# Patient Record
Sex: Female | Born: 1945 | Race: White | Hispanic: No | Smoking: Current some day smoker
Health system: Southern US, Community
[De-identification: ages and names within clinical notes are randomized; demographics above are authoritative.]

## PROBLEM LIST (undated history)

## (undated) DIAGNOSIS — Q211 Atrial septal defect, unspecified: Secondary | ICD-10-CM

## (undated) DIAGNOSIS — I255 Ischemic cardiomyopathy: Secondary | ICD-10-CM

## (undated) DIAGNOSIS — I219 Acute myocardial infarction, unspecified: Secondary | ICD-10-CM

## (undated) DIAGNOSIS — E119 Type 2 diabetes mellitus without complications: Secondary | ICD-10-CM

## (undated) DIAGNOSIS — I739 Peripheral vascular disease, unspecified: Secondary | ICD-10-CM

## (undated) DIAGNOSIS — E785 Hyperlipidemia, unspecified: Secondary | ICD-10-CM

## (undated) DIAGNOSIS — K759 Inflammatory liver disease, unspecified: Secondary | ICD-10-CM

## (undated) DIAGNOSIS — I2510 Atherosclerotic heart disease of native coronary artery without angina pectoris: Secondary | ICD-10-CM

## (undated) DIAGNOSIS — I10 Essential (primary) hypertension: Secondary | ICD-10-CM

## (undated) DIAGNOSIS — I509 Heart failure, unspecified: Secondary | ICD-10-CM

## (undated) DIAGNOSIS — Z87898 Personal history of other specified conditions: Secondary | ICD-10-CM

## (undated) DIAGNOSIS — Z8572 Personal history of non-Hodgkin lymphomas: Secondary | ICD-10-CM

## (undated) DIAGNOSIS — Z8541 Personal history of malignant neoplasm of cervix uteri: Secondary | ICD-10-CM

## (undated) HISTORY — DX: Hyperlipidemia, unspecified: E78.5

## (undated) HISTORY — PX: ASD AND VSD REPAIR: SHX257

## (undated) HISTORY — DX: Personal history of malignant neoplasm of cervix uteri: Z85.41

## (undated) HISTORY — DX: Atherosclerotic heart disease of native coronary artery without angina pectoris: I25.10

## (undated) HISTORY — DX: Atrial septal defect, unspecified: Q21.10

## (undated) HISTORY — DX: Personal history of non-Hodgkin lymphomas: Z85.72

## (undated) HISTORY — DX: Essential (primary) hypertension: I10

## (undated) HISTORY — PX: VEIN SURGERY: SHX48

## (undated) HISTORY — DX: Personal history of other specified conditions: Z87.898

## (undated) HISTORY — DX: Atrial septal defect: Q21.1

---

## 1969-06-14 DIAGNOSIS — K759 Inflammatory liver disease, unspecified: Secondary | ICD-10-CM

## 1969-06-14 HISTORY — DX: Inflammatory liver disease, unspecified: K75.9

## 2000-06-14 DIAGNOSIS — Z87898 Personal history of other specified conditions: Secondary | ICD-10-CM

## 2000-06-14 HISTORY — DX: Personal history of other specified conditions: Z87.898

## 2007-07-14 ENCOUNTER — Ambulatory Visit: Payer: Self-pay | Admitting: Cardiovascular Disease

## 2007-07-14 ENCOUNTER — Encounter: Payer: Self-pay | Admitting: Cardiovascular Disease

## 2007-07-14 ENCOUNTER — Ambulatory Visit: Payer: Self-pay | Admitting: Cardiology

## 2007-07-14 ENCOUNTER — Inpatient Hospital Stay (HOSPITAL_COMMUNITY): Admission: EM | Admit: 2007-07-14 | Discharge: 2007-07-16 | Payer: Self-pay | Admitting: Cardiovascular Disease

## 2007-08-18 ENCOUNTER — Ambulatory Visit: Payer: Self-pay | Admitting: Cardiology

## 2007-09-05 ENCOUNTER — Ambulatory Visit: Payer: Self-pay | Admitting: Cardiology

## 2007-11-08 ENCOUNTER — Ambulatory Visit: Payer: Self-pay | Admitting: Cardiology

## 2008-03-06 ENCOUNTER — Ambulatory Visit: Payer: Self-pay | Admitting: Cardiology

## 2008-04-29 ENCOUNTER — Ambulatory Visit: Payer: Self-pay | Admitting: Cardiology

## 2008-07-03 ENCOUNTER — Ambulatory Visit: Payer: Self-pay | Admitting: Cardiology

## 2009-01-01 DIAGNOSIS — I10 Essential (primary) hypertension: Secondary | ICD-10-CM

## 2009-01-01 DIAGNOSIS — E785 Hyperlipidemia, unspecified: Secondary | ICD-10-CM

## 2009-01-01 DIAGNOSIS — Z87898 Personal history of other specified conditions: Secondary | ICD-10-CM

## 2009-01-01 DIAGNOSIS — Z8541 Personal history of malignant neoplasm of cervix uteri: Secondary | ICD-10-CM

## 2009-01-01 DIAGNOSIS — Z8679 Personal history of other diseases of the circulatory system: Secondary | ICD-10-CM

## 2009-01-07 DIAGNOSIS — I2510 Atherosclerotic heart disease of native coronary artery without angina pectoris: Secondary | ICD-10-CM

## 2009-01-08 ENCOUNTER — Ambulatory Visit: Payer: Self-pay | Admitting: Cardiology

## 2009-05-16 ENCOUNTER — Encounter (INDEPENDENT_AMBULATORY_CARE_PROVIDER_SITE_OTHER): Payer: Self-pay | Admitting: *Deleted

## 2009-05-30 ENCOUNTER — Telehealth: Payer: Self-pay | Admitting: Cardiology

## 2009-07-09 ENCOUNTER — Ambulatory Visit: Payer: Self-pay | Admitting: Cardiology

## 2009-09-03 ENCOUNTER — Ambulatory Visit: Payer: Self-pay | Admitting: Cardiovascular Disease

## 2009-09-03 ENCOUNTER — Encounter: Payer: Self-pay | Admitting: Cardiology

## 2009-09-09 ENCOUNTER — Encounter (INDEPENDENT_AMBULATORY_CARE_PROVIDER_SITE_OTHER): Payer: Self-pay | Admitting: *Deleted

## 2009-12-22 ENCOUNTER — Ambulatory Visit: Payer: Self-pay | Admitting: Cardiology

## 2009-12-22 ENCOUNTER — Ambulatory Visit: Payer: Self-pay | Admitting: Cardiovascular Disease

## 2009-12-22 DIAGNOSIS — I2589 Other forms of chronic ischemic heart disease: Secondary | ICD-10-CM | POA: Insufficient documentation

## 2009-12-31 ENCOUNTER — Telehealth (INDEPENDENT_AMBULATORY_CARE_PROVIDER_SITE_OTHER): Payer: Self-pay | Admitting: *Deleted

## 2010-01-02 ENCOUNTER — Ambulatory Visit: Payer: Self-pay | Admitting: Cardiology

## 2010-01-05 ENCOUNTER — Encounter (INDEPENDENT_AMBULATORY_CARE_PROVIDER_SITE_OTHER): Payer: Self-pay | Admitting: *Deleted

## 2010-01-05 LAB — CONVERTED CEMR LAB
BUN: 12 mg/dL (ref 6–23)
Chloride: 102 meq/L (ref 96–112)
Eosinophils Relative: 1.6 % (ref 0.0–5.0)
Glucose, Bld: 163 mg/dL — ABNORMAL HIGH (ref 70–99)
HCT: 39.3 % (ref 36.0–46.0)
Lymphs Abs: 2.9 10*3/uL (ref 0.7–4.0)
MCV: 87.8 fL (ref 78.0–100.0)
Monocytes Absolute: 0.6 10*3/uL (ref 0.1–1.0)
Platelets: 286 10*3/uL (ref 150.0–400.0)
Potassium: 4.7 meq/L (ref 3.5–5.1)
WBC: 11.9 10*3/uL — ABNORMAL HIGH (ref 4.5–10.5)

## 2010-02-02 ENCOUNTER — Encounter: Payer: Self-pay | Admitting: Cardiology

## 2010-02-03 ENCOUNTER — Ambulatory Visit: Payer: Self-pay | Admitting: Cardiovascular Disease

## 2010-03-05 ENCOUNTER — Ambulatory Visit: Payer: Self-pay | Admitting: Cardiovascular Disease

## 2010-03-05 ENCOUNTER — Encounter: Payer: Self-pay | Admitting: Cardiology

## 2010-03-06 ENCOUNTER — Encounter (INDEPENDENT_AMBULATORY_CARE_PROVIDER_SITE_OTHER): Payer: Self-pay | Admitting: *Deleted

## 2010-03-26 ENCOUNTER — Telehealth: Payer: Self-pay | Admitting: Cardiology

## 2010-03-26 ENCOUNTER — Encounter (INDEPENDENT_AMBULATORY_CARE_PROVIDER_SITE_OTHER): Payer: Self-pay | Admitting: *Deleted

## 2010-04-13 ENCOUNTER — Telehealth: Payer: Self-pay | Admitting: Cardiology

## 2010-05-11 ENCOUNTER — Ambulatory Visit: Payer: Self-pay | Admitting: Cardiology

## 2010-05-15 LAB — CONVERTED CEMR LAB
AST: 28 units/L (ref 0–37)
Alkaline Phosphatase: 103 units/L (ref 39–117)
Bilirubin, Direct: 0.1 mg/dL (ref 0.0–0.3)
Cholesterol: 126 mg/dL (ref 0–200)
Total Bilirubin: 0.3 mg/dL (ref 0.3–1.2)
Total CHOL/HDL Ratio: 3

## 2010-07-14 NOTE — Letter (Signed)
Summary: Generic Letter  Architectural technologist, Main Office  [address]   [place], [place] [address]   Phone: [phone]  Fax: [phone]        January 05, 2010 MRN: [id number]    [facility] [address] [place], [place]  [address]    Dear Ms. Adkins,  We have been unable to reach you by phone at the contact number in your chart. We wanted to let you know that Dr. Juanda Chance has reviewed your most recent lab work, which was to check you electrolytes (sodium, potassium, and kidney function) and your blood counts (to look for anemia). All of your results were normal. If you have any questions, please contact our office.         Sincerely,  Sherri Rad, RN, BSN  This letter has been electronically signed by your physician.

## 2010-07-14 NOTE — Miscellaneous (Signed)
  Clinical Lists Changes  Observations: Added new observation of RESEARCHCAND: Cardiology (09/09/2009 17:47) Added new observation of RS STUDY: Pegasus Study (09/09/2009 17:47)      Research Study Name: [employer]

## 2010-07-14 NOTE — Progress Notes (Signed)
   Phone Note Outgoing Call   Details for Reason: Labs Summary of Call: Pt had a Pegasus study visit at Month 4. Pt had creatinine level drawn and was elevated to 1.3 from .88 in March 2011. I spoke to Dr. Juanda Chance and we will get a BMP on pt due to elevated creatinine and CBC with diff since pt having bruising and fatigue.I called pt to let her know and she will have labs drawn on 12/26/09 at 11:00am.

## 2010-07-14 NOTE — Letter (Signed)
Summary: Generic Letter  Architectural technologist, Main Office  [address]   [place], [place] [address]   Phone: [phone]  Fax: [phone]        March 26, 2010 MRN: [id number]    [facility] [address] [place], [place]  [address]    Dear Ms. Krauss,  I am sending this letter on behalf of Dr. Juanda Chance. He has been reviewing your chart and noted that we need to make a change in the simvastatin you are taking. There are new findings out from the FDA that patient's taking amlodipine should not take higher than 20mg  of simvastatin a day due to the risk for muscle problems. Please call our office so we may discuss this with you. The contact number we have on file for you states it is disconnected.         Sincerely,  Sherri Rad, RN, BSN  This letter has been electronically signed by your physician.

## 2010-07-14 NOTE — Miscellaneous (Signed)
  Clinical Lists Changes  Observations: Added new observation of RS STUDY: TRACER - study completion 07/08/09 (09/09/2009 11:28)      Research Study Name: TRACER - study completion 07/08/09

## 2010-07-14 NOTE — Progress Notes (Signed)
Summary: Med change    ---- Converted from flag ---- ---- 03/25/2010 2:45 PM, Lenoria Farrier, MD, [facility] wrote: On simvistatin.  Decrease 40 to 20 and repeant L/L 1 month ------------------------------  Phone Note Outgoing Call   Call placed by: Sherri Rad, RN, BSN,  March 26, 2010 10:11 AM Call placed to: Patient Summary of Call: I attempted to contact the pt, but the contact # we have listed for her is d/c'ed. Letter mailed to pt. Initial call taken by: Sherri Rad, RN, BSN,  March 26, 2010 10:12 AM

## 2010-07-14 NOTE — Assessment & Plan Note (Signed)
Summary: need appt sameday as research/saf      Allergies Added: NKDA   Primary Provider:  NONE   History of Present Illness: The patient is 65 years old and return for management of CAD. She drives all the way from the [place] for her visits. She had a bare-metal stent placement right coronary in 2001 with subtotally occluded. She was hospitalized in January of 2009 with a non-ST elevation MI and underwent stenting of the ostium of the LAD with a bare-metal stent. Her ejection fraction is 35-40%.  She has been doing very well over the past year with no chest pain shortness of breath or palpitations.  She is enrolled in the Pegasus trial and is randomized to ticagrelor versus placebo. She says after she takes a pill in about 15 minutes she'll have to sigh this only lasts a few seconds.  She lives in [place] and drives all the way here for both her research appointment and her visits.  Her other problems include hypertension, hyperlipidemia, and a history of lymphoma.  Current Medications (verified): 1)  Simvastatin 40 Mg Tabs (Simvastatin) .Marland Kitchen.. 1 Once Daily 2)  Nitroglycerin 0.4 Mg Subl (Nitroglycerin) .... As Directed 3)  Coreg 12.5 Mg Tabs (Carvedilol) .Marland Kitchen.. 1 Tab  Twice  Daily 4)  Norvasc 10 Mg Tabs (Amlodipine Besylate) .Marland Kitchen.. 1 Once Daily 5)  Pegasus Study Drug .... Takes 2 Pills Bid 6)  Aspirin 81mg  Qd  Allergies (verified): No Known Drug Allergies  Past History:  Past Medical History: Reviewed history from 01/07/2009 and no changes required. Current Problems:  ATRIAL SEPTAL DEFECT, HX OF (ICD-V12.59) NON-HODGKIN'S LYMPHOMA, HX OF (ICD-V10.79) HYPERTENSION (ICD-401.9) CERVICAL CANCER, HX OF (ICD-V10.41) HYPERLIPIDEMIA (ICD-272.4)  1. Coronary artery disease status post prior stenting of the right     coronary artery in 2001 and non-ST-elevation myocardial infarction     in January 2009, with a new bare-metal stent placed in the ostium     of the left anterior  descending coronary artery and the coronary     anatomy as described above with total occlusion of the previously     placed right coronary artery stent. 2. Ejection fraction 35-40%. 3. Hypertension. 4. History of non-Hodgkin lymphoma. 5. Hyperlipidemia. 6. History of cervical cancer. 7. Current smoker.  8.  Tracer Study  Review of Systems       ROS is negative except as outlined in HPI.   Vital Signs:  Patient profile:   65 year old female Height:      60 inches Weight:      171 pounds BMI:     33.52 Pulse rate:   74 / minute Resp:     18 per minute BP sitting:   136 / 72  (left arm)  Vitals Entered By: Marrion Coy, CNA (December 22, 2009 2:40 PM)  Physical Exam  Additional Exam:  Gen. Well-nourished, in no distress   Neck: No JVD, thyroid not enlarged, no carotid bruits Lungs: No tachypnea, clear without rales, rhonchi or wheezes Cardiovascular: Rhythm regular, PMI not displaced,  heart sounds  normal, no murmurs or gallops, no peripheral edema, pulses normal in all 4 extremities. Abdomen: BS normal, abdomen soft and non-tender without masses or organomegaly, no hepatosplenomegaly. MS: No deformities, no cyanosis or clubbing   Neuro:  No focal sns   Skin:  no lesions    Impression & Recommendations:  Problem # 1:  CAD, NATIVE VESSEL (ICD-414.01) She had previous bare-metal stent to the right coronary which is now  totally occluded. In 2009 she had a bare-metal stent to the LAD. Ejection fraction was 35-40% by last measured. He's had no chest pain is probably stable.  She is in the Pegasus trial and is being treated with ticagrelor or placebo.  Her updated medication list for this problem includes:    Nitroglycerin 0.4 Mg Subl (Nitroglycerin) .Marland Kitchen... As directed    Coreg 12.5 Mg Tabs (Carvedilol) .Marland Kitchen... 1 tab  twice  daily    Norvasc 10 Mg Tabs (Amlodipine besylate) .Marland Kitchen... 1 once daily  Orders: EKG w/ Interpretation (93000)  Problem # 2:  CARDIOMYOPATHY, ISCHEMIC  (ICD-414.8) She has an ischemic cardio myopathy with an ejection fraction of 35-40% by last measured. She euvolemic and has no symptoms of shortness of breath. We will get a repeat echo on her followup visit in one year with Dr. Deno Lunger. Her updated medication list for this problem includes:    Nitroglycerin 0.4 Mg Subl (Nitroglycerin) .Marland Kitchen... As directed    Coreg 12.5 Mg Tabs (Carvedilol) .Marland Kitchen... 1 tab  twice  daily    Norvasc 10 Mg Tabs (Amlodipine besylate) .Marland Kitchen... 1 once daily  Problem # 3:  HYPERTENSION (ICD-401.9) This is well-controlled on her current medications. Her updated medication list for this problem includes:    Coreg 12.5 Mg Tabs (Carvedilol) .Marland Kitchen... 1 tab  twice  daily    Norvasc 10 Mg Tabs (Amlodipine besylate) .Marland Kitchen... 1 once daily  Problem # 4:  HYPERLIPIDEMIA (ICD-272.4) She is currently on simvastatin. Need to check and see if she gets her lipid profile as part of her research studies. Her updated medication list for this problem includes:    Simvastatin 40 Mg Tabs (Simvastatin) .Marland Kitchen... 1 once daily  Patient Instructions: 1)  Your physician recommends that you schedule a follow-up appointment in: 1 year with Dr. Clifton James and same day ECHO 2)  Your physician recommends that you continue on your current medications as directed. Please refer to the Current Medication list given to you today. 3)  Your physician has requested that you have an echocardiogram IN 1 YEAR.  Echocardiography is a painless test that uses sound waves to create images of your heart. It provides your doctor with information about the size and shape of your heart and how well your heart's chambers and valves are working.  This procedure takes approximately one hour. There are no restrictions for this procedure. Prescriptions: NORVASC 10 MG TABS (AMLODIPINE BESYLATE) 1 once daily  #90 x 4   Entered by:   Lisabeth Devoid RN   Authorized by:   Lenoria Farrier, MD, [facility]   Signed by:   Lisabeth Devoid RN on 12/22/2009    Method used:   Electronically to        [facility] [phone]* (retail)       [address] [place], [place]  [address]       Ph: [phone]       Fax: [phone]   RxID:   [id number] COREG 12.5 MG TABS (CARVEDILOL) 1 TAB  TWICE  DAILY  #180 x 3   Entered by:   Lisabeth Devoid RN   Authorized by:   Lenoria Farrier, MD, [facility]   Signed by:   Lisabeth Devoid RN on 12/22/2009   Method used:   Electronically to        [phone]* (retail)       [phone]  [address] [place], [place]  [address]       Ph: [phone]       Fax: [phone]   RxID:   [id number] SIMVASTATIN 40 MG TABS (SIMVASTATIN) 1 once daily  #90 x 3   Entered by:   Lisabeth Devoid RN   Authorized by:   Lenoria Farrier, MD, [facility]   Signed by:   Lisabeth Devoid RN on 12/22/2009   Method used:   Electronically to        [facility] [phone]* (retail)       [address] [place], [place]  [address]       Ph: [phone]       Fax: [phone]   RxID:   [id number]

## 2010-07-14 NOTE — Miscellaneous (Addendum)
  Clinical Lists Changes       Comments Patient stopped Pegasus study drug on 01/01/10. Pt will continue in Pegasus Study by phone visits every 4 months.

## 2010-07-14 NOTE — Progress Notes (Signed)
Summary:  re meds   Phone Note Call from Patient Call back at [phone]   Caller: Patient Reason for Call: Talk to Nurse Summary of Call: pt is calling she receive a letter stating need to make a change in the simvastatin. Initial call taken by: Roe Coombs,  April 13, 2010 10:39 AM  Follow-up for Phone Call        Mrs. Froman has cut her simvastatin to 20mg  daily.  She is aware Dr. Juanda Chance and Herbert Seta are out of the office.  She will await call back from [place]. Mylo Red RN     Appended Document:  re meds I spoke with the pt. She will come around 05/11/10 for a repeat lipid/liver panel.

## 2010-10-27 NOTE — Assessment & Plan Note (Signed)
[facility]                            CARDIOLOGY OFFICE NOTE   Charlotte Salas, Charlotte Salas                       MRN:          [id number]  DATE:04/29/2008                            DOB:          May 27, 1946    PRIMARY CARE PHYSICIAN:  None.   CLINICAL HISTORY:  Charlotte Salas is a 65 year old and returns for  management of coronary heart disease.  [place]  She was hospitalized.  She has previously had a stent  placed to the right coronary artery in 2001.  She was hospitalized here  in January with a non-ST-elevation myocardial infarction and underwent  stenting of an ostial lesion of the LAD with a bare-metal stent.  Her  stent in the right coronary artery had completely occluded.  Her  ejection fraction was 35-40%.   She has done fairly well since that time.  She stopped the Plavix after  a month due to financial reasons.  She was enrolled in tracer study drug  and she has continued the tracer study drug.  She says she has had no  recent chest pain, shortness of breath, or palpitations.   Her past medical history is significant for hypertension and  hyperlipidemia.  She also has a history of an atrial septal defect in  1972 and she has a non-Hodgkin lymphoma and was treated with  chemotherapy.  She also a history of cervical cancer.  She is still  smoking.   Her current medications include:  1. Tracer study drug.  2. Simvastatin 40 mg daily.  3. Carvedilol 12.5 mg b.i.d.  4. Amlodipine 5 mg daily.  5. Aspirin 325 mg daily.   PHYSICAL EXAMINATION:  VITAL SIGNS:  Blood pressure is 145/85 and pulse  is 52 and regular.  NECK:  There is no venous distension.  The carotid pulses were full  without bruits.  CHEST:  Clear.  CARDIAC:  Rhythm was regular.  No murmurs or gallops.  ABDOMEN:  Soft with normal bowel sounds.  There is hepatosplenomegaly.  EXTREMITIES:  Peripheral pulse were equal and there is no pedal  edema.   Electrocardiogram showed possible inferior infarct and nonspecific ST-T  changes.   IMPRESSION:  1. Coronary artery disease status post prior stenting of the right      coronary artery in 2001 and non-ST-elevation myocardial infarction      in January 2009, with a new bare-metal stent placed in the ostium      of the left anterior descending coronary artery and the coronary      anatomy as described above with total occlusion of the previously      placed right coronary artery stent.  2. Ejection fraction 35-40%.  3. Hypertension.  4. History of non-Hodgkin lymphoma.  5. Hyperlipidemia.  6. History of cervical cancer.  7. Current smoker.   RECOMMENDATIONS:  I think Charlotte Salas is doing reasonably well from  standpoint of a heart.  Her blood pressure is up a little bit, although  she says it runs about 130 when she checked at  other places.  She would  rather not increase her medicines at present.  I talked to her about  working on continued smoking and told her that the chances of clogging  up the stent are greater as she continues to smoke.  We will plan to  continue her same medications and I will see her back in June.  She is  trying to get Medicare and Medicaid, which she will have when she turns  63.     Bruce Elvera Lennox Juanda Chance, MD, [facility]  Electronically Signed    BRB/MedQ  DD: 04/29/2008  DT: 04/30/2008  Job #: [id number]

## 2010-10-27 NOTE — Cardiovascular Report (Signed)
NAMEVALLORY, Charlotte Salas                ACCOUNT NO.:  [id number]   MEDICAL RECORD NO.:  [id number]          PATIENT TYPE:  INP   LOCATION:  2912                         FACILITY:  [facility]   PHYSICIAN:  Bruce R. Juanda Chance, MD, FACCDATE OF BIRTH:  11/28/1945   DATE OF PROCEDURE:  07/14/2007  DATE OF DISCHARGE:                            CARDIAC CATHETERIZATION   CLINICAL HISTORY:  Ms. Rawlinson is 65 years old and had a stent placed in  her right coronary artery in 2001.  She has not seen a doctor in a  couple of years.  She presented to [facility] with chest  pain, and subsequently her EKGs were thought to have shown some inferior  ST elevation, although she was painfree by that time.  The attending  physician at [facility] called Dr. Laural Benes, our cardiology  fellow, and he arranged transfer here.  Her troponins were 7.0.  Her ECG  on arrival showed what appeared to be an old diaphragmatic wall  infarction.   PROCEDURE:  The procedure was performed via the right femoral arteries  and arterial sheath and initially 5-French pre-formed coronary  catheters.  A front wall arterial __________ was performed, and  Omnipaque contrast was used.  At completion of diagnostic study, made a  decision to proceed with IVUS and possible intervention on the lesion at  the ostium of the LAD.   The patient had been loaded with aspirin and Plavix at [facility] and received chewable aspirin today.  We used a Q-3.5, 6-French  guiding catheter with side holes.  We passed a Pro-water wire down the  LAD without difficulty.  We then passed an Atlantis IVUS catheter across  the lesion, did automatic pullback.  This demonstrated that tight lesion  just distal to the first diagonal branch, with disease extending to the  ostium of the LAD.  Based on the findings, we decided to stent the  lesion with a 2.75 x 12-mm Vision stent.  We positioned the stent just  at the ostium of the LAD and  deployed this one inflation of 10  atmospheres for 20 seconds.  We then postdilated with a 3.0 x 8-mm  Quantum Maverick, performing two inflation up to 16 atmospheres for 20  seconds.  We then performed the final IVUS run.  The right femoral was  closed with Angio-Seal at the end of the procedure.  The patient  tolerated the procedure well, left the laboratory in satisfactory  condition.   RESULTS:  The left main coronary artery:  The main coronary artery was  free of disease.   The left anterior descending artery:  The left descending artery gave  rise to an early optional diagonal branch, two septal perforators and a  second diagonal branch, and then two more septal perforators.  There was  moderate calcification in the proximal to mid-LAD.  There was a 90%  stenosis in the proximal LAD,  just after the first optional diagonal  branch with disease extending to the ostium.  There was a 40% narrowing  in the mid-LAD and 30% in the proximal mid-LAD,  and 30% narrowing in the  mid-LAD, and there was 40% narrowing in the second diagonal branch.   The circumflex artery:  The circumflex artery gave rise to a marginal  branch and two posterolateral branches.  There was 80% stenosis in one  of the posterolateral branches.   The right coronary artery:  The right coronary artery was completely  occluded at its midportion, after right ventricular branch and after the  previously placed stent.  The distal right coronary was well  collateralized from left coronary artery and consisted of posterior  descending and two posterolateral branches.   The left ventriculogram:  The left ventriculogram performed in the RAO  objection showed severe inferior wall hypokinesis.  The anterolateral  wall out to the apex moved moderately well.  The estimated fraction was  35% to 40%.   Distal aortogram:  A distal aortogram was performed.  It showed patent  renal arteries.  There were diffuse irregularities in  the distal aorta  and iliacs but no major obstruction.   The aortic pressure was 137/71 with a mean of 98.  Left ventricular  pressure was 137/15.   Following stenting of the lesion near the ostium, the LAD stenosis  improved from 90% to 0%.   Intravascular ultrasound showed the distal reference lumen was about  2.75, and the proximal reference lumen was about 2.75.  Downstream,  there was a 360-degree calcified lesion, but this had dimensions of  about 2.5 x 2.0, and we did not feel that needed to be treated.  Following deployment of the stent, the stent was well expanded and well  opposed.  The transitions were good on both ends and the proximal edge  just crossed the circumflex ostium.  The minimal stent dimensions were  about 2.75.   CONCLUSION:  1. Coronary artery disease with recent non-ST-elevation myocardial      infarction with 90% stenosis in the proximal LAD near the ostium,      40% proximal to mid and 30% mid-stenosis in the LAD with 40%      narrowing in the second diagonal branch, 80% narrowing in      posterolateral branch of circumflex artery, and total occlusion of      mid-right coronary artery with inferior wall hypokinesis and      estimated fraction of 35% to 40%.  2. Successful IVUS-guided stenting of the lesion in the ostium of the      LAD using a Vision bare metal stent with improvement in sentinel      node narrowing from 90% 0%.   DISPOSITION:  The patient will obtain a further observation.  We chose a  bare metal stent, because the patient has no resources for Plavix and  lives far away, and compliance is a real concern.  Will need to make  sure that she can take Plavix for at least a month.      Bruce Elvera Lennox Juanda Chance, MD, [facility]  Electronically Signed     BRB/MEDQ  D:  07/14/2007  T:  07/15/2007  Job:  [id number]   cc:   Everardo Beals. Juanda Chance, MD, [facility]  Cardiopulmonary Lab

## 2010-10-27 NOTE — H&P (Signed)
NAMEJAYLEE, FREEZE                ACCOUNT NO.:  [id number]   MEDICAL RECORD NO.:  [id number]          PATIENT TYPE:  INP   LOCATION:  2912                         FACILITY:  [facility]   PHYSICIAN:  Noralyn Pick. Eden Emms, MD, FACCDATE OF BIRTH:  Apr 21, 1946   DATE OF ADMISSION:  07/14/2007  DATE OF DISCHARGE:                              HISTORY & PHYSICAL   The patient does not have a primary care doctor or a cardiologist.  She  will be a new patient to [facility].   CHIEF COMPLAINT:  Chest discomfort.  Transfer from [place],  [place] for non-ST elevation MI.   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  She went to  the [facility] Emergency Department and was basically pain-free by the  time she arrived.  She was seen there by the local cardiologist and set  up for a catheterization for tomorrow morning.  At that time her  troponin was 0.1.  Over the next several hours she continued to have  some mild ST depression on her EKG and her troponin climbed up to 8.1  and the night hospitalist on call became concerned about the patient and  thought that she might need an urgent catheterization and requested  transfer to [facility].  He was actually concerned for ST elevations  although the patient remained symptom-free.  I discussed the case with  Dr. Juanda Chance and we accepted the patient into the CCU.  Upon arrival to  the CCU, she is still symptom-free and is resting comfortably in bed.  The patient is originally from [place] but is visiting her sister  and a friend in [place].  EKG shows old inferior QWMI  with no ST  elevations.   PAST MEDICAL HISTORY:  1. Myocardial infarction in 2000 while living in [place].  She      says this was treated with a stent.  This was probably to the right      coronary artery based on inferior Q-waves on electrocardiogram.  2. Cervical cancer 30 years ago treated with heavy doses of radiation      therapy.  3. Non-Hodgkin's lymphoma diagnosed in approximately 2004, treated      with several rounds of chemotherapy followed by Rituxan.  4. Atrial septal defect surgically repaired in her 26s.   SOCIAL HISTORY:  1. Lives in [place] alone.  2. She smokes 1 pack a day of cigarettes.  3. No alcohol.  4. No drugs.   FAMILY HISTORY:  1. Mother died at age 44.  2. Father died in his 70s of leukemia.  3. The patient has 3 sisters and 1 brother, and one of her sisters had  an MI in her 23s.   ALLERGIES:  None.   MEDICINES:  She takes no prescription medicine.  She takes occasional  over-the-counter aspirin and Advil.  At the outside hospital she got:  1. Aspirin 162 mg.  2. Lovenox 1 mg/kg at approximately 2 p.m.  3. Plavix 300 mg.  4. Lopressor 25 mg.  5. She also received a Nitropatch.   REVIEW OF SYSTEMS:  Positive for headache, chest pain, nausea, abdominal  pain.  Of note, the patient says she has not slept well for the past 2  nights due to indigestion.   PHYSICAL EXAM:  Pulse 74, respiratory rate 18, blood pressure 121/56,  saturation 98% on 2L, temperature 36.6.  GENERAL:  This is a pleasant white lady in no distress resting  comfortably in bed, awake, alert and oriented x3.  PUPILS:  Equal, round and reactive.  Extraocular movements are intact.  MUCOUS MEMBRANES:  Moist.  DENTITION:  Good.  NECK:  Supple, no lymphadenopathy.  Neck veins are flat, no carotid  bruits, no thyromegaly.  Neck is supple.  CARDIAC EXAM:  Normal rate, regular rhythm, no murmurs.  Point of  maximal impulse is normal.  No gallop.  LUNGS:   Mild rales in the bases but mostly clear throughout the  remainder of the lung fields, no wheezing.  ABDOMEN:  Soft, nontender, nondistended.  EXTREMITIES:  No cyanosis, no edema, 2+ dorsalis pedis pulses  bilaterally.  SKIN:  No rash.  Skin is warm and dry, no petechiae, no clubbing or  edema.  NEUROLOGIC:  Affect is somewhat blunted but strength is 5/5 in all 4  extremities and facial expressions are symmetric.   DIAGNOSTIC TESTS:  Radiology:  Chest x-ray, per report, shows some  chronic scarring in the right base but otherwise unremarkable.   Series of electrocardiograms show sinus rhythm with a rate of 80 to 100  with inferior Q-waves and lateral and anterior ST and T-wave changes  consistent with ischemia.   LABS:  White blood cell 10, hemoglobin 14.8, platelets 270,000.  Sodium  134, potassium 4.2, chloride 100, bicarbonate 21, BUN 10, creatinine  0.9, albumin 3.9, BNP 168.  Initial troponin 0.1, next 3.68, next 8.16.  Initial CK MB 3.6, next 38.   IMPRESSION:  A 65 year old white female with prior inferior myocardial  infarction in 2000, treated with a stent probably to the right coronary  artery, who now presents with a non-ST elevation myocardial infarction  and an abnormal electrocardiogram.  The patient is symptom-free at this  time and hemodynamically stable.  The electrocardiogram shows some mild  T-wave inversions and mild ST depressions but no ST elevation.   PLAN:  The patient has been accepted into the CCU to Dr. Eden Emms.  She  lives in the [place] the [place] and will need to be set up with a  cardiologist there eventually.  We will give her 1 mg/kg of Lovenox now  and will give her an additional 300 mg of Plavix, another full-strength  aspirin and initiate low-dose Lopressor.  Will also place her on an  empiric statin and check a fasting lipid panel.  Will plan for  catheterization with probable intervention in the  morning.  She will be kept n.p.o. and  hydrated.  Will continue to cycle  cardiac enzymes and electrocardiograms.  Will check a transthoracic echo  to evaluate left ventricular function.  Will also check TSH and fasting  lipid panel.  The patient will receive counseling  on tobacco cessation.      Christell Faith, MD   Electronically Signed     ______________________________  Noralyn Pick. Eden Emms, MD, [facility]    NDL/MEDQ  D:  07/14/2007  T:  07/14/2007  Job:  [id number]

## 2010-10-27 NOTE — Discharge Summary (Signed)
Charlotte Salas, Charlotte Salas                ACCOUNT NO.:  [id number]   MEDICAL RECORD NO.:  [id number]          PATIENT TYPE:  INP   LOCATION:  2039                         FACILITY:  [facility]   PHYSICIAN:  Jesse Sans. Wall, MD, FACCDATE OF BIRTH:  [birth year]   DATE OF ADMISSION:  07/14/2007  DATE OF DISCHARGE:  07/25/2007                               DISCHARGE SUMMARY   DISCHARGE DIAGNOSIS:  Non-ST elevation myocardial infarction.   SECONDARY DIAGNOSES:  1. Coronary artery disease.  2. 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  3. Ongoing tobacco abuse.  4. History of medication non-adherence.  5. Hyperlipidemia.  6. Hypertension.  7. History of atrial septal defect, status post repair in 1972.  8. History of non-Hodgkin's lymphoma.  9. History of cervical cancer.   ALLERGIES:  NO KNOWN DRUG ALLERGIES.   PROCEDURE:  1. Left heart cardiac catheterization with successful PTI and stenting      of the ostial LAD with a 2.75 x 12 mm multi-Link Vision Baer metal      stent.  2. 2-D echocardiogram July 14, 2007 showing an ejection fraction of      25% to 35% with moderate diffuse LV hypokinesis and mid to distal      posterior wall akinesis.   HISTORY OF PRESENT ILLNESS:  A 65 year old Caucasian female with the  above problem list. She has prior history of coronary artery disease  status post myocardial infarction in 2000 or 2001 in [place],  treated with stent. She has not had cardiology or primary care followup  recently. She was in her usual state of health until July 14, 2007  when while working, she had the sudden onset of chest tightness with  left arm tingling, diaphoresis and headache, similar to previous  myocardial infarction. She presented to [facility] where she  was treated and became pain free. She was seen by cardiology there and  admitted for cardiac catheterization. Unfortunately, that evening, she  had recurrent discomfort and she was  transferred to [facility]  for further evaluation.   HOSPITAL COURSE:  Upon arrival to [facility], she was pain  free. She ruled in for non-ST elevation MI with a CK of 371, MB of 42.0,  and troponin I of 7.88. Arrangements were made for cardiac  catheterization, which took place on July 14, 2007 revealing a 90%  stenosis in the osteal LAD. She had an occluded mid right coronary  artery. The distal right was filled to the left to right collaterals.  The LAD was successfully stented with 2.75 x 12 mm multi-Link Vision  Baer metal stent. EF was 35% to 40%. Followup 2-D echocardiogram showing  ejection fraction of 25% to 35%. She was maintained on aspirin, Plavix,  beta blocker, and Statin therapy, which she has tolerated well. Her  blood pressures have been running low and thus, preventing [place] from  initiating ace inhibitor at this time. She has been enrolled in the  tracer study and will be discharged home on a tracer study drug. She  will be discharged home today  in satisfactory condition.   DISCHARGE LABORATORY DATA:  Hemoglobin 13.4, hematocrit 38.7. White  blood cell count 11.0. Platelets 240,000. MCV 86.7. Sodium 135,  potassium 4.1, chloride 105, CO2 24, BUN 12, creatinine 0.94, glucose  124, CK 116, MB 3.9, troponin I 2.71, calcium 8.9, magnesium 2.2. TSH  1.949.   DISPOSITION:  The patient is being discharged home today in good  condition.   FOLLOWUP:  1. I will arrange for followup with Dr. Juanda Chance in approximately 2 to 4      weeks.  2. She will also followup in our office for the tracer study.  3. She is advised to obtain primary care Macklyn Glandon at home, which is in      [place], [place].   DISCHARGE MEDICATIONS:  1. Tracer study drug as directed.  2. Aspirin 325 mg daily.  3. Plavix 75 mg daily.  4. Coreg 6.25 mg b.i.d.  5. Simvastatin 40 mg q.h.s.  6. Nitroglycerin 0.4 mg sublingual p.r.n. chest pain.   OUTSTANDING LABORATORY STUDIES:   None.   DURATION OF DISCHARGE ENCOUNTER:  40 minutes including physician time.      Nicolasa Ducking, ANP      Jesse Sans. Daleen Squibb, MD, [facility]  Electronically Signed    CB/MEDQ  D:  07/16/2007  T:  07/17/2007  Job:  [id number]   cc:   Everardo Beals. Juanda Chance, MD, [facility]

## 2010-10-27 NOTE — Assessment & Plan Note (Signed)
[facility]                            CARDIOLOGY OFFICE NOTE   [name], [name]                       MRN:          [id number]  DATE:11/08/2007                            DOB:          1945-10-24    CLINICAL HISTORY:  Charlotte Salas is a 65 year old who returns for  management of coronary heart disease.  [place] [address].  She was  visiting a friend in [place], went to [facility] with chest  pain and positive enzymes, was transferred to [facility] where she  underwent stenting of a ostial lesion in the LAD with a bare metal  stent.  She had done quite well since that time, has had no recurrent  chest pain, shortness breath or palpitations.  She was enrolled in the  tracer trial.  She took her Plavix for one month but could not afford to  continue it after that time and discontinued it.   PAST MEDICAL HISTORY:  1. Atrial septal defect in 1972.  2. History of non-Hodgkin's lymphoma which was treated with      chemotherapy.  3. History of cervical cancer.  4. Hypertension.  5. Hyperlipidemia.  6. She is a smoker but is trying to stop.  Scheduled to start patch      tomorrow.   CURRENT MEDICATIONS:  1. Tracer study drugs.  2. Simvastatin 40 mg daily.  3. Carvedilol 12.5 mg b.i.d.  4. Amlodipine 5 mg daily.  5. Aspirin 325 mg daily.   PHYSICAL EXAMINATION:  VITAL SIGNS:  Blood pressure 145/75, pulse 51 and  regular.  NECK:  There was no venous tension.  The carotid pulses were full  without bruits.  CHEST:  Was clear.  CARDIAC:  Rhythm was regular.  No murmurs or gallops.  ABDOMEN:  Soft without organomegaly.  Peripheral pulses were full.  No  peripheral edema.   IMPRESSION:  1. Coronary artery disease status post non-ST-elevation myocardial      infarction January 2009 with stenting of the ostial lesion of the      LAD with a bare metal stent, now  stable.  2. Ejection fraction 35-40%.  3. Hypertension.  4. History of non-Hodgkin's lymphoma.  5. Hyperlipidemia.  6. History of cervical cancer.  7. Current smoker.   RECOMMENDATIONS:  I think Charlotte Salas is doing fairly well.  She needs  to have a dental extraction.  I told her it would be much safer to stay  on aspirin and the tracer study drug through this.  I think she can cut  her aspirin back to 81 mg 5 days before the extraction and then resume  325 after that.  I sent a note with her to her dentist regarding this.  I will plan to see her back in follow-up in 6 months.  She will need a  follow-up of tracer study in 3 months.  We will get blood today as part  of the tracer study.     Bruce Elvera Lennox Juanda Chance, MD, [facility]  Electronically Signed    BRB/MedQ  DD: 11/08/2007  DT: 11/08/2007  Job #: [id number]

## 2010-10-27 NOTE — Assessment & Plan Note (Signed)
[facility]                            CARDIOLOGY OFFICE NOTE   [name], [name]                       MRN:          [id number]  DATE:09/05/2007                            DOB:          November 20, 1945    PRIMARY CARE PHYSICIAN:  None.   HISTORY:  [name] (pronounced Howsar), is 65 years old and returned  for management of her coronary artery disease.  She was visiting a  friend in [place] and went to [facility] with chest pain and  was transferred to [place] and had positive enzymes consistent with a non-ST  myocardial infarction.  She underwent stenting of a proximal and ostial  lesion in the LAD.  We used a bare metal stent because of __________.  She has done quite well since that time and has had no recurrent chest  pain.  She was able to take Plavix for one month but then could not  afford to continue it after that time.  She has had no chest pain or  palpitations or shortness of breath.   PAST MEDICAL HISTORY:  1. Significant for atrial septal defect repair in 1972.  2. She has a history of non-Hodgkin's lymphoma.  3. History of cervical cancer.  4. Hypertension.  5. Hyperlipidemia.  6. She also is a smoker.   NOTATION:  She was enrolled in our TRACER study in the hospital.   MEDICATIONS:  1. TRACER study drug.  2. Simvastatin __________  3. __________ one puff __________  4. __________   PHYSICAL EXAMINATION:  __________  NECK:  There was no venous distention.  The carotid pulses were full  without bruits.  CHEST:  Clear.  HEART:  Rhythm was regular.  I could hear no murmurs or gallops.  ABDOMEN:  Soft, normal bowel sounds.  No hepatosplenomegaly.  EXTREMITIES:  Right femoral site was well-healed.  There is no  peripheral edema.  Pedal pulses equal.   Electrocardiogram showed an old __________ wall infarction.   IMPRESSION:  1. __________ non-ST segment myocardial infarction with improvement      with proton pump  inhibitor __________ in the left anterior      descending coronary artery with residual total occlusion in the mid-      right coronary artery.  2. Ejection fraction 35%-40% compatible with __________.  3. Hypertension, under good control.  4. Non-Hodgkin's lymphoma.  5. Status post __________  6. __________   RECOMMENDATIONS:  I think Charlotte Salas is doing well.  Unfortunately she  has not got insurance.  She is waiting for Medicare.  Her blood pressure  is quite high today and I am going to start her on Norvasc and increase  her Coreg 12.5 mg twice daily.  I will try to coordinate her visits with  research here and eventually will get her to a doctor in [place]  __________.   SOCIAL HISTORY:  She  lives by herself.     Bruce Elvera Lennox Juanda Chance, MD, [facility]  Electronically Signed    BRB/MedQ  DD: 09/05/2007  DT: 09/05/2007  Job #: [id number]

## 2010-11-19 ENCOUNTER — Encounter: Payer: Self-pay | Admitting: Cardiovascular Disease

## 2010-12-24 ENCOUNTER — Encounter: Payer: Self-pay | Admitting: Cardiovascular Disease

## 2010-12-29 ENCOUNTER — Encounter: Payer: Self-pay | Admitting: Cardiovascular Disease

## 2010-12-29 ENCOUNTER — Ambulatory Visit (INDEPENDENT_AMBULATORY_CARE_PROVIDER_SITE_OTHER): Payer: Medicare Other | Admitting: Cardiovascular Disease

## 2010-12-29 VITALS — BP 145/67 | HR 54 | Wt 166.8 lb

## 2010-12-29 DIAGNOSIS — I2510 Atherosclerotic heart disease of native coronary artery without angina pectoris: Secondary | ICD-10-CM

## 2010-12-29 DIAGNOSIS — I2589 Other forms of chronic ischemic heart disease: Secondary | ICD-10-CM

## 2010-12-29 NOTE — Patient Instructions (Signed)
Your physician recommends that you schedule a follow-up appointment in: 1 year  Your physician has requested that you have an echocardiogram. Echocardiography is a painless test that uses sound waves to create images of your heart. It provides your doctor with information about the size and shape of your heart and how well your heart's chambers and valves are working. This procedure takes approximately one hour. There are no restrictions for this procedure.    

## 2010-12-29 NOTE — Assessment & Plan Note (Signed)
Stable No changes 

## 2010-12-29 NOTE — Assessment & Plan Note (Signed)
LVEF of 35-40% by echo several years ago. Repeat echo this summer. Continue current meds.

## 2010-12-29 NOTE — Progress Notes (Signed)
History of Present Illness:65 yo WF with history of CAD, HTN, hyperlipidemia, andNon-Hodgkins lymphoma here today for cardiac follow up. She has been followed in the past by Dr. Juanda Chance.  She drives all the way from the [place] for her visits. She had a bare-metal stent placement right coronary in 2001 with subtotally occluded. She was hospitalized in January of 2009 with a non-ST elevation MI and underwent stenting of the ostium of the LAD with a bare-metal stent. Her ejection fraction is 35-40%.  She has been doing very well over the past year with no chest pain, shortness of breath or palpitations. She has been inactive. She has chronic hip pain with walking.   She has no primary care doctor.     Past Medical History  Diagnosis Date  . Atrial septal defect     hx of it  . Personal history of other lymphatic and hematopoietic neoplasm     hodgkins lymphoma  . HTN (hypertension)   . Personal history of malignant neoplasm of cervix uteri   . Other and unspecified hyperlipidemia   . CAD (coronary artery disease)     s/p prior stenting of the right coronary artery in '01 and non-ST-elevation myocardial infarction in jan '09, with a new bare-metal stent placed in the ostium of the left anterior descending coronary artert and the coronary anatomy as described above with total occlusion of the previously placed right coronary artery stent. EF 35-40%  . HTN (hypertension)   . History of non-Hodgkin's lymphoma   . Hyperlipidemia   . History of cervical cancer     Past Surgical History  Procedure Date  . Asd and vsd repair     Current Outpatient Prescriptions  Medication Sig Dispense Refill  . amLODipine (NORVASC) 10 MG tablet Take 10 mg by mouth daily.        Marland Kitchen aspirin 325 MG tablet Take 325 mg by mouth daily.        . carvedilol (COREG) 12.5 MG tablet Take 12.5 mg by mouth 2 (two) times daily.        . simvastatin (ZOCOR) 40 MG tablet Take 20 mg by mouth daily.       . nitroGLYCERIN  (NITROSTAT) 0.4 MG SL tablet Place 0.4 mg under the tongue every 5 (five) minutes as needed.          Allergies not on file  History   Social History  . Marital Status: Divorced    Spouse Name: N/A    Number of Children: N/A  . Years of Education: N/A   Occupational History  . Not on file.   Social History Main Topics  . Smoking status: Current Everyday Smoker  . Smokeless tobacco: Not on file   Comment: 1 ppd  . Alcohol Use: No  . Drug Use: Not on file  . Sexually Active: Not on file   Other Topics Concern  . Not on file   Social History Narrative   Lives in [place], [place] alone.Tracer study.     No family history on file.  Review of Systems:  As stated in the HPI and otherwise negative.   BP 145/67  Pulse 81  Wt 166 lb 12.8 oz (75.66 kg)  Physical Examination: General: Well developed, well nourished, NAD HEENT: OP clear, mucus membranes moist SKIN: warm, dry. No rashes. Neuro: No focal deficits Musculoskeletal: Muscle strength 5/5 all ext Psychiatric: Mood and affect normal Neck: No JVD, no carotid bruits, no thyromegaly, no lymphadenopathy. Lungs:Clear bilaterally, no  wheezes, rhonci, crackles Cardiovascular: Regular rate and rhythm. No murmurs, gallops or rubs. Abdomen:Soft. Bowel sounds present. Non-tender.  Extremities: No lower extremity edema. Pulses are 2 + in the bilateral DP/PT.  WGN:FAOZH brady, rate 54 bpm. Inferior infarct. T wave changes.

## 2011-01-27 ENCOUNTER — Ambulatory Visit (HOSPITAL_COMMUNITY): Payer: Medicare Other | Attending: Cardiovascular Disease | Admitting: Radiology

## 2011-01-27 DIAGNOSIS — I2510 Atherosclerotic heart disease of native coronary artery without angina pectoris: Secondary | ICD-10-CM | POA: Insufficient documentation

## 2011-01-27 DIAGNOSIS — Z87898 Personal history of other specified conditions: Secondary | ICD-10-CM | POA: Insufficient documentation

## 2011-01-27 DIAGNOSIS — I10 Essential (primary) hypertension: Secondary | ICD-10-CM | POA: Insufficient documentation

## 2011-01-27 DIAGNOSIS — F17200 Nicotine dependence, unspecified, uncomplicated: Secondary | ICD-10-CM | POA: Insufficient documentation

## 2011-01-27 DIAGNOSIS — E785 Hyperlipidemia, unspecified: Secondary | ICD-10-CM | POA: Insufficient documentation

## 2011-01-27 DIAGNOSIS — I252 Old myocardial infarction: Secondary | ICD-10-CM | POA: Insufficient documentation

## 2011-01-27 DIAGNOSIS — I2589 Other forms of chronic ischemic heart disease: Secondary | ICD-10-CM

## 2011-01-27 DIAGNOSIS — E669 Obesity, unspecified: Secondary | ICD-10-CM | POA: Insufficient documentation

## 2011-03-01 ENCOUNTER — Telehealth: Payer: Self-pay | Admitting: Cardiovascular Disease

## 2011-03-01 MED ORDER — CARVEDILOL 12.5 MG PO TABS: 12.5000 mg | ORAL_TABLET | Freq: Two times a day (BID) | ORAL | Status: DC

## 2011-03-01 MED ORDER — AMLODIPINE BESYLATE 10 MG PO TABS: 10.0000 mg | ORAL_TABLET | Freq: Every day | ORAL | Status: DC

## 2011-03-01 NOTE — Telephone Encounter (Signed)
Refill carvedilol and amlodipine needed, uses [facility] [address]

## 2011-03-04 LAB — CARDIAC PANEL(CRET KIN+CKTOT+MB+TROPI)
CK, MB: 10.3 — ABNORMAL HIGH
CK, MB: 42 — ABNORMAL HIGH
Relative Index: 11.3 — ABNORMAL HIGH
Relative Index: 6.1 — ABNORMAL HIGH
Relative Index: 6.3 — ABNORMAL HIGH
Total CK: 168
Total CK: 182 — ABNORMAL HIGH
Total CK: 371 — ABNORMAL HIGH
Troponin I: 2.79
Troponin I: 3.64
Troponin I: 3.95

## 2011-03-04 LAB — BASIC METABOLIC PANEL
CO2: 27
Calcium: 9.1
Chloride: 105
GFR calc Af Amer: >60
GFR calc Af Amer: >60
GFR calc non Af Amer: >60
Potassium: 4.1
Potassium: 4.4
Sodium: 135
Sodium: 137

## 2011-03-04 LAB — CBC
MCHC: 34.3
Platelets: 241
RBC: 4.35
RDW: 13.4
WBC: 10.3

## 2011-03-04 LAB — APTT: aPTT: 30

## 2011-03-04 LAB — LIPID PANEL
Cholesterol: 167
LDL Cholesterol: 91
Triglycerides: 239 — ABNORMAL HIGH

## 2011-03-04 LAB — PROTIME-INR: Prothrombin Time: 12.5

## 2011-03-04 LAB — DIFFERENTIAL
Lymphocytes Relative: 30
Lymphs Abs: 3.1
Neutrophils Relative %: 61

## 2011-03-05 LAB — CBC
HCT: 38.7
Platelets: 240
WBC: 11 — ABNORMAL HIGH

## 2011-03-05 LAB — CARDIAC PANEL(CRET KIN+CKTOT+MB+TROPI)
Relative Index: 3.4 — ABNORMAL HIGH
Troponin I: 2.71

## 2012-01-28 ENCOUNTER — Encounter: Payer: Self-pay | Admitting: Cardiovascular Disease

## 2012-01-28 ENCOUNTER — Ambulatory Visit (INDEPENDENT_AMBULATORY_CARE_PROVIDER_SITE_OTHER): Payer: Medicare Other | Admitting: Cardiovascular Disease

## 2012-01-28 VITALS — BP 129/71 | HR 65 | Ht 60.0 in | Wt 169.0 lb

## 2012-01-28 DIAGNOSIS — F17200 Nicotine dependence, unspecified, uncomplicated: Secondary | ICD-10-CM

## 2012-01-28 DIAGNOSIS — I2589 Other forms of chronic ischemic heart disease: Secondary | ICD-10-CM

## 2012-01-28 DIAGNOSIS — I2510 Atherosclerotic heart disease of native coronary artery without angina pectoris: Secondary | ICD-10-CM

## 2012-01-28 DIAGNOSIS — Z720 Tobacco use: Secondary | ICD-10-CM | POA: Insufficient documentation

## 2012-01-28 MED ORDER — CARVEDILOL 12.5 MG PO TABS: 12.5000 mg | ORAL_TABLET | Freq: Two times a day (BID) | ORAL | Status: DC

## 2012-01-28 MED ORDER — AMLODIPINE BESYLATE 10 MG PO TABS: 10.0000 mg | ORAL_TABLET | Freq: Every day | ORAL | Status: DC

## 2012-01-28 MED ORDER — LISINOPRIL 5 MG PO TABS: 5.0000 mg | ORAL_TABLET | Freq: Every day | ORAL | Status: DC

## 2012-01-28 MED ORDER — SIMVASTATIN 40 MG PO TABS: 20.0000 mg | ORAL_TABLET | Freq: Every day | ORAL | Status: DC

## 2012-01-28 MED ORDER — NITROGLYCERIN 0.4 MG SL SUBL: 0.4000 mg | SUBLINGUAL_TABLET | SUBLINGUAL | Status: DC | PRN

## 2012-01-28 NOTE — Patient Instructions (Addendum)
Your physician wants you to follow-up in:  12 months.  You will receive a reminder letter in the mail two months in advance. If you don't receive a letter, please call our office to schedule the follow-up appointment.  Your physician has requested that you have a lexiscan myoview. For further information please visit [email or web address] Please follow instruction sheet, as given.   Your physician has recommended you make the following change in your medication:  Start lisinopril 5 mg by mouth daily

## 2012-01-28 NOTE — Assessment & Plan Note (Signed)
Last LVEF 50% by echo 2012. Continue beta blocker. She has not been on an Ace-inhibitor in the past. Will start Lisinopril 5 mg po Qdaily.

## 2012-01-28 NOTE — Assessment & Plan Note (Addendum)
She has had no chest pain. She is now 4.5 years out from last bare metal stent in the LAD. No recent stress testing. Given her bare metal stent in the LAD with ongoing tobacco abuse, I think stress testing is indicated. She cannot walk on a treadmill. Will arrange Lexiscan myoview to exclude ischemia. No changes in medications. I recommended ASA 81 mg po Qdaily. Continue beta blocker, statin. BP well controlled on amlodipine.

## 2012-01-28 NOTE — Assessment & Plan Note (Signed)
Cessation encouraged 

## 2012-01-28 NOTE — Progress Notes (Signed)
History of Present Illness: 66 yo WF with history of CAD, HTN, hyperlipidemia, and Non-Hodgkins lymphoma here today for cardiac follow up. She has been followed in the past by Dr. Juanda Chance. She drives all the way from the [place] for her visits. She had bare-metal stent placement right coronary in 2001. She was hospitalized in January of 2009 with a non-ST elevation MI and underwent stenting of the ostium of the LAD with a bare-metal stent. Her ejection fraction is 35-40%. I last saw her in July 2012.   She has been doing very well over the past year with no chest pain, shortness of breath or palpitations. She had a bad sinus infection in May. She has been inactive. She has chronic bilateral hip pain with walking so she does not walk much.   She has no primary care doctor. She does not wish to have any routine screening and does not want a primary care doctor.   Last Lipid Profile:  Lipid Panel     Component Value Date/Time   CHOL 126 05/11/2010 1116   TRIG 208.0* 05/11/2010 1116   HDL 36.70* 05/11/2010 1116   CHOLHDL 3 05/11/2010 1116   VLDL 41.6* 05/11/2010 1116   LDLCALC  Value: 91        Total Cholesterol/HDL:CHD Risk Coronary Heart Disease Risk Table                     Men   Women  1/2 Average Risk   3.4   3.3 07/14/2007 0545     Past Medical History  Diagnosis Date  . Atrial septal defect     hx of it  . Personal history of other lymphatic and hematopoietic neoplasm     hodgkins lymphoma  . HTN (hypertension)   . Personal history of malignant neoplasm of cervix uteri   . Other and unspecified hyperlipidemia   . CAD (coronary artery disease)     s/p prior stenting of the right coronary artery in '01 and non-ST-elevation myocardial infarction in jan '09, with a new bare-metal stent placed in the ostium of the left anterior descending coronary artert and the coronary anatomy as described above with total occlusion of the previously placed right coronary artery stent. EF 35-40%  .  HTN (hypertension)   . History of non-Hodgkin's lymphoma   . Hyperlipidemia   . History of cervical cancer     Past Surgical History  Procedure Date  . Asd and vsd repair     Current Outpatient Prescriptions  Medication Sig Dispense Refill  . amLODipine (NORVASC) 10 MG tablet Take 1 tablet (10 mg total) by mouth daily.  90 tablet  3  . aspirin 325 MG tablet Take 325 mg by mouth daily.        . carvedilol (COREG) 12.5 MG tablet Take 1 tablet (12.5 mg total) by mouth 2 (two) times daily.  180 tablet  3  . nitroGLYCERIN (NITROSTAT) 0.4 MG SL tablet Place 0.4 mg under the tongue every 5 (five) minutes as needed.        . simvastatin (ZOCOR) 40 MG tablet Take 20 mg by mouth daily.         No Known Allergies  History   Social History  . Marital Status: Divorced    Spouse Name: N/A    Number of Children: N/A  . Years of Education: N/A   Occupational History  . Not on file.   Social History Main Topics  . Smoking  status: Current Everyday Smoker -- 1.0 packs/day for 50 years    Types: Cigarettes  . Smokeless tobacco: Current User    Types: Chew   Comment: 1 ppd  . Alcohol Use: No  . Drug Use: No  . Sexually Active: Not on file   Other Topics Concern  . Not on file   Social History Narrative   Lives in [place], [place] alone.Tracer study.     No family history on file.  Review of Systems:  As stated in the HPI and otherwise negative.   BP 129/71  Pulse 65  Ht 5' (1.524 m)  Wt 169 lb (76.658 kg)  BMI 33.01 kg/m2  Physical Examination: General: Well developed, well nourished, NAD HEENT: OP clear, mucus membranes moist SKIN: warm, dry. No rashes. Neuro: No focal deficits Musculoskeletal: Muscle strength 5/5 all ext Psychiatric: Mood and affect normal Neck: No JVD, no carotid bruits, no thyromegaly, no lymphadenopathy. Lungs:Clear bilaterally, no wheezes, rhonci, crackles Cardiovascular: Regular rate and rhythm. No murmurs, gallops or rubs. Abdomen:Soft. Bowel  sounds present. Non-tender.  Extremities: No lower extremity edema. Pulses are 2 + in the bilateral DP/PT.  Echo 01/27/11: Left ventricle: The cavity size was normal. Wall thickness was normal. Systolic function was mildly reduced. The estimated ejection fraction was in the range of 45% to 50%. Diffuse hypokinesis. There is severe hypokinesis of the posterior wall. Doppler parameters are consistent with abnormal left ventricular relaxation (grade 1 diastolic dysfunction). - Left atrium: The atrium was mildly dilated. - Atrial septum: There was redundancy of the septum, with borderline criteria for aneurysm.

## 2012-01-31 LAB — LIPID PANEL
HDL: 44.6 mg/dL (ref >39.00)
VLDL: 33.2 mg/dL (ref 0.0–40.0)

## 2012-01-31 LAB — HEPATIC FUNCTION PANEL
ALT: 29 U/L (ref 0–35)
Total Bilirubin: 0.7 mg/dL (ref 0.3–1.2)
Total Protein: 7.2 g/dL (ref 6.0–8.3)

## 2012-02-02 ENCOUNTER — Telehealth: Payer: Self-pay | Admitting: Cardiovascular Disease

## 2012-02-02 NOTE — Telephone Encounter (Signed)
Pt called with lab results/ result note done.

## 2012-02-02 NOTE — Telephone Encounter (Signed)
PT RTN CALL TO PAT FROM YESTERDAY, PLS CALL CELL

## 2012-02-08 ENCOUNTER — Ambulatory Visit (HOSPITAL_COMMUNITY): Payer: Medicare Other | Attending: Cardiology | Admitting: Radiology

## 2012-02-08 VITALS — BP 105/54 | Ht 60.0 in | Wt 168.0 lb

## 2012-02-08 DIAGNOSIS — F17200 Nicotine dependence, unspecified, uncomplicated: Secondary | ICD-10-CM | POA: Insufficient documentation

## 2012-02-08 DIAGNOSIS — I999 Unspecified disorder of circulatory system: Secondary | ICD-10-CM | POA: Insufficient documentation

## 2012-02-08 DIAGNOSIS — R0989 Other specified symptoms and signs involving the circulatory and respiratory systems: Secondary | ICD-10-CM | POA: Insufficient documentation

## 2012-02-08 DIAGNOSIS — R0609 Other forms of dyspnea: Secondary | ICD-10-CM | POA: Insufficient documentation

## 2012-02-08 DIAGNOSIS — I2510 Atherosclerotic heart disease of native coronary artery without angina pectoris: Secondary | ICD-10-CM

## 2012-02-08 DIAGNOSIS — E785 Hyperlipidemia, unspecified: Secondary | ICD-10-CM

## 2012-02-08 DIAGNOSIS — I10 Essential (primary) hypertension: Secondary | ICD-10-CM | POA: Insufficient documentation

## 2012-02-08 DIAGNOSIS — R002 Palpitations: Secondary | ICD-10-CM | POA: Insufficient documentation

## 2012-02-08 DIAGNOSIS — R0602 Shortness of breath: Secondary | ICD-10-CM

## 2012-02-08 MED ORDER — REGADENOSON 0.4 MG/5ML IV SOLN
0.4000 mg | Freq: Once | INTRAVENOUS | Status: AC
  Administered 2012-02-08: 0.4 mg via INTRAVENOUS

## 2012-02-08 MED ORDER — TECHNETIUM TC 99M TETROFOSMIN IV KIT
30.0000 | PACK | Freq: Once | INTRAVENOUS | Status: AC | PRN
  Administered 2012-02-08: 30 via INTRAVENOUS

## 2012-02-08 MED ORDER — TECHNETIUM TC 99M TETROFOSMIN IV KIT
10.0000 | PACK | Freq: Once | INTRAVENOUS | Status: AC | PRN
  Administered 2012-02-08: 10 via INTRAVENOUS

## 2012-02-08 NOTE — Progress Notes (Signed)
[facility] SITE 3 NUCLEAR MED [address] [place] [address] [phone]  Cardiology Nuclear Med Study  Charlotte Salas is a 66 y.o. female     MRN : [id number]     DOB: 23-Apr-1946  Procedure Date: 02/08/2012  Nuclear Med Background Indication for Stress Test:  Evaluation for Ischemia and Stent Patency History: ASD Repair, A/S Defect, '01 Stents: RCA '04 GXT: [place] N[place] per pt, '09 MI- NSTEMI-Heart Cath: RCA occluded with collaterals EF; 35-40% mod Dz CFX-Stents-LAD 8/12 ECHO: EF: 45-50%   Cardiac Risk Factors: Family History - CAD, Hypertension, Lipids and Smoker  Symptoms:  DOE, Palpitations and SOB   Nuclear Pre-Procedure Caffeine/Decaff Intake:  8:00pm NPO After: 7:00pm   Lungs:  clear O2 Sat: 95% on room air. IV 0.9% NS with Angio Cath:  20g  IV Site: R Hand  IV Started by:  Cathlyn Parsons, RN  Chest Size (in):  38 Cup Size: B  Height: 5' (1.524 m)  Weight:  168 lb (76.204 kg)  BMI:  Body mass index is 32.81 kg/(m^2). Tech Comments:  Coreg taken at 0700    Nuclear Med Study 1 or 2 day study: 1 day  Stress Test Type:  Treadmill/Lexiscan  Reading MD: Marca Ancona, MD  Order Authorizing Provider:  Tedra Senegal  Resting Radionuclide: Technetium 13m Tetrofosmin  Resting Radionuclide Dose: 10.8 mCi   Stress Radionuclide:  Technetium 51m Tetrofosmin  Stress Radionuclide Dose: 32.8 mCi           Stress Protocol Rest HR: 48 Stress HR: 74  Rest BP: 105/54 Stress BP: 120/50  Exercise Time (min): n/a METS: n/a   Predicted Max HR: 152 bpm % Max HR: 48.68 bpm Rate Pressure Product: 8880   Dose of Adenosine (mg):  n/a Dose of Lexiscan: 0.4 mg  Dose of Atropine (mg): n/a Dose of Dobutamine: n/a mcg/kg/min (at max HR)  Stress Test Technologist: Milana Na, EMT-P  Nuclear Technologist:  Domenic Polite, CNMT     Rest Procedure:  Myocardial perfusion imaging was performed at rest 45 minutes following the intravenous administration of  Technetium 78m Tetrofosmin. Rest ECG: Sinus Bradycardia with PVCS   Stress Procedure:  The patient received IV Lexiscan 0.4 mg over 15-seconds with concurrent low level exercise and then Technetium 2m Tetrofosmin was injected at 30-seconds while the patient continued walking one more minute. There were no significant changes, sob, occ pvcs/rare pac and abdominal pain with Lexiscan. Quantitative spect images were obtained after a 45-minute delay. Stress ECG: No significant change from baseline ECG  QPS Raw Data Images:  Normal; no motion artifact; normal heart/lung ratio. Stress Images:  Medium, moderate basal to mid inferior and inferolateral perfusiond defect.  Rest Images:  Small, mild-moderate basal inferior and inferolateral perfusion defect.  Subtraction (SDS):  Partially reversible basal to mid inferior and inferolateral perfusion defect.  Transient Ischemic Dilatation (Normal <1.22):  1.12 Lung/Heart Ratio (Normal <0.45):  0.43  Quantitative Gated Spect Images QGS EDV:  125 ml QGS ESV:  75 ml  Impression Exercise Capacity:  Lexiscan with no exercise. BP Response:  Hypotensive blood pressure response. Clinical Symptoms:  Short of breath ECG Impression:  No significant ST segment change suggestive of ischemia. Comparison with Prior Nuclear Study: No images to compare  Overall Impression:  Moderate risk study.  There is a medium, moderate partially reversible basal to mid inferior and inferolateral perfusion defect suggesting infarct with peri-infarct ischemia.  Corresponding inferior and inferolateral wall motion abnormality.   LV  Ejection Fraction: 40%.  LV Wall Motion:  Inferior and inferolateral hypokinesis.   Marca Ancona 02/08/2012

## 2012-02-15 ENCOUNTER — Ambulatory Visit (INDEPENDENT_AMBULATORY_CARE_PROVIDER_SITE_OTHER): Payer: Medicare Other | Admitting: Cardiovascular Disease

## 2012-02-15 ENCOUNTER — Encounter: Payer: Self-pay | Admitting: Cardiovascular Disease

## 2012-02-15 VITALS — BP 138/65 | HR 50 | Resp 18 | Ht 60.0 in | Wt 171.4 lb

## 2012-02-15 DIAGNOSIS — F17200 Nicotine dependence, unspecified, uncomplicated: Secondary | ICD-10-CM

## 2012-02-15 DIAGNOSIS — I2589 Other forms of chronic ischemic heart disease: Secondary | ICD-10-CM

## 2012-02-15 DIAGNOSIS — I25810 Atherosclerosis of coronary artery bypass graft(s) without angina pectoris: Secondary | ICD-10-CM

## 2012-02-15 DIAGNOSIS — Z720 Tobacco use: Secondary | ICD-10-CM

## 2012-02-15 DIAGNOSIS — I255 Ischemic cardiomyopathy: Secondary | ICD-10-CM

## 2012-02-15 NOTE — Progress Notes (Signed)
History of Present Illness: 66 yo WF with history of CAD, HTN, hyperlipidemia, and Non-Hodgkins lymphoma here today for cardiac follow up. She has been followed in the past by Dr. Juanda Chance. She drives all the way from the [place] for her visits. She had bare-metal stent placement right coronary in 2001. She was hospitalized in January of 2009 with a non-ST elevation MI and underwent stenting of the ostium of the LAD with a bare-metal stent. Her ejection fraction is 35-40%. RCA is chronically occluded with good collaterals.  I saw her 01/28/12 and she told me that she continued to smoke daily. A Lexiscan myoview was arranged given her history of ostial LAD bare metal stenting and no recent ischemic workup. Her myoview on 02/08/12 showed a medium, moderate partially reversible basal to mid inferior and inferolateral perfusion defect suggesting infarct with peri-infarct ischemia. Corresponding inferior and inferolateral wall motion abnormality. LV Ejection Fraction: 40%. LV Wall Motion: Inferior and inferolateral hypokinesis.   She is here to follow up on her stress test. She is feeling well. No chest pain or SOB. She is cutting back on cigarette use by using an electronic cigarette.   She has no primary care doctor. She does not wish to have any routine screening and does not want a primary care doctor.    Past Medical History  Diagnosis Date  . Atrial septal defect     hx of it  . Personal history of other lymphatic and hematopoietic neoplasm     hodgkins lymphoma  . HTN (hypertension)   . Personal history of malignant neoplasm of cervix uteri   . Other and unspecified hyperlipidemia   . CAD (coronary artery disease)     s/p prior stenting of the right coronary artery in '01 and non-ST-elevation myocardial infarction in jan '09, with a new bare-metal stent placed in the ostium of the left anterior descending coronary artert and the coronary anatomy as described above with total occlusion of the  previously placed right coronary artery stent. EF 35-40%  . HTN (hypertension)   . History of non-Hodgkin's lymphoma   . Hyperlipidemia   . History of cervical cancer     Past Surgical History  Procedure Date  . Asd and vsd repair     Current Outpatient Prescriptions  Medication Sig Dispense Refill  . amLODipine (NORVASC) 10 MG tablet Take 1 tablet (10 mg total) by mouth daily.  90 tablet  3  . aspirin 325 MG tablet Take 325 mg by mouth daily.        . carvedilol (COREG) 12.5 MG tablet Take 1 tablet (12.5 mg total) by mouth 2 (two) times daily.  180 tablet  3  . lisinopril (PRINIVIL,ZESTRIL) 5 MG tablet Take 1 tablet (5 mg total) by mouth daily.  90 tablet  3  . nitroGLYCERIN (NITROSTAT) 0.4 MG SL tablet Place 1 tablet (0.4 mg total) under the tongue every 5 (five) minutes as needed.  25 tablet  6  . simvastatin (ZOCOR) 40 MG tablet Take 0.5 tablets (20 mg total) by mouth daily.  90 tablet  3    No Known Allergies  History   Social History  . Marital Status: Divorced    Spouse Name: N/A    Number of Children: N/A  . Years of Education: N/A   Occupational History  . Not on file.   Social History Main Topics  . Smoking status: Current Everyday Smoker -- 1.0 packs/day for 50 years    Types: Cigarettes  .  Smokeless tobacco: Current User    Types: Chew   Comment: 1 ppd  . Alcohol Use: No  . Drug Use: No  . Sexually Active: Not on file   Other Topics Concern  . Not on file   Social History Narrative   Lives in [place], [place] alone.Tracer study.     No family history on file.  Review of Systems:  As stated in the HPI and otherwise negative.   BP 138/65  Pulse 50  Resp 18  Ht 5' (1.524 m)  Wt 171 lb 6.4 oz (77.747 kg)  BMI 33.47 kg/m2  Physical Examination: General: Well developed, well nourished, NAD HEENT: OP clear, mucus membranes moist SKIN: warm, dry. No rashes. Neuro: No focal deficits Musculoskeletal: Muscle strength 5/5 all ext Psychiatric: Mood and  affect normal Neck: No JVD, no carotid bruits, no thyromegaly, no lymphadenopathy. Lungs:Clear bilaterally, no wheezes, rhonci, crackles Cardiovascular: Regular rate and rhythm. No murmurs, gallops or rubs. Abdomen:Soft. Bowel sounds present. Non-tender.  Extremities: No lower extremity edema. Pulses are 1 + in the bilateral DP/PT.  07/14/07: Cardiac cath:  RESULTS: The left main coronary artery: The main coronary artery was  free of disease.  The left anterior descending artery: The left descending artery gave  rise to an early optional diagonal branch, two septal perforators and a  second diagonal branch, and then two more septal perforators. There was  moderate calcification in the proximal to mid-LAD. There was a 90%  stenosis in the proximal LAD, just after the first optional diagonal  branch with disease extending to the ostium. There was a 40% narrowing  in the mid-LAD and 30% in the proximal mid-LAD, and 30% narrowing in the  mid-LAD, and there was 40% narrowing in the second diagonal branch.  The circumflex artery: The circumflex artery gave rise to a marginal  branch and two posterolateral branches. There was 80% stenosis in one  of the posterolateral branches.  The right coronary artery: The right coronary artery was completely  occluded at its midportion, after right ventricular branch and after the  previously placed stent. The distal right coronary was well  collateralized from left coronary artery and consisted of posterior  descending and two posterolateral branches.  Lexiscan myoview 02/08/12:  Stress Procedure: The patient received IV Lexiscan 0.4 mg over 15-seconds with concurrent low level exercise and then Technetium 52m Tetrofosmin was injected at 30-seconds while the patient continued walking one more minute. There were no significant changes, sob, occ pvcs/rare pac and abdominal pain with Lexiscan. Quantitative spect images were obtained after a 45-minute delay.    Stress ECG: No significant change from baseline ECG  QPS  Raw Data Images: Normal; no motion artifact; normal heart/lung ratio.  Stress Images: Medium, moderate basal to mid inferior and inferolateral perfusiond defect.  Rest Images: Small, mild-moderate basal inferior and inferolateral perfusion defect.  Subtraction (SDS): Partially reversible basal to mid inferior and inferolateral perfusion defect.  Transient Ischemic Dilatation (Normal <1.22): 1.12  Lung/Heart Ratio (Normal <0.45): 0.43  Quantitative Gated Spect Images  QGS EDV: 125 ml  QGS ESV: 75 ml  Impression  Exercise Capacity: Lexiscan with no exercise.  BP Response: Hypotensive blood pressure response.  Clinical Symptoms: Short of breath  ECG Impression: No significant ST segment change suggestive of ischemia.  Comparison with Prior Nuclear Study: No images to compare  Overall Impression: Moderate risk study. There is a medium, moderate partially reversible basal to mid inferior and inferolateral perfusion defect suggesting infarct with peri-infarct  ischemia. Corresponding inferior and inferolateral wall motion abnormality.  LV Ejection Fraction: 40%. LV Wall Motion: Inferior and inferolateral hypokinesis.    Assessment and Plan:   1. CAD:  She is known to have a chronically occluded RCA with good collaterals. Her stress test shows scar with possible ischemia in this distribution. No ischemia in anterior wall. Will continue medical therapy for now. Smoking cessation encouraged. Continue ASA 81 mg po Qdaily. Continue beta blocker, statin. BP well controlled on amlodipine.   2. CARDIOMYOPATHY, ISCHEMIC: Last LVEF 40% by [facility] August 2013.  Continue beta blocker and Ace inhibitor.   3. Tobacco abuse: Cessation encouraged.

## 2012-02-15 NOTE — Patient Instructions (Addendum)
Your physician wants you to follow-up in:  6 months. You will receive a reminder letter in the mail two months in advance. If you don't receive a letter, please call our office to schedule the follow-up appointment.   

## 2012-09-20 ENCOUNTER — Encounter: Payer: Self-pay | Admitting: Cardiovascular Disease

## 2012-09-20 ENCOUNTER — Ambulatory Visit (INDEPENDENT_AMBULATORY_CARE_PROVIDER_SITE_OTHER): Payer: Medicare Other | Admitting: Cardiovascular Disease

## 2012-09-20 VITALS — BP 150/76 | HR 46 | Ht 60.0 in | Wt 171.0 lb

## 2012-09-20 DIAGNOSIS — I10 Essential (primary) hypertension: Secondary | ICD-10-CM

## 2012-09-20 DIAGNOSIS — I2510 Atherosclerotic heart disease of native coronary artery without angina pectoris: Secondary | ICD-10-CM

## 2012-09-20 NOTE — Patient Instructions (Addendum)
Your physician wants you to follow-up in:   12 months. You will receive a reminder letter in the mail two months in advance. If you don't receive a letter, please call our office to schedule the follow-up appointment.   Your physician recommends that you return for fasting lab work in:  August.  The lab opens at 7:30 AM every week day.  Lipid and liver profiles will be checked.

## 2012-09-20 NOTE — Progress Notes (Signed)
History of Present Illness: 67 yo WF with history of CAD, HTN, hyperlipidemia, and Non-Hodgkins lymphoma here today for cardiac follow up. She has been followed in the past by Dr. Juanda Chance. She drives all the way from the [place] for her visits. She had bare-metal stent placement right coronary in 2001. She was hospitalized in January of 2009 with a non-ST elevation MI and underwent stenting of the ostium of the LAD with a bare-metal stent. Her ejection fraction is 35-40%. RCA is chronically occluded with good collaterals. I saw her 01/28/12 and she told me that she continued to smoke daily. A Lexiscan myoview was arranged given her history of ostial LAD bare metal stenting and no recent ischemic workup. Her myoview on 02/08/12 showed a medium, moderate partially reversible basal to mid inferior and inferolateral perfusion defect suggesting infarct with peri-infarct ischemia. Corresponding inferior and inferolateral wall motion abnormality. LV Ejection Fraction: 40%. LV Wall Motion: Inferior and inferolateral hypokinesis.   She is here today for follow up. She is feeling well. No chest pain or SOB. She is cutting back on cigarette use by using an electronic cigarette.   Primary Care Physician: Ernst Breach (she has not seen him yet)  Last Lipid Profile:Lipid Panel     Component Value Date/Time   CHOL 132 01/28/2012 1603   TRIG 166.0* 01/28/2012 1603   HDL 44.60 01/28/2012 1603   CHOLHDL 3 01/28/2012 1603   VLDL 33.2 01/28/2012 1603   LDLCALC 54 01/28/2012 1603     Past Medical History  Diagnosis Date  . Atrial septal defect     hx of it  . Personal history of other lymphatic and hematopoietic neoplasm     hodgkins lymphoma  . HTN (hypertension)   . Personal history of malignant neoplasm of cervix uteri   . Other and unspecified hyperlipidemia   . CAD (coronary artery disease)     s/p prior stenting of the right coronary artery in '01 and non-ST-elevation myocardial infarction in jan '09, with a  new bare-metal stent placed in the ostium of the left anterior descending coronary artert and the coronary anatomy as described above with total occlusion of the previously placed right coronary artery stent. EF 35-40%  . HTN (hypertension)   . History of non-Hodgkin's lymphoma   . Hyperlipidemia   . History of cervical cancer     Past Surgical History  Procedure Laterality Date  . Asd and vsd repair      Current Outpatient Prescriptions  Medication Sig Dispense Refill  . amLODipine (NORVASC) 10 MG tablet Take 1 tablet (10 mg total) by mouth daily.  90 tablet  3  . aspirin 325 MG tablet TAKE A HALF TO WHOLE TAB  DAILY      . carvedilol (COREG) 12.5 MG tablet Take 1 tablet (12.5 mg total) by mouth 2 (two) times daily.  180 tablet  3  . lisinopril (PRINIVIL,ZESTRIL) 5 MG tablet Take 1 tablet (5 mg total) by mouth daily.  90 tablet  3  . nitroGLYCERIN (NITROSTAT) 0.4 MG SL tablet Place 1 tablet (0.4 mg total) under the tongue every 5 (five) minutes as needed.  25 tablet  6  . simvastatin (ZOCOR) 40 MG tablet Take 0.5 tablets (20 mg total) by mouth daily.  90 tablet  3   No current facility-administered medications for this visit.    No Known Allergies  History   Social History  . Marital Status: Divorced    Spouse Name: N/A  Number of Children: N/A  . Years of Education: N/A   Occupational History  . Not on file.   Social History Main Topics  . Smoking status: Current Every Day Smoker -- 1.00 packs/day for 50 years    Types: Cigarettes  . Smokeless tobacco: Current User    Types: Chew     Comment: 1 ppd  . Alcohol Use: No  . Drug Use: No  . Sexually Active: Not on file   Other Topics Concern  . Not on file   Social History Narrative   Lives in [place], [place] alone.   Tracer study.     No family history on file.  Review of Systems:  As stated in the HPI and otherwise negative.   BP 150/76  Pulse 46  Ht 5' (1.524 m)  Wt 171 lb (77.565 kg)  BMI 33.4  kg/m2  Physical Examination: General: Well developed, well nourished, NAD HEENT: OP clear, mucus membranes moist SKIN: warm, dry. No rashes. Neuro: No focal deficits Musculoskeletal: Muscle strength 5/5 all ext Psychiatric: Mood and affect normal Neck: No JVD, no carotid bruits, no thyromegaly, no lymphadenopathy. Lungs:Clear bilaterally, no wheezes, rhonci, crackles Cardiovascular: Huston Foley, regular rhythm. No murmurs, gallops or rubs. Abdomen:Soft. Bowel sounds present. Non-tender.  Extremities: No lower extremity edema. Pulses are 2 + in the bilateral DP/PT.  EKG: Sinus brady, rate 50 bpm.   Assessment and Plan:   1. CAD: She is known to have a chronically occluded RCA with good collaterals. Her stress test 2013 showed scar with possible ischemia in this distribution. No ischemia in anterior wall. Will continue medical therapy for now. Smoking cessation encouraged. Continue ASA 81 mg po Qdaily. Continue beta blocker, statin. BP has been well controlled on amlodipine but up some today. She will check BP at home over next two weeks and let us know if it is elevated. Would increase Lisinopril if needed for better BP control.   2. CARDIOMYOPATHY, ISCHEMIC: Last LVEF 40% by [facility] August 2013. Continue beta blocker and Ace inhibitor.   3. Tobacco abuse: Cessation encouraged.   4. She is asked to see primary care to establish for routine health and cancer screening.

## 2013-01-16 ENCOUNTER — Other Ambulatory Visit (INDEPENDENT_AMBULATORY_CARE_PROVIDER_SITE_OTHER): Payer: Medicare Other

## 2013-01-16 ENCOUNTER — Other Ambulatory Visit: Payer: Self-pay | Admitting: *Deleted

## 2013-01-16 DIAGNOSIS — I2510 Atherosclerotic heart disease of native coronary artery without angina pectoris: Secondary | ICD-10-CM

## 2013-01-16 LAB — LIPID PANEL
Cholesterol: 127 mg/dL (ref 0–200)
LDL Cholesterol: 54 mg/dL (ref 0–99)
Triglycerides: 181 mg/dL — ABNORMAL HIGH (ref 0.0–149.0)

## 2013-01-16 LAB — HEPATIC FUNCTION PANEL
ALT: 19 U/L (ref 0–35)
AST: 23 U/L (ref 0–37)
Albumin: 3.7 g/dL (ref 3.5–5.2)
Alkaline Phosphatase: 68 U/L (ref 39–117)
Total Protein: 6.8 g/dL (ref 6.0–8.3)

## 2013-01-16 MED ORDER — LISINOPRIL 5 MG PO TABS: 5.0000 mg | ORAL_TABLET | Freq: Every day | ORAL | Status: DC

## 2013-01-16 MED ORDER — CARVEDILOL 12.5 MG PO TABS: 12.5000 mg | ORAL_TABLET | Freq: Two times a day (BID) | ORAL | Status: DC

## 2013-01-16 MED ORDER — SIMVASTATIN 40 MG PO TABS: 20.0000 mg | ORAL_TABLET | Freq: Every day | ORAL | Status: DC

## 2013-01-16 MED ORDER — AMLODIPINE BESYLATE 10 MG PO TABS: 10.0000 mg | ORAL_TABLET | Freq: Every day | ORAL | Status: DC

## 2013-01-17 ENCOUNTER — Telehealth: Payer: Self-pay | Admitting: Cardiovascular Disease

## 2013-01-17 NOTE — Telephone Encounter (Signed)
Follow Up ° ° °Pt is returning call from yesterday. Please call back. °

## 2013-01-17 NOTE — Telephone Encounter (Signed)
Spoke with pt and reviewed lipid and liver profile results with her.  

## 2013-04-12 ENCOUNTER — Encounter: Payer: Self-pay | Admitting: Cardiovascular Disease

## 2013-09-28 ENCOUNTER — Encounter: Payer: Self-pay | Admitting: Cardiovascular Disease

## 2013-09-28 ENCOUNTER — Ambulatory Visit (INDEPENDENT_AMBULATORY_CARE_PROVIDER_SITE_OTHER): Payer: Medicare Other | Admitting: Cardiovascular Disease

## 2013-09-28 VITALS — BP 150/70 | HR 75 | Ht 60.0 in | Wt 167.0 lb

## 2013-09-28 DIAGNOSIS — F17200 Nicotine dependence, unspecified, uncomplicated: Secondary | ICD-10-CM

## 2013-09-28 DIAGNOSIS — E785 Hyperlipidemia, unspecified: Secondary | ICD-10-CM

## 2013-09-28 DIAGNOSIS — I10 Essential (primary) hypertension: Secondary | ICD-10-CM

## 2013-09-28 DIAGNOSIS — I2510 Atherosclerotic heart disease of native coronary artery without angina pectoris: Secondary | ICD-10-CM

## 2013-09-28 DIAGNOSIS — I255 Ischemic cardiomyopathy: Secondary | ICD-10-CM

## 2013-09-28 DIAGNOSIS — Z720 Tobacco use: Secondary | ICD-10-CM

## 2013-09-28 DIAGNOSIS — I2589 Other forms of chronic ischemic heart disease: Secondary | ICD-10-CM

## 2013-09-28 MED ORDER — LISINOPRIL 10 MG PO TABS: 10.0000 mg | ORAL_TABLET | Freq: Every day | ORAL | Status: DC

## 2013-09-28 NOTE — Patient Instructions (Signed)
Your physician wants you to follow-up in:  12 months. You will receive a reminder letter in the mail two months in advance. If you don't receive a letter, please call our office to schedule the follow-up appointment.  Your physician recommends that you return for fasting lab work in:  August 2015.  The lab opens at 7:30 AM every week day.  Your physician has recommended you make the following change in your medication:  Increase lisinopril to 10 mg by mouth daily

## 2013-09-28 NOTE — Progress Notes (Signed)
History of Present Illness: 68 yo WF with history of CAD, HTN, hyperlipidemia, and Non-Hodgkins lymphoma here today for cardiac follow up. She has been followed in the past by Dr. Juanda ChanceBrodie. She drives all the way from the [place] for her visits. She had bare-metal stent placement right coronary in 2001. She was hospitalized in January of 2009 with a non-ST elevation MI and underwent stenting of the ostium of the LAD with a bare-metal stent. Her ejection fraction is 35-40%. RCA is chronically occluded with good collaterals. I saw her 01/28/12 and she told me that she continued to smoke daily. A Lexiscan myoview was arranged given her history of ostial LAD bare metal stenting and no recent ischemic workup. Her myoview on 02/08/12 showed a medium, moderate partially reversible basal to mid inferior and inferolateral perfusion defect suggesting infarct with peri-infarct ischemia. Corresponding inferior and inferolateral wall motion abnormality. LV Ejection Fraction: 40%. LV Wall Motion: Inferior and inferolateral hypokinesis.   She is here today for follow up. She is feeling well. No chest pain or SOB. She is trying to stop smoking.   Primary Care Physician: Ernst BreachJohn Favero (she has not seen him yet)  Last Lipid Profile:Lipid Panel     Component Value Date/Time   CHOL 127 01/16/2013 1119   TRIG 181.0* 01/16/2013 1119   HDL 36.90* 01/16/2013 1119   CHOLHDL 3 01/16/2013 1119   VLDL 36.2 01/16/2013 1119   LDLCALC 54 01/16/2013 1119   Past Medical History  Diagnosis Date  . Atrial septal defect     hx of it  . Personal history of other lymphatic and hematopoietic neoplasm     hodgkins lymphoma  . HTN (hypertension)   . Personal history of malignant neoplasm of cervix uteri   . Other and unspecified hyperlipidemia   . CAD (coronary artery disease)     s/p prior stenting of the right coronary artery in '01 and non-ST-elevation myocardial infarction in jan '09, with a new bare-metal stent placed in the ostium of  the left anterior descending coronary artert and the coronary anatomy as described above with total occlusion of the previously placed right coronary artery stent. EF 35-40%  . HTN (hypertension)   . History of non-Hodgkin's lymphoma   . Hyperlipidemia   . History of cervical cancer     Past Surgical History  Procedure Laterality Date  . Asd and vsd repair      Current Outpatient Prescriptions  Medication Sig Dispense Refill  . amLODipine (NORVASC) 10 MG tablet Take 1 tablet (10 mg total) by mouth daily.  90 tablet  3  . aspirin 81 MG tablet Take 81 mg by mouth 2 (two) times daily.      . carvedilol (COREG) 12.5 MG tablet Take 1 tablet (12.5 mg total) by mouth 2 (two) times daily.  180 tablet  3  . lisinopril (PRINIVIL,ZESTRIL) 5 MG tablet Take 1 tablet (5 mg total) by mouth daily.  90 tablet  3  . nitroGLYCERIN (NITROSTAT) 0.4 MG SL tablet Place 1 tablet (0.4 mg total) under the tongue every 5 (five) minutes as needed.  25 tablet  6  . simvastatin (ZOCOR) 40 MG tablet Take 0.5 tablets (20 mg total) by mouth daily.  90 tablet  3   No current facility-administered medications for this visit.    No Known Allergies  History   Social History  . Marital Status: Divorced    Spouse Name: N/A    Number of Children: N/A  .  Years of Education: N/A   Occupational History  . Not on file.   Social History Main Topics  . Smoking status: Current Every Day Smoker -- 1.00 packs/day for 50 years    Types: Cigarettes  . Smokeless tobacco: Current User    Types: Chew     Comment: 1 ppd  . Alcohol Use: No  . Drug Use: No  . Sexual Activity: Not on file   Other Topics Concern  . Not on file   Social History Narrative   Lives in [place], [place] alone.   Tracer study.     No family history on file.  Review of Systems:  As stated in the HPI and otherwise negative.   BP 150/70  Pulse 75  Ht 5' (1.524 m)  Wt 167 lb (75.751 kg)  BMI 32.62 kg/m2  Physical Examination: General: Well  developed, well nourished, NAD HEENT: OP clear, mucus membranes moist SKIN: warm, dry. No rashes. Neuro: No focal deficits Musculoskeletal: Muscle strength 5/5 all ext Psychiatric: Mood and affect normal Neck: No JVD, no carotid bruits, no thyromegaly, no lymphadenopathy. Lungs:Clear bilaterally, no wheezes, rhonci, crackles Cardiovascular: Huston FoleyBrady, regular rhythm. No murmurs, gallops or rubs. Abdomen:Soft. Bowel sounds present. Non-tender.  Extremities: No lower extremity edema. Pulses are 2 + in the bilateral DP/PT.  EKG: Sinus, rate 75 bpm. Occasional PVCs. Old inferior infarct  Assessment and Plan:   1. CAD: She is known to have a chronically occluded RCA with good collaterals. Her stress test 2013 showed scar with possible ischemia in this distribution. No ischemia in anterior wall. Will continue medical therapy for now. Continue ASA 81 mg po Qdaily. Based on recent finding of DAPT trial, we discussed adding Plavix 75 mg po Qdaily but she does not want to start this. Continue beta blocker, statin.   2. CARDIOMYOPATHY, ISCHEMIC: [facility]. Continue beta blocker and Ace inhibitor.   3. Tobacco abuse: Cessation encouraged. She is trying to stop.   4. HTN: BP is elevated today. Will increase Lisinopri to 10 mg po Qdaily.   5. Hyperlipidemia: Continue statin. Check lipids and LFTs in August 2015.

## 2014-01-16 ENCOUNTER — Other Ambulatory Visit (INDEPENDENT_AMBULATORY_CARE_PROVIDER_SITE_OTHER): Payer: Medicare Other

## 2014-01-16 DIAGNOSIS — E785 Hyperlipidemia, unspecified: Secondary | ICD-10-CM

## 2014-01-16 LAB — LIPID PANEL
CHOLESTEROL: 115 mg/dL (ref 0–200)
HDL: 35.9 mg/dL — ABNORMAL LOW (ref >39.00)
LDL Cholesterol: 44 mg/dL (ref 0–99)
NonHDL: 79.1
TRIGLYCERIDES: 175 mg/dL — AB (ref 0.0–149.0)
Total CHOL/HDL Ratio: 3
VLDL: 35 mg/dL (ref 0.0–40.0)

## 2014-01-16 LAB — HEPATIC FUNCTION PANEL
ALT: 19 U/L (ref 0–35)
AST: 23 U/L (ref 0–37)
Albumin: 3.7 g/dL (ref 3.5–5.2)
Alkaline Phosphatase: 75 U/L (ref 39–117)
Bilirubin, Direct: 0 mg/dL (ref 0.0–0.3)
Total Bilirubin: 0.6 mg/dL (ref 0.2–1.2)
Total Protein: 6.7 g/dL (ref 6.0–8.3)

## 2014-04-01 ENCOUNTER — Other Ambulatory Visit: Payer: Self-pay | Admitting: Cardiovascular Disease

## 2014-09-30 ENCOUNTER — Other Ambulatory Visit: Payer: Self-pay | Admitting: Cardiovascular Disease

## 2014-10-02 ENCOUNTER — Ambulatory Visit (INDEPENDENT_AMBULATORY_CARE_PROVIDER_SITE_OTHER): Payer: Medicare Other | Admitting: Cardiovascular Disease

## 2014-10-02 ENCOUNTER — Encounter: Payer: Self-pay | Admitting: Cardiovascular Disease

## 2014-10-02 VITALS — BP 154/78 | HR 69 | Ht 60.0 in | Wt 165.0 lb

## 2014-10-02 DIAGNOSIS — Z720 Tobacco use: Secondary | ICD-10-CM

## 2014-10-02 DIAGNOSIS — I2510 Atherosclerotic heart disease of native coronary artery without angina pectoris: Secondary | ICD-10-CM | POA: Diagnosis not present

## 2014-10-02 DIAGNOSIS — I255 Ischemic cardiomyopathy: Secondary | ICD-10-CM | POA: Diagnosis not present

## 2014-10-02 DIAGNOSIS — R0989 Other specified symptoms and signs involving the circulatory and respiratory systems: Secondary | ICD-10-CM

## 2014-10-02 DIAGNOSIS — I10 Essential (primary) hypertension: Secondary | ICD-10-CM

## 2014-10-02 DIAGNOSIS — E785 Hyperlipidemia, unspecified: Secondary | ICD-10-CM

## 2014-10-02 MED ORDER — LISINOPRIL 10 MG PO TABS: 10.0000 mg | ORAL_TABLET | Freq: Every day | ORAL | Status: DC

## 2014-10-02 NOTE — Progress Notes (Signed)
Chief Complaint  Patient presents with  . Coronary Artery Disease     History of Present Illness: 69 yo WF with history of CAD, HTN, hyperlipidemia, and Non-Hodgkins lymphoma here today for cardiac follow up. She has been followed in the past by Dr. Juanda Chance. She drives all the way from the [place] for her visits. She had bare-metal stent placement right coronary in 2001. She was hospitalized in January of 2009 with a non-ST elevation MI and underwent stenting of the ostium of the LAD with a bare-metal stent. Her ejection fraction is 35-40%. RCA is chronically occluded with good collaterals. I saw her 01/28/12 and she told me that she continued to smoke daily. A Lexiscan myoview was arranged given her history of ostial LAD bare metal stenting and no recent ischemic workup. Her myoview on 02/08/12 showed a medium, moderate partially reversible basal to mid inferior and inferolateral perfusion defect suggesting infarct with peri-infarct ischemia. Corresponding inferior and inferolateral wall motion abnormality. LV Ejection Fraction: 40%. LV Wall Motion: Inferior and inferolateral hypokinesis.   She is here today for follow up. She is feeling well. No chest pain or SOB. She is still smoking 1ppd. She is trying to stop smoking.   Primary Care Physician: Ernst Breach (she has not seen him yet)  Last Lipid Profile:Lipid Panel     Component Value Date/Time   CHOL 115 01/16/2014 0913   TRIG 175.0* 01/16/2014 0913   HDL 35.90* 01/16/2014 0913   CHOLHDL 3 01/16/2014 0913   VLDL 35.0 01/16/2014 0913   LDLCALC 44 01/16/2014 0913   Past Medical History  Diagnosis Date  . Atrial septal defect     hx of it  . Personal history of other lymphatic and hematopoietic neoplasm     hodgkins lymphoma  . HTN (hypertension)   . Personal history of malignant neoplasm of cervix uteri   . Other and unspecified hyperlipidemia   . CAD (coronary artery disease)     s/p prior stenting of the right coronary artery in  '01 and non-ST-elevation myocardial infarction in jan '09, with a new bare-metal stent placed in the ostium of the left anterior descending coronary artert and the coronary anatomy as described above with total occlusion of the previously placed right coronary artery stent. EF 35-40%  . HTN (hypertension)   . History of non-Hodgkin's lymphoma   . Hyperlipidemia   . History of cervical cancer     Past Surgical History  Procedure Laterality Date  . Asd and vsd repair      Current Outpatient Prescriptions  Medication Sig Dispense Refill  . amLODipine (NORVASC) 10 MG tablet TAKE 1 TABLET (10 MG TOTAL) BY MOUTH DAILY. 90 tablet 3  . aspirin 81 MG tablet Take 81 mg by mouth 2 (two) times daily.    . carvedilol (COREG) 12.5 MG tablet TAKE 1 TABLET (12.5 MG TOTAL) BY MOUTH 2 (TWO) TIMES DAILY. 180 tablet 3  . lisinopril (PRINIVIL,ZESTRIL) 10 MG tablet Take 1 tablet (10 mg total) by mouth daily. 90 tablet 3  . nitroGLYCERIN (NITROSTAT) 0.4 MG SL tablet Place 0.4 mg under the tongue every 5 (five) minutes as needed for chest pain (for chest pain).    . simvastatin (ZOCOR) 40 MG tablet TAKE 1/2 TABLET (20 MG TOTAL) BY MOUTH DAILY. 45 tablet 3   No current facility-administered medications for this visit.    No Known Allergies  History   Social History  . Marital Status: Divorced    Spouse Name: N/A  .  Number of Children: N/A  . Years of Education: N/A   Occupational History  . Not on file.   Social History Main Topics  . Smoking status: Current Every Day Smoker -- 1.00 packs/day for 50 years    Types: Cigarettes  . Smokeless tobacco: Current User    Types: Chew     Comment: 1 ppd  . Alcohol Use: No  . Drug Use: No  . Sexual Activity: Not on file   Other Topics Concern  . Not on file   Social History Narrative   Lives in [place], [place] alone.   Tracer study.     History reviewed. No pertinent family history.  Review of Systems:  As stated in the HPI and otherwise negative.     BP 154/78 mmHg  Pulse 69  Ht 5' (1.524 m)  Wt 165 lb (74.844 kg)  BMI 32.22 kg/m2  Physical Examination: General: Well developed, well nourished, NAD HEENT: OP clear, mucus membranes moist SKIN: warm, dry. No rashes. Neuro: No focal deficits Musculoskeletal: Muscle strength 5/5 all ext Psychiatric: Mood and affect normal Neck: No JVD, no carotid bruits, no thyromegaly, no lymphadenopathy. Lungs:Clear bilaterally, no wheezes, rhonci, crackles Cardiovascular: Huston FoleyBrady, regular rhythm. No murmurs, gallops or rubs. Abdomen:Soft. Bowel sounds present. Non-tender.  Extremities: No lower extremity edema. Pulses are 2 + in the bilateral DP/PT.  EKG:  EKG is ordered today. The ekg ordered today demonstrates NSR, rate 69 bpm.   Recent Labs: 01/16/2014: ALT 19   Lipid Panel    Component Value Date/Time   CHOL 115 01/16/2014 0913   TRIG 175.0* 01/16/2014 0913   HDL 35.90* 01/16/2014 0913   CHOLHDL 3 01/16/2014 0913   VLDL 35.0 01/16/2014 0913   LDLCALC 44 01/16/2014 0913   LDLDIRECT 61.4 05/11/2010 1116     Wt Readings from Last 3 Encounters:  10/02/14 165 lb (74.844 kg)  09/28/13 167 lb (75.751 kg)  09/20/12 171 lb (77.565 kg)     Other studies Reviewed: Additional studies/ records that were reviewed today include: . Review of the above records demonstrates:   Assessment and Plan:   1. CAD: She is known to have a chronically occluded RCA with good collaterals. Her stress test 2013 showed scar with possible ischemia in this distribution. No ischemia in anterior wall. Will continue medical therapy for now. Continue ASA 81 mg po Qdaily. She does not wish to start Plavix. Continue beta blocker, statin.   2. CARDIOMYOPATHY, ISCHEMIC: [facility]. Continue beta blocker and Ace inhibitor.   3. Tobacco abuse: Cessation encouraged. She is trying to stop.   4. HTN: BP is elevated today but she does not wish to change her meds. Will continue current meds.     5. Hyperlipidemia: Continue statin. Repeat lipids in August 2016.   6. Carotid artery bruit: Will check carotid artery dopplers  Current medicines are reviewed at length with the patient today.  The patient does not have concerns regarding medicines.  The following changes have been made:  no change  Labs/ tests ordered today include: carotid dopplers, lipids, LFTS   Orders Placed This Encounter  Procedures  . Lipid Profile  . Hepatic function panel  . EKG 12-Lead  . Carotid duplex    Disposition:   FU with me  in 12  months  Signed, Verne Carrowhristopher Jayan Raymundo, MD 10/02/2014 4:41 PM    [facility] [address], [place], [place]  [address] Phone: [phone]; Fax: [phone]        

## 2014-10-02 NOTE — Patient Instructions (Signed)
Medication Instructions:  Your physician recommends that you continue on your current medications as directed. Please refer to the Current Medication list given to you today.   Labwork: Your physician recommends that you return for fasting lab work in: August on day of carotid doppler (lipid and liver profiles)   Testing/Procedures: Your physician has requested that you have a carotid duplex. This test is an ultrasound of the carotid arteries in your neck. It looks at blood flow through these arteries that supply the brain with blood. Allow one hour for this exam. There are no restrictions or special instructions. To be done in August    Follow-Up: Your physician wants you to follow-up in: 12 months.  You will receive a reminder letter in the mail two months in advance. If you don't receive a letter, please call our office to schedule the follow-up appointment.   Any Other Special Instructions Will Be Listed Below (If Applicable).

## 2014-10-17 ENCOUNTER — Other Ambulatory Visit: Payer: Self-pay | Admitting: Cardiovascular Disease

## 2014-10-17 DIAGNOSIS — R0989 Other specified symptoms and signs involving the circulatory and respiratory systems: Secondary | ICD-10-CM

## 2015-01-20 ENCOUNTER — Encounter (HOSPITAL_COMMUNITY): Payer: Medicare Other

## 2015-01-20 ENCOUNTER — Other Ambulatory Visit (INDEPENDENT_AMBULATORY_CARE_PROVIDER_SITE_OTHER): Payer: Medicare Other | Admitting: *Deleted

## 2015-01-20 ENCOUNTER — Ambulatory Visit (HOSPITAL_COMMUNITY)
Admission: RE | Admit: 2015-01-20 | Discharge: 2015-01-20 | Disposition: A | Discharge status: Home / Self Care | Payer: Medicare Other | Source: Ambulatory Visit | Attending: Cardiovascular Disease | Admitting: Cardiovascular Disease

## 2015-01-20 DIAGNOSIS — E785 Hyperlipidemia, unspecified: Secondary | ICD-10-CM | POA: Diagnosis not present

## 2015-01-20 DIAGNOSIS — I6523 Occlusion and stenosis of bilateral carotid arteries: Secondary | ICD-10-CM | POA: Diagnosis not present

## 2015-01-20 DIAGNOSIS — R0989 Other specified symptoms and signs involving the circulatory and respiratory systems: Secondary | ICD-10-CM

## 2015-01-20 LAB — HEPATIC FUNCTION PANEL
ALK PHOS: 79 U/L (ref 39–117)
ALT: 15 U/L (ref 0–35)
AST: 16 U/L (ref 0–37)
Albumin: 3.8 g/dL (ref 3.5–5.2)
Bilirubin, Direct: 0.1 mg/dL (ref 0.0–0.3)
Total Bilirubin: 0.4 mg/dL (ref 0.2–1.2)
Total Protein: 6.6 g/dL (ref 6.0–8.3)

## 2015-01-20 LAB — LIPID PANEL
CHOLESTEROL: 124 mg/dL (ref 0–200)
HDL: 37 mg/dL — ABNORMAL LOW (ref >39.00)
LDL Cholesterol: 56 mg/dL (ref 0–99)
NONHDL: 86.89
TRIGLYCERIDES: 156 mg/dL — AB (ref 0.0–149.0)
Total CHOL/HDL Ratio: 3
VLDL: 31.2 mg/dL (ref 0.0–40.0)

## 2015-01-21 ENCOUNTER — Other Ambulatory Visit: Payer: Self-pay

## 2015-01-21 DIAGNOSIS — R0989 Other specified symptoms and signs involving the circulatory and respiratory systems: Secondary | ICD-10-CM

## 2015-01-21 DIAGNOSIS — I2510 Atherosclerotic heart disease of native coronary artery without angina pectoris: Secondary | ICD-10-CM

## 2015-04-03 ENCOUNTER — Telehealth: Payer: Self-pay | Admitting: Cardiovascular Disease

## 2015-04-03 ENCOUNTER — Other Ambulatory Visit: Payer: Self-pay | Admitting: Cardiovascular Disease

## 2015-04-03 MED ORDER — SIMVASTATIN 40 MG PO TABS: ORAL_TABLET | ORAL | Status: DC

## 2015-04-03 MED ORDER — LISINOPRIL 10 MG PO TABS: 10.0000 mg | ORAL_TABLET | Freq: Every day | ORAL | Status: DC

## 2015-04-03 MED ORDER — AMLODIPINE BESYLATE 10 MG PO TABS: ORAL_TABLET | ORAL | Status: DC

## 2015-04-03 MED ORDER — CARVEDILOL 12.5 MG PO TABS: ORAL_TABLET | ORAL | Status: DC

## 2015-04-03 NOTE — Telephone Encounter (Signed)
New message      STAT if patient is at the pharmacy , call can be transferred to refill team.   1. Which medications need to be refilled? Lisinopril, carvedilol, simvastatin,amlodipine  2. Which pharmacy/location is medication to be sent to? [facility]  3. Do they need a 30 day or 90 day supply? 90 day supply

## 2015-09-15 ENCOUNTER — Other Ambulatory Visit: Payer: Self-pay | Admitting: Cardiovascular Disease

## 2015-09-15 DIAGNOSIS — I6523 Occlusion and stenosis of bilateral carotid arteries: Secondary | ICD-10-CM

## 2015-09-19 ENCOUNTER — Telehealth: Payer: Self-pay | Admitting: Cardiovascular Disease

## 2015-09-19 ENCOUNTER — Other Ambulatory Visit: Payer: Self-pay | Admitting: *Deleted

## 2015-09-19 MED ORDER — SIMVASTATIN 40 MG PO TABS: ORAL_TABLET | ORAL | Status: DC

## 2015-09-19 MED ORDER — AMLODIPINE BESYLATE 10 MG PO TABS: ORAL_TABLET | ORAL | Status: DC

## 2015-09-19 MED ORDER — CARVEDILOL 12.5 MG PO TABS: ORAL_TABLET | ORAL | Status: DC

## 2015-09-19 MED ORDER — LISINOPRIL 10 MG PO TABS: 10.0000 mg | ORAL_TABLET | Freq: Every day | ORAL | Status: DC

## 2015-09-19 NOTE — Telephone Encounter (Signed)
Pt needs refill of Amlodine, Simvastatin, Carvedilol and Lisinopril refills needed at [facility]

## 2015-09-30 ENCOUNTER — Inpatient Hospital Stay (HOSPITAL_COMMUNITY): Admission: RE | Admit: 2015-09-30 | Payer: Medicare Other | Source: Ambulatory Visit

## 2015-10-02 ENCOUNTER — Ambulatory Visit (HOSPITAL_COMMUNITY)
Admission: RE | Admit: 2015-10-02 | Discharge: 2015-10-02 | Disposition: A | Discharge status: Home / Self Care | Payer: Medicare Other | Source: Ambulatory Visit | Attending: Cardiovascular Disease | Admitting: Cardiovascular Disease

## 2015-10-02 DIAGNOSIS — I10 Essential (primary) hypertension: Secondary | ICD-10-CM | POA: Insufficient documentation

## 2015-10-02 DIAGNOSIS — I2510 Atherosclerotic heart disease of native coronary artery without angina pectoris: Secondary | ICD-10-CM | POA: Diagnosis not present

## 2015-10-02 DIAGNOSIS — I6523 Occlusion and stenosis of bilateral carotid arteries: Secondary | ICD-10-CM | POA: Insufficient documentation

## 2015-10-02 DIAGNOSIS — E785 Hyperlipidemia, unspecified: Secondary | ICD-10-CM | POA: Diagnosis not present

## 2015-10-03 ENCOUNTER — Telehealth: Payer: Self-pay | Admitting: Cardiovascular Disease

## 2015-10-06 NOTE — Telephone Encounter (Signed)
Closed encounter °

## 2015-10-14 ENCOUNTER — Encounter: Payer: Self-pay | Admitting: *Deleted

## 2015-10-17 ENCOUNTER — Ambulatory Visit (INDEPENDENT_AMBULATORY_CARE_PROVIDER_SITE_OTHER): Payer: Medicare Other | Admitting: Cardiovascular Disease

## 2015-10-17 ENCOUNTER — Encounter: Payer: Self-pay | Admitting: Cardiovascular Disease

## 2015-10-17 VITALS — BP 150/80 | HR 60 | Ht 60.0 in | Wt 170.0 lb

## 2015-10-17 DIAGNOSIS — I6523 Occlusion and stenosis of bilateral carotid arteries: Secondary | ICD-10-CM | POA: Diagnosis not present

## 2015-10-17 DIAGNOSIS — I2510 Atherosclerotic heart disease of native coronary artery without angina pectoris: Secondary | ICD-10-CM | POA: Diagnosis not present

## 2015-10-17 DIAGNOSIS — Z720 Tobacco use: Secondary | ICD-10-CM

## 2015-10-17 DIAGNOSIS — I10 Essential (primary) hypertension: Secondary | ICD-10-CM

## 2015-10-17 DIAGNOSIS — I255 Ischemic cardiomyopathy: Secondary | ICD-10-CM | POA: Diagnosis not present

## 2015-10-17 DIAGNOSIS — E785 Hyperlipidemia, unspecified: Secondary | ICD-10-CM

## 2015-10-17 MED ORDER — NITROGLYCERIN 0.4 MG SL SUBL: 0.4000 mg | SUBLINGUAL_TABLET | SUBLINGUAL | Status: AC | PRN

## 2015-10-17 MED ORDER — CARVEDILOL 12.5 MG PO TABS: ORAL_TABLET | ORAL | Status: DC

## 2015-10-17 MED ORDER — AMLODIPINE BESYLATE 10 MG PO TABS
ORAL_TABLET | ORAL | Status: DC
Start: 2015-10-17 — End: 2016-10-04

## 2015-10-17 MED ORDER — LISINOPRIL 10 MG PO TABS
10.0000 mg | ORAL_TABLET | Freq: Every day | ORAL | Status: DC
Start: 2015-10-17 — End: 2016-10-04

## 2015-10-17 MED ORDER — SIMVASTATIN 20 MG PO TABS: 20.0000 mg | ORAL_TABLET | Freq: Every day | ORAL | Status: DC

## 2015-10-17 NOTE — Patient Instructions (Signed)
Medication Instructions:  Your physician recommends that you continue on your current medications as directed. Please refer to the Current Medication list given to you today.   Labwork: Your physician recommends that you return for lab work in: August. On day of echo--Lipid and Liver profiles.  This will be fasting lab work   Testing/Procedures: Your physician has requested that you have an echocardiogram. Echocardiography is a painless test that uses sound waves to create images of your heart. It provides your doctor with information about the size and shape of your heart and how well your heart's chambers and valves are working. This procedure takes approximately one hour. There are no restrictions for this procedure. To be done in August.     Follow-Up: Your physician wants you to follow-up in: 12 months,.  You will receive a reminder letter in the mail two months in advance. If you don't receive a letter, please call our office to schedule the follow-up appointment.   Any Other Special Instructions Will Be Listed Below (If Applicable).     If you need a refill on your cardiac medications before your next appointment, please call your pharmacy.

## 2015-10-17 NOTE — Progress Notes (Signed)
Chief Complaint  Patient presents with  . Shortness of Breath     History of Present Illness: [name] with history of CAD, HTN, hyperlipidemia, s/p ASD and VSD repair and Non-Hodgkins lymphoma here today for cardiac follow up. She had a bare-metal stent placed in the RCA in 2001. She was hospitalized in January of 2009 with a non-ST elevation MI and underwent stenting of the ostium of the LAD with a bare-metal stent. At that time, her RCA was found to be chronically occluded with good collateral flow. Her ejection fraction is 40%. Nuclear stress test 02/08/12 showed a medium, moderate partially reversible basal to mid inferior and inferolateral perfusion defect suggesting infarct with peri-infarct ischemia with corresponding inferior and inferolateral wall motion abnormality, felt to be consistent with her known RCA occlusion.   She is here today for follow up. She is feeling well. No chest pain or SOB. She is still smoking 1ppd. She is trying to stop smoking. She is having some cramping in her legs with walking but refuses to have testing.   Primary Care Physician: Charlotte Salas  Past Medical History  Diagnosis Date  . Atrial septal defect     hx of it  . Personal history of other lymphatic and hematopoietic neoplasm     hodgkins lymphoma  . HTN (hypertension)   . Personal history of malignant neoplasm of cervix uteri   . Other and unspecified hyperlipidemia   . CAD (coronary artery disease)     s/p prior stenting of the right coronary artery in '01 and non-ST-elevation myocardial infarction in jan '09, with a new bare-metal stent placed in the ostium of the left anterior descending coronary artert and the coronary anatomy as described above with total occlusion of the previously placed right coronary artery stent. EF 35-40%  . HTN (hypertension)   . History of non-Hodgkin's lymphoma   . Hyperlipidemia   . History of cervical cancer     Past Surgical History  Procedure  Laterality Date  . Asd and vsd repair      Current Outpatient Prescriptions  Medication Sig Dispense Refill  . amLODipine (NORVASC) 10 MG tablet TAKE 1 TABLET (10 MG TOTAL) BY MOUTH DAILY. 90 tablet 3  . aspirin 325 MG tablet Take 325 mg by mouth daily.    . carvedilol (COREG) 12.5 MG tablet TAKE 1 TABLET (12.5 MG TOTAL) BY MOUTH 2 (TWO) TIMES DAILY. 180 tablet 3  . lisinopril (PRINIVIL,ZESTRIL) 10 MG tablet Take 1 tablet (10 mg total) by mouth daily. 90 tablet 3  . nitroGLYCERIN (NITROSTAT) 0.4 MG SL tablet Place 1 tablet (0.4 mg total) under the tongue every 5 (five) minutes as needed for chest pain (for chest pain). 25 tablet 6  . simvastatin (ZOCOR) 20 MG tablet Take 1 tablet (20 mg total) by mouth daily. 90 tablet 3   No current facility-administered medications for this visit.    No Known Allergies  Social History   Social History  . Marital Status: Divorced    Spouse Name: N/A  . Number of Children: N/A  . Years of Education: N/A   Occupational History  . Not on file.   Social History Main Topics  . Smoking status: Current Every Day Smoker -- 1.00 packs/day for 50 years    Types: Cigarettes  . Smokeless tobacco: Current User    Types: Chew     Comment: 1 ppd  . Alcohol Use: No  . Drug Use: No  . Sexual  Activity: Not on file   Other Topics Concern  . Not on file   Social History Narrative   Lives in [place], [place] alone.   Tracer study.     Family History  Problem Relation Age of Onset  . Leukemia Father   . Heart Problems Mother   . Heart attack Sister     Review of Systems:  As stated in the HPI and otherwise negative.   BP 150/80 mmHg  Pulse 60  Ht 5' (1.524 m)  Wt 170 lb (77.111 kg)  BMI 33.20 kg/m2  SpO2 94%  Physical Examination: General: Well developed, well nourished, NAD HEENT: OP clear, mucus membranes moist SKIN: warm, dry. No rashes. Neuro: No focal deficits Musculoskeletal: Muscle strength 5/5 all ext Psychiatric: Mood and affect  normal Neck: No JVD, no carotid bruits, no thyromegaly, no lymphadenopathy. Lungs:Clear bilaterally, no wheezes, rhonci, crackles Cardiovascular: Huston Foley, regular rhythm. No murmurs, gallops or rubs. Abdomen:Soft. Bowel sounds present. Non-tender.  Extremities: No lower extremity edema. Pulses are 2 + in the bilateral DP/PT.  EKG:  EKG is ordered today. The ekg ordered today demonstrates NSR, rate 69 bpm. T wave abn, unchanged.   Recent Labs: 01/20/2015: ALT 15   Lipid Panel    Component Value Date/Time   CHOL 124 01/20/2015 0931   TRIG 156.0* 01/20/2015 0931   HDL 37.00* 01/20/2015 0931   CHOLHDL 3 01/20/2015 0931   VLDL 31.2 01/20/2015 0931   LDLCALC 56 01/20/2015 0931   LDLDIRECT 61.4 05/11/2010 1116     Wt Readings from Last 3 Encounters:  10/17/15 170 lb (77.111 kg)  10/02/14 165 lb (74.844 kg)  09/28/13 167 lb (75.751 kg)     Other studies Reviewed: Additional studies/ records that were reviewed today include: . Review of the above records demonstrates:   Assessment and Plan:   1. CAD: She is known to have a chronically occluded RCA with good collaterals. Her stress test 2013 showed scar with possible ischemia in this distribution. No ischemia in anterior wall. Will continue ASA, beta blocker and statin. She does not wish to start Plavix.   2. CARDIOMYOPATHY, ISCHEMIC: Last LVEF 40% by [facility] August 2013. Continue beta blocker and Ace inhibitor. Will repeat echo this summer.   3. Tobacco abuse: Cessation encouraged. She is trying to stop.   4. HTN: BP is elevated today but she does not wish to change her meds. Will continue current meds.    5. Hyperlipidemia: Continue statin. LDL at goal August 2016.  Repeat LFTs and Lipids august.   6. Carotid artery disease: She has moderate carotid disease bilaterally, stable by dopplers April 2017.   Current medicines are reviewed at length with the patient today.  The patient does not have concerns regarding medicines.  The  following changes have been made:  no change  Labs/ tests ordered today include: carotid dopplers, lipids, LFTS   Orders Placed This Encounter  Procedures  . Lipid Profile  . Hepatic function panel  . EKG 12-Lead  . Echocardiogram    Disposition:   FU with me  in 12  months  Signed, Verne Carrow, MD 10/17/2015 1:56 PM    [facility] [address] [place], [place], [place]  [address] Phone: [phone]; Fax: [phone]

## 2015-11-19 ENCOUNTER — Ambulatory Visit: Payer: Medicare Other | Admitting: Cardiovascular Disease

## 2016-01-05 ENCOUNTER — Ambulatory Visit: Payer: Medicare Other | Admitting: Cardiovascular Disease

## 2016-01-19 ENCOUNTER — Other Ambulatory Visit: Payer: Self-pay

## 2016-01-19 ENCOUNTER — Ambulatory Visit (HOSPITAL_COMMUNITY): Payer: Medicare Other | Attending: Cardiovascular Disease

## 2016-01-19 ENCOUNTER — Other Ambulatory Visit (INDEPENDENT_AMBULATORY_CARE_PROVIDER_SITE_OTHER): Payer: Medicare Other

## 2016-01-19 DIAGNOSIS — I255 Ischemic cardiomyopathy: Secondary | ICD-10-CM | POA: Insufficient documentation

## 2016-01-19 DIAGNOSIS — I071 Rheumatic tricuspid insufficiency: Secondary | ICD-10-CM | POA: Insufficient documentation

## 2016-01-19 DIAGNOSIS — Z6833 Body mass index (BMI) 33.0-33.9, adult: Secondary | ICD-10-CM | POA: Diagnosis not present

## 2016-01-19 DIAGNOSIS — I119 Hypertensive heart disease without heart failure: Secondary | ICD-10-CM | POA: Diagnosis not present

## 2016-01-19 DIAGNOSIS — I340 Nonrheumatic mitral (valve) insufficiency: Secondary | ICD-10-CM | POA: Diagnosis not present

## 2016-01-19 DIAGNOSIS — I2510 Atherosclerotic heart disease of native coronary artery without angina pectoris: Secondary | ICD-10-CM | POA: Diagnosis not present

## 2016-01-19 DIAGNOSIS — Z720 Tobacco use: Secondary | ICD-10-CM | POA: Diagnosis not present

## 2016-01-19 DIAGNOSIS — E785 Hyperlipidemia, unspecified: Secondary | ICD-10-CM | POA: Diagnosis not present

## 2016-01-19 DIAGNOSIS — E669 Obesity, unspecified: Secondary | ICD-10-CM | POA: Insufficient documentation

## 2016-01-19 LAB — LIPID PANEL
CHOL/HDL RATIO: 3.8 ratio (ref <=5.0)
CHOLESTEROL: 126 mg/dL (ref 125–200)
HDL: 33 mg/dL — AB (ref >=46)
LDL Cholesterol: 52 mg/dL (ref <130)
Triglycerides: 206 mg/dL — ABNORMAL HIGH (ref <150)
VLDL: 41 mg/dL — ABNORMAL HIGH (ref <30)

## 2016-01-19 LAB — HEPATIC FUNCTION PANEL
ALBUMIN: 3.6 g/dL (ref 3.6–5.1)
ALT: 14 U/L (ref 6–29)
AST: 15 U/L (ref 10–35)
Alkaline Phosphatase: 78 U/L (ref 33–130)
BILIRUBIN TOTAL: 0.4 mg/dL (ref 0.2–1.2)
Bilirubin, Direct: 0.1 mg/dL (ref <=0.2)
Indirect Bilirubin: 0.3 mg/dL (ref 0.2–1.2)
Total Protein: 6.1 g/dL (ref 6.1–8.1)

## 2016-04-16 ENCOUNTER — Telehealth: Payer: Self-pay | Admitting: Cardiovascular Disease

## 2016-04-16 NOTE — Telephone Encounter (Signed)
New message  Has problems w/left leg/swelling/given lyrica/foot numb/shin is numb  Over 2 months w/pain  Sees PA for type II diabetes  Went to vein doctor  Stress test was given then sonagram  Told to wear compression stockings   Can't get doctor to call her back/Dr. Zachary/[place] [phone]  Please call patient back w/concerns

## 2016-04-16 NOTE — Telephone Encounter (Signed)
I spoke with pt. She states her leg is not swelling anymore but is painful. States about 2 months ago she developed heaviness in left leg.  [place] No blood clot noted. Circulation OK per pt.  Noted to have " leaky veins" and sent to see vein doctor.  Compression stockings have been ordered. May possibly be set up for some type of procedure but she does not know name of procedure.   She is not happy with practice she is seeing for this and is asking if Dr. Clifton JamesMcAlhany is a vein doctor.  I explained to pt Dr. Clifton JamesMcAlhany is not a vein doctor but we have doctors in our practice that follow PAD.  I asked her to check with her primary care doctor for a recommendation to another vein doctor.   Pt is also waiting to get into pain clinic and is asking if OK to take tylenol.  I told her tylenol is OK but she should follow label instructions.  She is asking what else she can take for pain and I asked her to follow up with primary care for this. Pt states she has talked with Dr. Clifton JamesMcAlhany in past about [facility] as it is closer to her home.  I told her we would make referral for her. She last saw Dr. Clifton JamesMcAlhany in May 2017 with one year follow up planned.  She would like [place] be seen in [place] in [place].  [facility].

## 2016-04-19 NOTE — Telephone Encounter (Signed)
Agree. She could be referred to Dr. Arbie CookeyEarly or Dr. Hart RochesterLawson in [facility] if her primary care dr can help with this. Thayer Ohmhris

## 2016-04-19 NOTE — Telephone Encounter (Signed)
Pt notified.  I asked her to have her primary care doctor make referral to [facility] if she would like to see them.

## 2016-10-04 ENCOUNTER — Encounter: Payer: Self-pay | Admitting: Cardiology

## 2016-10-04 ENCOUNTER — Encounter: Payer: Self-pay | Admitting: *Deleted

## 2016-10-04 ENCOUNTER — Ambulatory Visit (INDEPENDENT_AMBULATORY_CARE_PROVIDER_SITE_OTHER): Payer: Medicare Other | Admitting: Cardiology

## 2016-10-04 VITALS — BP 120/60 | HR 58 | Ht 60.0 in | Wt 131.0 lb

## 2016-10-04 DIAGNOSIS — M7989 Other specified soft tissue disorders: Secondary | ICD-10-CM | POA: Diagnosis not present

## 2016-10-04 DIAGNOSIS — E782 Mixed hyperlipidemia: Secondary | ICD-10-CM

## 2016-10-04 DIAGNOSIS — I2510 Atherosclerotic heart disease of native coronary artery without angina pectoris: Secondary | ICD-10-CM

## 2016-10-04 DIAGNOSIS — I10 Essential (primary) hypertension: Secondary | ICD-10-CM | POA: Diagnosis not present

## 2016-10-04 DIAGNOSIS — I6523 Occlusion and stenosis of bilateral carotid arteries: Secondary | ICD-10-CM | POA: Diagnosis not present

## 2016-10-04 DIAGNOSIS — I5022 Chronic systolic (congestive) heart failure: Secondary | ICD-10-CM | POA: Diagnosis not present

## 2016-10-04 MED ORDER — SIMVASTATIN 20 MG PO TABS: 20.0000 mg | ORAL_TABLET | Freq: Every day | ORAL | 1 refills | Status: DC

## 2016-10-04 MED ORDER — AMLODIPINE BESYLATE 5 MG PO TABS: 5.0000 mg | ORAL_TABLET | Freq: Every day | ORAL | 1 refills | Status: DC

## 2016-10-04 MED ORDER — LISINOPRIL 10 MG PO TABS: 10.0000 mg | ORAL_TABLET | Freq: Every day | ORAL | 1 refills | Status: DC

## 2016-10-04 MED ORDER — CARVEDILOL 12.5 MG PO TABS: ORAL_TABLET | ORAL | 1 refills | Status: DC

## 2016-10-04 NOTE — Progress Notes (Signed)
Clinical Summary Ms. Stovall is a 71 y.o.female former patient of Dr Clifton James, she has transferred to our office which is closer to home.  1. CAD - history of stenting to RCA in 2001, stent to LAD in 2009 in setting of NSTEMI. Cath in 2009 showed RCA stent was occluded but had good collateral flow, managed medically. Stress in 2013 showed inferior scar with some ischemia consistent with her RCA disease, managed medically.  - echo 01/2016 LVEF 35-40%.   - no recent chest pain. No SOB or DOE - compliant with meds.  - reports stress test 03/2016 in [place], [place]. She ins unsure of results.     2. Chronic systolic HF/ICM - no recent SOB/DOE - compliant with meds.  - medical therapy has been limited by soft bp's and low heart rates.   - limiting sodium intake. Very limited NSAIDs.   3.History of Lymphoma  4. HTN - pcp lowered norvasc to  daily recently due to low bp's - no recent lightheadedness or dizziness.    4. Hyperlipidemia - 01/2016: TC 126 TG 206 HDL 33 LDL 52 - compliant with statin   5. History of ASD/VSD repair  6. Carotid stenosis - 09/2015 carotid US: RICA 60-79%, LICA 40-59%.  - no recent neuro symptoms.   7. LE edema - she had had procedures with cauterizing recently for leg swelling - she is interetested in changing vascular doctors     Past Medical History:  Diagnosis Date  . Atrial septal defect    hx of it  . CAD (coronary artery disease)    s/p prior stenting of the right coronary artery in '01 and non-ST-elevation myocardial infarction in jan '09, with a new bare-metal stent placed in the ostium of the left anterior descending coronary artert and the coronary anatomy as described above with total occlusion of the previously placed right coronary artery stent. EF 35-40%  . History of cervical cancer   . History of non-Hodgkin's lymphoma   . HTN (hypertension)   . HTN (hypertension)   . Hyperlipidemia   . Other and unspecified hyperlipidemia    . Personal history of malignant neoplasm of cervix uteri   . Personal history of other lymphatic and hematopoietic neoplasm    hodgkins lymphoma     No Known Allergies   Current Outpatient Prescriptions  Medication Sig Dispense Refill  . amLODipine (NORVASC) 10 MG tablet TAKE 1 TABLET (10 MG TOTAL) BY MOUTH DAILY. 90 tablet 3  . aspirin 325 MG tablet Take 325 mg by mouth daily.    . carvedilol (COREG) 12.5 MG tablet TAKE 1 TABLET (12.5 MG TOTAL) BY MOUTH 2 (TWO) TIMES DAILY. 180 tablet 3  . lisinopril (PRINIVIL,ZESTRIL) 10 MG tablet Take 1 tablet (10 mg total) by mouth daily. 90 tablet 3  . nitroGLYCERIN (NITROSTAT) 0.4 MG SL tablet Place 1 tablet (0.4 mg total) under the tongue every 5 (five) minutes as needed for chest pain (for chest pain). 25 tablet 6  . simvastatin (ZOCOR) 20 MG tablet Take 1 tablet (20 mg total) by mouth daily. 90 tablet 3   No current facility-administered medications for this visit.      Past Surgical History:  Procedure Laterality Date  . ASD AND VSD REPAIR       No Known Allergies    Family History  Problem Relation Age of Onset  . Leukemia Father   . Heart Problems Mother   . Heart attack Sister  Social History Ms. Vangorden reports that she has been smoking Cigarettes.  She has a 50.00 pack-year smoking history. Her smokeless tobacco use includes Chew. Ms. Hingle reports that she does not drink alcohol.   Review of Systems CONSTITUTIONAL: No weight loss, fever, chills, weakness or fatigue.  HEENT: Eyes: No visual loss, blurred vision, double vision or yellow sclerae.No hearing loss, sneezing, congestion, runny nose or sore throat.  SKIN: No rash or itching.  CARDIOVASCULAR: per HPI RESPIRATORY: No shortness of breath, cough or sputum.  GASTROINTESTINAL: No anorexia, nausea, vomiting or diarrhea. No abdominal pain or blood.  GENITOURINARY: No burning on urination, no polyuria NEUROLOGICAL: No headache, dizziness, syncope,  paralysis, ataxia, numbness or tingling in the extremities. No change in bowel or bladder control.  MUSCULOSKELETAL: No muscle, back pain, joint pain or stiffness.  LYMPHATICS: No enlarged nodes. No history of splenectomy.  PSYCHIATRIC: No history of depression or anxiety.  ENDOCRINOLOGIC: No reports of sweating, cold or heat intolerance. No polyuria or polydipsia.  Marland Kitchen   Physical Examination Vitals:   10/04/16 1332  BP: 120/60  Pulse: (!) 58   Vitals:   10/04/16 1332  Weight: 131 lb (59.4 kg)  Height: 5' (1.524 m)    Gen: resting comfortably, no acute distress HEENT: no scleral icterus, pupils equal round and reactive, no palptable cervical adenopathy,  CV: RRR, no m/r/g, no jvd Resp: Clear to auscultation bilaterally GI: abdomen is soft, non-tender, non-distended, normal bowel sounds, no hepatosplenomegaly MSK: extremities are warm, no edema.  Skin: warm, no rash Neuro:  no focal deficits Psych: appropriate affect   Diagnostic Studies  01/2016 echo: LVEF 35-40%, grade I diastolic dysfunction    Assessment and Plan  1. CAD - no recent symptoms - request prior stress test from [place], [place] - continue current meds, but change ASA to  daily  2. Chronic systolic HF/ICM - no recent symptoms - medical therapy limited by recent low bp's, continue current meds  3. HTN - at goal, continue current meds. She reports pcp lowered norvasc recently due to hypotension  4. Hyperlipidemia - at goal, continue statin  5. Carotid stenosis - repeat carotid US  6. LE edema - recent lower extremity venous interventions, she is interested in establishing with a new vascular doctor. We will refer to [facility]   F/u 3 months     Antoine Poche, M.D.

## 2016-10-04 NOTE — Patient Instructions (Signed)
Your physician recommends that you schedule a follow-up appointment in: 3 MONTHS WITH DR [facility]  Your physician has recommended you make the following change in your medication:   DECREASE ASPIRIN 81 MG DAILY  Your physician has requested that you have a carotid duplex. This test is an ultrasound of the carotid arteries in your neck. It looks at blood flow through these arteries that supply the brain with blood. Allow one hour for this exam. There are no restrictions or special instructions.  You have been referred to VASCULAR SURGERY   Thank you for choosing [facility]!!

## 2016-10-07 ENCOUNTER — Telehealth: Payer: Self-pay | Admitting: Cardiology

## 2016-10-07 NOTE — Telephone Encounter (Signed)
Noted and changed pharmacy

## 2016-10-07 NOTE — Telephone Encounter (Signed)
MEDICAtion needs to be sent to [facility] next time she come in July

## 2016-10-21 ENCOUNTER — Ambulatory Visit: Payer: Medicare Other

## 2016-10-21 DIAGNOSIS — I6523 Occlusion and stenosis of bilateral carotid arteries: Secondary | ICD-10-CM

## 2016-10-28 ENCOUNTER — Telehealth: Payer: Self-pay | Admitting: *Deleted

## 2016-10-28 NOTE — Telephone Encounter (Signed)
-----   Message from Antoine PocheJonathan F Branch, MD sent at 10/27/2016  2:00 PM EDT ----- Carotid US shows moderate bilateral blockages, we will continue to monitor  Dominga FerryJ Branch MD

## 2016-10-28 NOTE — Telephone Encounter (Signed)
Pt aware - routed to pcp  

## 2016-11-01 ENCOUNTER — Encounter: Payer: Self-pay | Admitting: Cardiology

## 2016-11-24 ENCOUNTER — Encounter: Payer: Self-pay | Admitting: Surgery

## 2016-11-29 ENCOUNTER — Other Ambulatory Visit: Payer: Self-pay

## 2016-11-29 DIAGNOSIS — M7989 Other specified soft tissue disorders: Secondary | ICD-10-CM

## 2016-12-06 ENCOUNTER — Encounter: Payer: Self-pay | Admitting: Surgery

## 2016-12-06 ENCOUNTER — Ambulatory Visit (HOSPITAL_COMMUNITY)
Admission: RE | Admit: 2016-12-06 | Discharge: 2016-12-06 | Disposition: A | Discharge status: Home / Self Care | Payer: Medicare Other | Source: Ambulatory Visit | Attending: Surgery | Admitting: Surgery

## 2016-12-06 ENCOUNTER — Other Ambulatory Visit: Payer: Self-pay

## 2016-12-06 ENCOUNTER — Ambulatory Visit (INDEPENDENT_AMBULATORY_CARE_PROVIDER_SITE_OTHER): Payer: Medicare Other | Admitting: Surgery

## 2016-12-06 VITALS — BP 140/66 | HR 47 | Temp 97.9°F | Resp 16 | Ht 60.0 in | Wt 132.0 lb

## 2016-12-06 DIAGNOSIS — I8391 Asymptomatic varicose veins of right lower extremity: Secondary | ICD-10-CM | POA: Insufficient documentation

## 2016-12-06 DIAGNOSIS — I739 Peripheral vascular disease, unspecified: Secondary | ICD-10-CM

## 2016-12-06 DIAGNOSIS — I70213 Atherosclerosis of native arteries of extremities with intermittent claudication, bilateral legs: Secondary | ICD-10-CM

## 2016-12-06 DIAGNOSIS — I6523 Occlusion and stenosis of bilateral carotid arteries: Secondary | ICD-10-CM

## 2016-12-06 DIAGNOSIS — M7989 Other specified soft tissue disorders: Secondary | ICD-10-CM | POA: Diagnosis present

## 2016-12-06 NOTE — Progress Notes (Signed)
 [facility] [place]  Patient name: Charlotte Salas        MRN: [id number]        DOB: 09/27/1945          Sex: female   REQUESTING PROVIDER:    Dr. Branch   REASON FOR CONSULT:    Leg pain  HISTORY OF PRESENT ILLNESS:   Charlotte Salas is a 71 y.o. female, who is Referred today for evaluation of leg pain.  The patient reports having had occult he with burning and numbness and some swelling in both of her legs earlier this year.  In [place] she underwent venous ablation of the left great and small saphenous vein as well as the right great and small saphenous vein.  She continues to have challenges with her legs.  In addition, she states that when walking approximately 100 feet she will have significant hip pain.  She does not have any open wounds.  The patient is a diabetic.  Her A1c ranges 5.8-6.8.  She has a history of coronary artery disease and is status post non-STEMI.  She has been stented in the past.  She is medically managed for hypertension with ACE inhibitor.  She takes a statin for hypercholesterolemia.  She is a current smoker.  PAST MEDICAL HISTORY        Past Medical History:  Diagnosis Date  . Atrial septal defect    hx of it  . CAD (coronary artery disease)    s/p prior stenting of the right coronary artery in '01 and non-ST-elevation myocardial infarction in jan '09, with a new bare-metal stent placed in the ostium of the left anterior descending coronary artert and the coronary anatomy as described above with total occlusion of the previously placed right coronary artery stent. EF 35-40%  . History of cervical cancer   . History of non-Hodgkin's lymphoma   . HTN (hypertension)   . HTN (hypertension)   . Hyperlipidemia   . Other and unspecified hyperlipidemia   . Personal history of malignant neoplasm of cervix uteri   . Personal history of other lymphatic and hematopoietic neoplasm     hodgkins lymphoma     FAMILY HISTORY        Family History  Problem Relation Age of Onset  . Leukemia Father   . Heart Problems Mother   . Heart attack Sister     SOCIAL HISTORY:   Social History        Social History  . Marital status: Divorced    Spouse name: N/A  . Number of children: N/A  . Years of education: N/A      Occupational History  . Not on file.         Social History Main Topics  . Smoking status: Current Every Day Smoker    Packs/day: 1.00    Years: 50.00    Types: Cigarettes, E-cigarettes  . Smokeless tobacco: Current User    Types: Chew     Comment: 1 ppd  . Alcohol use No  . Drug use: No  . Sexual activity: Not on file       Other Topics Concern  . Not on file      Social History Narrative   Lives in [place], [place] alone.   Tracer study.     ALLERGIES:    No Known Allergies  CURRENT MEDICATIONS:          Current Outpatient Prescriptions  Medication Sig   Dispense Refill  . aspirin EC 81 MG tablet Take 81 mg by mouth daily.    . B Complex Vitamins (B-COMPLEX/B-12 PO) Take by mouth.    . carvedilol (COREG) 12.5 MG tablet TAKE 1 TABLET (12.5 MG TOTAL) BY MOUTH 2 (TWO) TIMES DAILY. 180 tablet 1  . Cranberry 1000 MG CAPS Take by mouth.    . gabapentin (NEURONTIN) 300 MG capsule Take 300 mg by mouth 3 (three) times daily.    . lisinopril (PRINIVIL,ZESTRIL) 10 MG tablet Take 1 tablet (10 mg total) by mouth daily. 90 tablet 1  . metFORMIN (GLUCOPHAGE) 500 MG tablet Take by mouth 2 (two) times daily with a meal.    . nitroGLYCERIN (NITROSTAT) 0.4 MG SL tablet Place 1 tablet (0.4 mg total) under the tongue every 5 (five) minutes as needed for chest pain (for chest pain). 25 tablet 6  . omega-3 acid ethyl esters (LOVAZA) 1 g capsule Take by mouth 2 (two) times daily.    . simvastatin (ZOCOR) 20 MG tablet Take 1 tablet (20 mg total) by mouth daily. 90 tablet 1  . traMADol (ULTRAM) 50 MG tablet  Take by mouth every 6 (six) hours as needed.     No current facility-administered medications for this visit.     REVIEW OF SYSTEMS:   [X] denotes positive finding, [ ] denotes negative finding Cardiac  Comments:  Chest pain or chest pressure:    Shortness of breath upon exertion:    Short of breath when lying flat:    Irregular heart rhythm:        Vascular    Pain in calf, thigh, or hip brought on by ambulation: x   Pain in feet at night that wakes you up from your sleep:     Blood clot in your veins:    Leg swelling:         Pulmonary    Oxygen at home:    Productive cough:     Wheezing:         Neurologic    Sudden weakness in arms or legs:     Sudden numbness in arms or legs:     Sudden onset of difficulty speaking or slurred speech:    Temporary loss of vision in one eye:     Problems with dizziness:         Gastrointestinal    Blood in stool:      Vomited blood:         Genitourinary    Burning when urinating:     Blood in urine:        Psychiatric    Major depression:         Hematologic    Bleeding problems:    Problems with blood clotting too easily:        Skin    Rashes or ulcers:        Constitutional    Fever or chills:     PHYSICAL EXAM:      Vitals:   12/06/16 1510  BP: 140/66  Pulse: (!) 47  Resp: 16  Temp: 97.9 F (36.6 C)  TempSrc: Oral  SpO2: 98%  Weight: 132 lb (59.9 kg)  Height: 5' (1.524 m)    GENERAL: The patient is a well-nourished female, in no acute distress. The vital signs are documented above. CARDIAC: There is a regular rate and rhythm.  VASCULAR: I am unable to palpate femoral popliteal or pedal pulses bilaterally.  She has trace   edema bilaterally. PULMONARY: Nonlabored respirations ABDOMEN: Soft and non-tender with normal pitched bowel sounds.  MUSCULOSKELETAL: There are no major deformities or  cyanosis. NEUROLOGIC: No focal weakness or paresthesias are detected. SKIN: There are no ulcers or rashes noted. PSYCHIATRIC: The patient has a normal affect.  STUDIES:   Venous reflux study was performed today.  There is no evidence of DVT.  She does have reflux in the right common femoral and femoral vein.  No flow was detected in the right small and great saphenous vein.  No flow was detected in the left small in great saphenous vein  ABIs: 0.43 on the right with monophasic waveforms.  Absent signals on the left leg pain:   ASSESSMENT and PLAN   Leg pain:  I discussed the ultrasound findings today with the patient.  I believe she is suffering from hip claudication and that is why her hips hurt with activity.  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  This may also be contributing to her neuropathic issues in both legs.  Discussed with the patient and her sister that we could continue with medical management or proceed with angiography to better define her anatomy and to intervene if possible.  Because of the limitation she is having she would like to proceed with angiography.  This will need to be done through a right femoral approach, possibly bilateral.  This is been scheduled for Tuesday, July 31.   Wells Brabham, MD [facility] [place] Tel [phone] Pager [phone]  

## 2016-12-28 ENCOUNTER — Other Ambulatory Visit: Payer: Self-pay

## 2017-01-06 ENCOUNTER — Encounter: Payer: Self-pay | Admitting: *Deleted

## 2017-01-06 ENCOUNTER — Encounter: Payer: Self-pay | Admitting: Cardiology

## 2017-01-06 ENCOUNTER — Ambulatory Visit (INDEPENDENT_AMBULATORY_CARE_PROVIDER_SITE_OTHER): Payer: Medicare Other | Admitting: Cardiology

## 2017-01-06 VITALS — BP 150/70 | HR 52 | Ht 60.0 in | Wt 133.0 lb

## 2017-01-06 DIAGNOSIS — I6523 Occlusion and stenosis of bilateral carotid arteries: Secondary | ICD-10-CM | POA: Diagnosis not present

## 2017-01-06 DIAGNOSIS — E782 Mixed hyperlipidemia: Secondary | ICD-10-CM

## 2017-01-06 DIAGNOSIS — I10 Essential (primary) hypertension: Secondary | ICD-10-CM

## 2017-01-06 DIAGNOSIS — I5022 Chronic systolic (congestive) heart failure: Secondary | ICD-10-CM | POA: Diagnosis not present

## 2017-01-06 DIAGNOSIS — I2510 Atherosclerotic heart disease of native coronary artery without angina pectoris: Secondary | ICD-10-CM | POA: Diagnosis not present

## 2017-01-06 MED ORDER — SIMVASTATIN 20 MG PO TABS: 20.0000 mg | ORAL_TABLET | Freq: Every day | ORAL | 1 refills | Status: DC

## 2017-01-06 MED ORDER — CARVEDILOL 12.5 MG PO TABS: ORAL_TABLET | ORAL | 1 refills | Status: DC

## 2017-01-06 MED ORDER — LISINOPRIL 10 MG PO TABS: 10.0000 mg | ORAL_TABLET | Freq: Every day | ORAL | 1 refills | Status: DC

## 2017-01-06 NOTE — Progress Notes (Addendum)
Clinical Summary [name] is a 71 y.o.female seen today for follow up of the following medical problems.    1. CAD - history of stenting to RCA in 2001, stent to LAD in 2009 in setting of NSTEMI. Cath in 2009 showed RCA stent was occluded but had good collateral flow, managed medically. Stress in 2013 showed inferior scar with some ischemia consistent with her RCA disease, managed medically.  - echo 01/2016 LVEF 35-40%.    - no recent chest pain, no SOB or DOe - ran out of meds recently   2. Chronic systolic HF/ICM - echo 01/2016 LVEF 35-40%.  - medical therapy has been limited by soft bp's and low heart rates.   - limiting sodium intake and NSAIDs - no recent symptoms   3.History of Lymphoma  4. HTN - pcp lowered norvasc to 5mg  daily recently due to low bp's - no recent lightheadedness or dizziness.    5. Hyperlipidemia - 01/2016: TC 126 TG 206 HDL 33 LDL 52 - compliant with statin, difficultly with cost of meds has continued simvastatin  5. History of ASD/VSD repair  6. Carotid stenosis - 09/2015 carotid US: RICA 60-79%, LICA 40-59%.  - 10/2016 bilateral 40-59% ICA disease  - no recent neuro symptoms.   7. LE edema - she had had procedures with cauterizing recently for leg swelling - followed by vasuclar  8. PAD  - followed bv vascular - has upcoming angiogram  Past Medical History:  Diagnosis Date  . Atrial septal defect    hx of it  . CAD (coronary artery disease)    s/p prior stenting of the right coronary artery in '01 and non-ST-elevation myocardial infarction in jan '09, with a new bare-metal stent placed in the ostium of the left anterior descending coronary artert and the coronary anatomy as described above with total occlusion of the previously placed right coronary artery stent. EF 35-40%  . History of cervical cancer   . History of non-Hodgkin's lymphoma   . HTN (hypertension)   . HTN (hypertension)   . Hyperlipidemia   . Other and  unspecified hyperlipidemia   . Personal history of malignant neoplasm of cervix uteri   . Personal history of other lymphatic and hematopoietic neoplasm    hodgkins lymphoma     Allergies  Allergen Reactions  . Codeine Nausea Only     Current Outpatient Prescriptions  Medication Sig Dispense Refill  . aspirin EC 81 MG tablet Take 81 mg by mouth daily.    . B Complex Vitamins (B-COMPLEX/B-12 PO) Take 1 tablet by mouth daily.     . carvedilol (COREG) 12.5 MG tablet TAKE 1 TABLET (12.5 MG TOTAL) BY MOUTH 2 (TWO) TIMES DAILY. 180 tablet 1  . Cranberry 1000 MG CAPS Take 1 capsule by mouth daily.     Marland Kitchen. gabapentin (NEURONTIN) 300 MG capsule Take 300 mg by mouth 3 (three) times daily.    Marland Kitchen. lisinopril (PRINIVIL,ZESTRIL) 10 MG tablet Take 1 tablet (10 mg total) by mouth daily. 90 tablet 1  . metFORMIN (GLUCOPHAGE) 500 MG tablet Take by mouth 2 (two) times daily with a meal.    . nitroGLYCERIN (NITROSTAT) 0.4 MG SL tablet Place 1 tablet (0.4 mg total) under the tongue every 5 (five) minutes as needed for chest pain (for chest pain). 25 tablet 6  . omega-3 acid ethyl esters (LOVAZA) 1 g capsule Take by mouth 2 (two) times daily.    . pregabalin (LYRICA) 75 MG capsule  Take 75 mg by mouth 2 (two) times daily.    . simvastatin (ZOCOR) 20 MG tablet Take 1 tablet (20 mg total) by mouth daily. 90 tablet 1  . traMADol (ULTRAM) 50 MG tablet Take 50 mg by mouth every 6 (six) hours as needed for moderate pain or severe pain.      No current facility-administered medications for this visit.      Past Surgical History:  Procedure Laterality Date  . ASD AND VSD REPAIR       Allergies  Allergen Reactions  . Codeine Nausea Only      Family History  Problem Relation Age of Onset  . Leukemia Father   . Heart Problems Mother   . Heart attack Sister      Social History Charlotte Salas reports that she has been smoking Cigarettes and E-cigarettes.  She has a 50.00 pack-year smoking history. Her  smokeless tobacco use includes Chew. [name] reports that she does not drink alcohol.   Review of Systems CONSTITUTIONAL: No weight loss, fever, chills, weakness or fatigue.  HEENT: Eyes: No visual loss, blurred vision, double vision or yellow sclerae.No hearing loss, sneezing, congestion, runny nose or sore throat.  SKIN: No rash or itching.  CARDIOVASCULAR: per hpi RESPIRATORY: No shortness of breath, cough or sputum.  GASTROINTESTINAL: No anorexia, nausea, vomiting or diarrhea. No abdominal pain or blood.  GENITOURINARY: No burning on urination, no polyuria NEUROLOGICAL: No headache, dizziness, syncope, paralysis, ataxia, numbness or tingling in the extremities. No change in bowel or bladder control.  MUSCULOSKELETAL: No muscle, back pain, joint pain or stiffness.  LYMPHATICS: No enlarged nodes. No history of splenectomy.  PSYCHIATRIC: No history of depression or anxiety.  ENDOCRINOLOGIC: No reports of sweating, cold or heat intolerance. No polyuria or polydipsia.  Marland Kitchen.   Physical Examination Vitals:   01/06/17 1257  BP: (!) 150/70  Pulse: (!) 52   Vitals:   01/06/17 1257  Weight: 133 lb (60.3 kg)  Height: 5' (1.524 m)    Gen: resting comfortably, no acute distress HEENT: no scleral icterus, pupils equal round and reactive, no palptable cervical adenopathy,  CV: RRR, no m/r/g, no jvd Resp: Clear to auscultation bilaterally GI: abdomen is soft, non-tender, non-distended, normal bowel sounds, no hepatosplenomegaly MSK: extremities are warm, no edema.  Skin: warm, no rash Neuro:  no focal deficits Psych: appropriate affect   Diagnostic Studies  01/2016 echo: LVEF 35-40%, grade I diastolic dysfunction  10/2016 Carotid US 40-59% bilateral ICA disease  Assessment and Plan  1. CAD - no recent symptoms - no recent symptoms - continue current meds  2. Chronic systolic HF/ICM - no recent symptoms - medical therapy limited by recent low bp's - she will continue  current meds  3. HTN - elevated in clinic, she has not taken meds yet today - continue to monitor  4. Hyperlipidemia - lipids at goal, continue statin. Has been on simva due to cost.   5. Carotid stenosis - follow w/ vascular.   6. LE edema - f/u with vascular      Antoine PocheJonathan F. Adena Sima, M.D.  01/31/17 Addendum Patient planned for vascular surgery. She reports she can walk up a flight of stairs without symptoms, can walk over 2 blocks with significant limitaiton. Stress test in 2013 with moderate peri-infarct ischemia. She tolerates greater than 4 METs without significant limitaiton. In this setting there is not indication for further ischemic testing or coronary intervention. Recommend proceeding with vascular surgery as planned.  Christiane HaJonathan  Antiono Ettinger MD

## 2017-01-06 NOTE — Patient Instructions (Signed)

## 2017-01-11 ENCOUNTER — Encounter (HOSPITAL_COMMUNITY)
Admission: RE | Disposition: A | Discharge status: Home / Self Care | Payer: Self-pay | Source: Ambulatory Visit | Attending: Surgery

## 2017-01-11 ENCOUNTER — Other Ambulatory Visit: Payer: Self-pay

## 2017-01-11 ENCOUNTER — Ambulatory Visit (HOSPITAL_COMMUNITY)
Admission: RE | Admit: 2017-01-11 | Discharge: 2017-01-11 | Disposition: A | Discharge status: Home / Self Care | Payer: Medicare Other | Source: Ambulatory Visit | Attending: Surgery | Admitting: Surgery

## 2017-01-11 DIAGNOSIS — E7800 Pure hypercholesterolemia, unspecified: Secondary | ICD-10-CM | POA: Insufficient documentation

## 2017-01-11 DIAGNOSIS — I700 Atherosclerosis of aorta: Secondary | ICD-10-CM | POA: Diagnosis not present

## 2017-01-11 DIAGNOSIS — I70201 Unspecified atherosclerosis of native arteries of extremities, right leg: Secondary | ICD-10-CM | POA: Diagnosis not present

## 2017-01-11 DIAGNOSIS — Z955 Presence of coronary angioplasty implant and graft: Secondary | ICD-10-CM | POA: Diagnosis not present

## 2017-01-11 DIAGNOSIS — I70212 Atherosclerosis of native arteries of extremities with intermittent claudication, left leg: Secondary | ICD-10-CM | POA: Insufficient documentation

## 2017-01-11 DIAGNOSIS — Z7982 Long term (current) use of aspirin: Secondary | ICD-10-CM | POA: Diagnosis not present

## 2017-01-11 DIAGNOSIS — F17210 Nicotine dependence, cigarettes, uncomplicated: Secondary | ICD-10-CM | POA: Diagnosis not present

## 2017-01-11 DIAGNOSIS — I2510 Atherosclerotic heart disease of native coronary artery without angina pectoris: Secondary | ICD-10-CM | POA: Insufficient documentation

## 2017-01-11 DIAGNOSIS — I252 Old myocardial infarction: Secondary | ICD-10-CM | POA: Diagnosis not present

## 2017-01-11 DIAGNOSIS — Z01812 Encounter for preprocedural laboratory examination: Secondary | ICD-10-CM

## 2017-01-11 DIAGNOSIS — I10 Essential (primary) hypertension: Secondary | ICD-10-CM | POA: Diagnosis not present

## 2017-01-11 DIAGNOSIS — E1151 Type 2 diabetes mellitus with diabetic peripheral angiopathy without gangrene: Secondary | ICD-10-CM | POA: Insufficient documentation

## 2017-01-11 DIAGNOSIS — Z7984 Long term (current) use of oral hypoglycemic drugs: Secondary | ICD-10-CM | POA: Diagnosis not present

## 2017-01-11 DIAGNOSIS — I739 Peripheral vascular disease, unspecified: Secondary | ICD-10-CM

## 2017-01-11 HISTORY — PX: LOWER EXTREMITY ANGIOGRAPHY: CATH118251

## 2017-01-11 HISTORY — PX: ABDOMINAL AORTOGRAM: CATH118222

## 2017-01-11 LAB — POCT I-STAT, CHEM 8
BUN: 29 mg/dL — ABNORMAL HIGH (ref 6–20)
CREATININE: 0.9 mg/dL (ref 0.44–1.00)
Calcium, Ion: 1.29 mmol/L (ref 1.15–1.40)
Chloride: 105 mmol/L (ref 101–111)
GLUCOSE: 92 mg/dL (ref 65–99)
HCT: 39 % (ref 36.0–46.0)
HEMOGLOBIN: 13.3 g/dL (ref 12.0–15.0)
Potassium: 4.6 mmol/L (ref 3.5–5.1)
Sodium: 142 mmol/L (ref 135–145)
TCO2: 28 mmol/L (ref 0–100)

## 2017-01-11 LAB — GLUCOSE, CAPILLARY: Glucose-Capillary: 78 mg/dL (ref 65–99)

## 2017-01-11 SURGERY — ABDOMINAL AORTOGRAM
Anesthesia: LOCAL

## 2017-01-11 MED ORDER — LIDOCAINE HCL (PF) 1 % IJ SOLN
INTRAMUSCULAR | Status: DC | PRN
  Administered 2017-01-11: 18 mL

## 2017-01-11 MED ORDER — IODIXANOL 320 MG/ML IV SOLN
INTRAVENOUS | Status: DC | PRN
  Administered 2017-01-11: 115 mL via INTRAVENOUS

## 2017-01-11 MED ORDER — DOCUSATE SODIUM 100 MG PO CAPS: 100.0000 mg | ORAL_CAPSULE | Freq: Every day | ORAL | Status: DC

## 2017-01-11 MED ORDER — METOPROLOL TARTRATE 5 MG/5ML IV SOLN: 2.0000 mg | INTRAVENOUS | Status: DC | PRN

## 2017-01-11 MED ORDER — HEPARIN (PORCINE) IN NACL 2-0.9 UNIT/ML-% IJ SOLN
INTRAMUSCULAR | Status: DC | PRN
  Administered 2017-01-11: 1000 mL

## 2017-01-11 MED ORDER — SODIUM CHLORIDE 0.9 % IV SOLN: 500.0000 mL | Freq: Once | INTRAVENOUS | Status: DC | PRN

## 2017-01-11 MED ORDER — LIDOCAINE HCL (PF) 1 % IJ SOLN
INTRAMUSCULAR | Status: AC
  Filled 2017-01-11: qty 30

## 2017-01-11 MED ORDER — OXYCODONE HCL 5 MG PO TABS: 5.0000 mg | ORAL_TABLET | ORAL | Status: DC | PRN

## 2017-01-11 MED ORDER — ALUM & MAG HYDROXIDE-SIMETH 200-200-20 MG/5ML PO SUSP: 15.0000 mL | ORAL | Status: DC | PRN

## 2017-01-11 MED ORDER — FENTANYL CITRATE (PF) 100 MCG/2ML IJ SOLN
INTRAMUSCULAR | Status: DC | PRN
  Administered 2017-01-11 (×2): 25 ug via INTRAVENOUS

## 2017-01-11 MED ORDER — ONDANSETRON HCL 4 MG/2ML IJ SOLN: 4.0000 mg | Freq: Four times a day (QID) | INTRAMUSCULAR | Status: DC | PRN

## 2017-01-11 MED ORDER — GUAIFENESIN-DM 100-10 MG/5ML PO SYRP: 15.0000 mL | ORAL_SOLUTION | ORAL | Status: DC | PRN

## 2017-01-11 MED ORDER — FENTANYL CITRATE (PF) 100 MCG/2ML IJ SOLN
INTRAMUSCULAR | Status: AC
  Filled 2017-01-11: qty 2

## 2017-01-11 MED ORDER — LABETALOL HCL 5 MG/ML IV SOLN: 10.0000 mg | INTRAVENOUS | Status: DC | PRN

## 2017-01-11 MED ORDER — MIDAZOLAM HCL 2 MG/2ML IJ SOLN
INTRAMUSCULAR | Status: DC | PRN
  Administered 2017-01-11 (×2): 1 mg via INTRAVENOUS

## 2017-01-11 MED ORDER — MIDAZOLAM HCL 2 MG/2ML IJ SOLN
INTRAMUSCULAR | Status: AC
  Filled 2017-01-11: qty 2

## 2017-01-11 MED ORDER — ACETAMINOPHEN 325 MG PO TABS: 325.0000 mg | ORAL_TABLET | ORAL | Status: DC | PRN

## 2017-01-11 MED ORDER — PHENOL 1.4 % MT LIQD: 1.0000 | OROMUCOSAL | Status: DC | PRN

## 2017-01-11 MED ORDER — HYDRALAZINE HCL 20 MG/ML IJ SOLN: 5.0000 mg | INTRAMUSCULAR | Status: DC | PRN

## 2017-01-11 MED ORDER — SODIUM CHLORIDE 0.9 % IV SOLN: 1.0000 mL/kg/h | INTRAVENOUS | Status: DC

## 2017-01-11 MED ORDER — SODIUM CHLORIDE 0.9 % IV SOLN
INTRAVENOUS | Status: DC
  Administered 2017-01-11: 09:00:00 via INTRAVENOUS

## 2017-01-11 MED ORDER — MORPHINE SULFATE (PF) 10 MG/ML IV SOLN: 2.0000 mg | INTRAVENOUS | Status: DC | PRN

## 2017-01-11 MED ORDER — ACETAMINOPHEN 325 MG RE SUPP: 325.0000 mg | RECTAL | Status: DC | PRN

## 2017-01-11 MED ORDER — HEPARIN (PORCINE) IN NACL 2-0.9 UNIT/ML-% IJ SOLN
INTRAMUSCULAR | Status: AC
  Filled 2017-01-11: qty 1000

## 2017-01-11 SURGICAL SUPPLY — 10 items
CATH OMNI FLUSH 5F 65CM (CATHETERS) ×3 IMPLANT
COVER PRB 48X5XTLSCP FOLD TPE (BAG) ×2 IMPLANT
COVER PROBE 5X48 (BAG) ×1
KIT MICROINTRODUCER STIFF 5F (SHEATH) ×3 IMPLANT
KIT PV (KITS) ×3 IMPLANT
SHEATH PINNACLE 5F 10CM (SHEATH) ×3 IMPLANT
SYR MEDRAD MARK V 150ML (SYRINGE) ×3 IMPLANT
TRANSDUCER W/STOPCOCK (MISCELLANEOUS) ×3 IMPLANT
TRAY PV CATH (CUSTOM PROCEDURE TRAY) ×3 IMPLANT
WIRE BENTSON .035X145CM (WIRE) ×3 IMPLANT

## 2017-01-11 NOTE — Discharge Instructions (Signed)

## 2017-01-11 NOTE — Op Note (Signed)
    Patient name: Charlotte Salas MRN: [id number] DOB: 12-03-1945 Sex: female  01/11/2017 Pre-operative Diagnosis: Left leg claudication Post-operative diagnosis:  Same Surgeon:  Durene CalBrabham, Wells Procedure Performed:  1.  Ultrasound-guided access, right femoral artery  2.  Abdominal aortogram  3.  Bilateral lower extremity runoff  4.  Conscious sedation (17 minutes)    Indications:  The patient is here because she is having significant leg pain on the left and ultrasound identified iliac occlusion.  Procedure:  The patient was identified in the holding area and taken to room 8.  The patient was then placed supine on the table and prepped and draped in the usual sterile fashion.  A time out was called.  Conscious sedation was administered with the use of IV fentanyl and Versed under continuous physician and nurse monitoring.  Heart rate, blood pressure, and oxygen saturation were continuously monitored.  Ultrasound was used to evaluate the right common femoral artery.  It was patent .  A digital ultrasound image was acquired.  A micropuncture needle was used to access the right common femoral artery under ultrasound guidance.  An 018 wire was advanced without resistance and a micropuncture sheath was placed.  The 018 wire was removed and a benson wire was placed.  The micropuncture sheath was exchanged for a 5 french sheath.  An omniflush catheter was advanced over the wire to the level of L-1.  An abdominal angiogram was obtained.  The catheters most down to the aortic bifurcation and pelvic angiography followed by bilateral runoff was performed.  Findings:   Aortogram:  No significant renal artery stenosis.  The infrarenal abdominal aorta is heavily calcified.  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  Heavily calcified iliac arteries bilaterally.  The left iliac artery is occluded at its distal extent.  Significant stenosis is noted in the distal right external iliac artery.  Right Lower  Extremity:  Near occlusive disease at the common femoral bifurcation leading into the profunda and superficial femoral artery.  Mild luminal irregularity is noted in the proximal superficial femoral artery.  The distal superficial femoral-popliteal artery are patent with two-vessel runoff via the peroneal and posterior tibial artery.  Left Lower Extremity:  Reconstitution of a diseased common femoral artery with stenosis at the origins of the superficial femoral and profunda femoral artery as well as adductor canal stenosis within the superficial femoral artery.  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 Durene CalWells Ranesha Val, M.D. [facility] [place] Office: [phone] Pager:  [phone]

## 2017-01-11 NOTE — H&P (Signed)
Vascular and Vein Specialist of [facility]  Patient name: Charlotte Salas        MRN: [id number]        DOB: 06/14/1946          Sex: female   REQUESTING PROVIDER:    Dr. Wyline Mood   REASON FOR CONSULT:    Leg pain  HISTORY OF PRESENT ILLNESS:   Charlotte Salas is a 71 y.o. female, who is Referred today for evaluation of leg pain.  The patient reports having had occult he with burning and numbness and some swelling in both of her legs earlier this year.  In [place] she underwent venous ablation of the left great and small saphenous vein as well as the right great and small saphenous vein.  She continues to have challenges with her legs.  In addition, she states that when walking approximately 100 feet she will have significant hip pain.  She does not have any open wounds.  The patient is a diabetic.  Her A1c ranges 5.8-6.8.  She has a history of coronary artery disease and is status post non-STEMI.  She has been stented in the past.  She is medically managed for hypertension with ACE inhibitor.  She takes a statin for hypercholesterolemia.  She is a current smoker.  PAST MEDICAL HISTORY        Past Medical History:  Diagnosis Date  . Atrial septal defect    hx of it  . CAD (coronary artery disease)    s/p prior stenting of the right coronary artery in '01 and non-ST-elevation myocardial infarction in jan '09, with a new bare-metal stent placed in the ostium of the left anterior descending coronary artert and the coronary anatomy as described above with total occlusion of the previously placed right coronary artery stent. EF 35-40%  . History of cervical cancer   . History of non-Hodgkin's lymphoma   . HTN (hypertension)   . HTN (hypertension)   . Hyperlipidemia   . Other and unspecified hyperlipidemia   . Personal history of malignant neoplasm of cervix uteri   . Personal history of other lymphatic and hematopoietic neoplasm     hodgkins lymphoma     FAMILY HISTORY        Family History  Problem Relation Age of Onset  . Leukemia Father   . Heart Problems Mother   . Heart attack Sister     SOCIAL HISTORY:   Social History        Social History  . Marital status: Divorced    Spouse name: N/A  . Number of children: N/A  . Years of education: N/A      Occupational History  . Not on file.         Social History Main Topics  . Smoking status: Current Every Day Smoker    Packs/day: 1.00    Years: 50.00    Types: Cigarettes, E-cigarettes  . Smokeless tobacco: Current User    Types: Chew     Comment: 1 ppd  . Alcohol use No  . Drug use: No  . Sexual activity: Not on file       Other Topics Concern  . Not on file      Social History Narrative   Lives in [place], [place] alone.   Tracer study.     ALLERGIES:    No Known Allergies  CURRENT MEDICATIONS:          Current Outpatient Prescriptions  Medication Sig  Dispense Refill  . aspirin EC 81 MG tablet Take 81 mg by mouth daily.    . B Complex Vitamins (B-COMPLEX/B-12 PO) Take by mouth.    . carvedilol (COREG) 12.5 MG tablet TAKE 1 TABLET (12.5 MG TOTAL) BY MOUTH 2 (TWO) TIMES DAILY. 180 tablet 1  . Cranberry 1000 MG CAPS Take by mouth.    . gabapentin (NEURONTIN) 300 MG capsule Take 300 mg by mouth 3 (three) times daily.    Marland Kitchen. lisinopril (PRINIVIL,ZESTRIL) 10 MG tablet Take 1 tablet (10 mg total) by mouth daily. 90 tablet 1  . metFORMIN (GLUCOPHAGE) 500 MG tablet Take by mouth 2 (two) times daily with a meal.    . nitroGLYCERIN (NITROSTAT) 0.4 MG SL tablet Place 1 tablet (0.4 mg total) under the tongue every 5 (five) minutes as needed for chest pain (for chest pain). 25 tablet 6  . omega-3 acid ethyl esters (LOVAZA) 1 g capsule Take by mouth 2 (two) times daily.    . simvastatin (ZOCOR) 20 MG tablet Take 1 tablet (20 mg total) by mouth daily. 90 tablet 1  . traMADol (ULTRAM) 50 MG tablet  Take by mouth every 6 (six) hours as needed.     No current facility-administered medications for this visit.     REVIEW OF SYSTEMS:   [X]  denotes positive finding, [ ]  denotes negative finding Cardiac  Comments:  Chest pain or chest pressure:    Shortness of breath upon exertion:    Short of breath when lying flat:    Irregular heart rhythm:        Vascular    Pain in calf, thigh, or hip brought on by ambulation: x   Pain in feet at night that wakes you up from your sleep:     Blood clot in your veins:    Leg swelling:         Pulmonary    Oxygen at home:    Productive cough:     Wheezing:         Neurologic    Sudden weakness in arms or legs:     Sudden numbness in arms or legs:     Sudden onset of difficulty speaking or slurred speech:    Temporary loss of vision in one eye:     Problems with dizziness:         Gastrointestinal    Blood in stool:      Vomited blood:         Genitourinary    Burning when urinating:     Blood in urine:        Psychiatric    Major depression:         Hematologic    Bleeding problems:    Problems with blood clotting too easily:        Skin    Rashes or ulcers:        Constitutional    Fever or chills:     PHYSICAL EXAM:      Vitals:   12/06/16 1510  BP: 140/66  Pulse: (!) 47  Resp: 16  Temp: 97.9 F (36.6 C)  TempSrc: Oral  SpO2: 98%  Weight: 132 lb (59.9 kg)  Height: 5' (1.524 m)    GENERAL: The patient is a well-nourished female, in no acute distress. The vital signs are documented above. CARDIAC: There is a regular rate and rhythm.  VASCULAR: I am unable to palpate femoral popliteal or pedal pulses bilaterally.  She has trace  edema bilaterally. PULMONARY: Nonlabored respirations ABDOMEN: Soft and non-tender with normal pitched bowel sounds.  MUSCULOSKELETAL: There are no major deformities or  cyanosis. NEUROLOGIC: No focal weakness or paresthesias are detected. SKIN: There are no ulcers or rashes noted. PSYCHIATRIC: The patient has a normal affect.  STUDIES:   Venous reflux study was performed today.  There is no evidence of DVT.  She does have reflux in the right common femoral and femoral vein.  No flow was detected in the right small and great saphenous vein.  No flow was detected in the left small in great saphenous vein  ABIs: 0.43 on the right with monophasic waveforms.  Absent signals on the left leg pain:   ASSESSMENT and PLAN   Leg pain:  I discussed the ultrasound findings today with the patient.  I believe she is suffering from hip claudication and that is why her hips hurt with activity.  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  This may also be contributing to her neuropathic issues in both legs.  Discussed with the patient and her sister that we could continue with medical management or proceed with angiography to better define her anatomy and to intervene if possible.  Because of the limitation she is having she would like to proceed with angiography.  This will need to be done through a right femoral approach, possibly bilateral.  This is been scheduled for Tuesday, July 31.   Durene CalWells Carliss Porcaro, MD [facility] Tel [phone] Pager [phone]) [phone]

## 2017-01-12 ENCOUNTER — Encounter (HOSPITAL_COMMUNITY): Payer: Self-pay | Admitting: Surgery

## 2017-01-12 ENCOUNTER — Telehealth: Payer: Self-pay | Admitting: Surgery

## 2017-01-12 NOTE — Telephone Encounter (Signed)
Sched [facility]ts [facility]/17[facility] at 10:30 and MD at 12:15. Lm on hm#.

## 2017-01-12 NOTE — Telephone Encounter (Signed)
-----   Message from Phillips Odorarol S Pullins, RN sent at 01/11/2017  3:09 PM EDT ----- Regarding: needs CTA chest/abd/pelvis, and f/u in 2 weeks with [facility] to discuss surgery   ----- Message ----- From: Nada LibmanBrabham, Vance W, MD Sent: 01/11/2017  11:34 AM To: [facility] Charge Pool  01/11/2017:  Surgeon:  Durene CalBrabham, Wells Procedure Performed:  1.  Ultrasound-guided access, right femoral artery  2.  Abdominal aortogram  3.  Bilateral lower extremity runoff  4.  Conscious sedation (17 minutes)  Patient will need a CT angiogram of the chest abdomen and pelvis and follow-up to see me in 2 weeks to discuss surgical options for aortobifemoral bypass

## 2017-01-21 ENCOUNTER — Encounter: Payer: Self-pay | Admitting: Surgery

## 2017-01-24 ENCOUNTER — Ambulatory Visit
Admission: RE | Admit: 2017-01-24 | Discharge: 2017-01-24 | Disposition: A | Discharge status: Home / Self Care | Payer: Medicare Other | Source: Ambulatory Visit | Attending: Surgery | Admitting: Surgery

## 2017-01-24 ENCOUNTER — Encounter: Payer: Self-pay | Admitting: Surgery

## 2017-01-24 ENCOUNTER — Other Ambulatory Visit: Payer: Self-pay | Admitting: Cardiology

## 2017-01-24 ENCOUNTER — Ambulatory Visit (INDEPENDENT_AMBULATORY_CARE_PROVIDER_SITE_OTHER): Payer: Medicare Other | Admitting: Surgery

## 2017-01-24 ENCOUNTER — Other Ambulatory Visit: Payer: Self-pay

## 2017-01-24 VITALS — BP 132/68 | HR 59 | Temp 97.8°F | Resp 16 | Ht 59.5 in | Wt 134.2 lb

## 2017-01-24 DIAGNOSIS — I714 Abdominal aortic aneurysm, without rupture, unspecified: Secondary | ICD-10-CM

## 2017-01-24 DIAGNOSIS — R911 Solitary pulmonary nodule: Secondary | ICD-10-CM | POA: Diagnosis not present

## 2017-01-24 DIAGNOSIS — Z01812 Encounter for preprocedural laboratory examination: Secondary | ICD-10-CM

## 2017-01-24 DIAGNOSIS — I739 Peripheral vascular disease, unspecified: Secondary | ICD-10-CM

## 2017-01-24 DIAGNOSIS — Z01810 Encounter for preprocedural cardiovascular examination: Secondary | ICD-10-CM

## 2017-01-24 DIAGNOSIS — I6523 Occlusion and stenosis of bilateral carotid arteries: Secondary | ICD-10-CM | POA: Diagnosis not present

## 2017-01-24 DIAGNOSIS — I70213 Atherosclerosis of native arteries of extremities with intermittent claudication, bilateral legs: Secondary | ICD-10-CM | POA: Diagnosis not present

## 2017-01-24 MED ORDER — IOPAMIDOL (ISOVUE-370) INJECTION 76%
100.0000 mL | Freq: Once | INTRAVENOUS | Status: AC | PRN
  Administered 2017-01-24: 100 mL via INTRAVENOUS

## 2017-01-24 NOTE — Telephone Encounter (Signed)
Patient called requesting to have [facility]. She has questions about medications.

## 2017-01-24 NOTE — Progress Notes (Signed)
[facility]  Patient name: Charlotte Salas MRN: [id number] DOB: 06/09/1946 Sex: female   REASON FOR VISIT:    Follow up  HISOTRY OF PRESENT ILLNESS:    Charlotte Salas is a 71 y.o. female who is here today for follow-up of her leg pain.  She reports burning and numbness with swelling in both legs.  Earlier this year she underwent venous ablation of the bilateral great and small saphenous veins and [place].  This did not alleviate her symptoms.  She was having difficulty walking approximately 100 feet a cousin of hip pain.  She underwent diagnostic angiography recently which showed left distal external iliac and common femoral occlusion with severe stenosis within the right common femoral artery.  She is also found to have an abdominal aortic aneurysm.  She is sent for a CT scan to further evaluate this.  She is here today for further discussions.   The patient is a diabetic.  Her A1c ranges 5.8-6.8.  She has a history of coronary artery disease and is status post non-STEMI.  She has been stented in the past.  She is medically managed for hypertension with ACE inhibitor.  She takes a statin for hypercholesterolemia.  She is a current smoker.  PAST MEDICAL HISTORY:   Past Medical History:  Diagnosis Date  . Atrial septal defect    hx of it  . CAD (coronary artery disease)    s/p prior stenting of the right coronary artery in '01 and non-ST-elevation myocardial infarction in jan '09, with a new bare-metal stent placed in the ostium of the left anterior descending coronary artert and the coronary anatomy as described above with total occlusion of the previously placed right coronary artery stent. EF 35-40%  . History of cervical cancer   . History of non-Hodgkin's lymphoma   . HTN (hypertension)   . HTN (hypertension)   . Hyperlipidemia   . Other and unspecified hyperlipidemia   . Personal history of malignant  neoplasm of cervix uteri   . Personal history of other lymphatic and hematopoietic neoplasm    hodgkins lymphoma     FAMILY HISTORY:   Family History  Problem Relation Age of Onset  . Leukemia Father   . Heart Problems Mother   . Heart attack Sister     SOCIAL HISTORY:   Social History  Substance Use Topics  . Smoking status: Current Every Day Smoker    Packs/day: 0.50    Years: 50.00    Types: Cigarettes, E-cigarettes  . Smokeless tobacco: Current User    Types: Chew     Comment: 1 ppd  . Alcohol use No     ALLERGIES:   Allergies  Allergen Reactions  . Codeine Nausea Only     CURRENT MEDICATIONS:   Current Outpatient Prescriptions  Medication Sig Dispense Refill  . aspirin EC 81 MG tablet Take 81 mg by mouth daily.    . B Complex Vitamins (B-COMPLEX/B-12 PO) Take 1 tablet by mouth daily.     [name]. [START ON 01/26/2017] carvedilol (COREG) 12.5 MG tablet TAKE 1 TABLET (12.5 MG TOTAL) BY MOUTH 2 (TWO) TIMES DAILY. 180 tablet 1  . Cranberry 1000 MG CAPS Take 1 capsule by mouth daily.     [name]. [START ON 01/26/2017] lisinopril (PRINIVIL,ZESTRIL) 10 MG tablet Take 1 tablet (10 mg total) by mouth daily. 90 tablet 1  . metFORMIN (GLUCOPHAGE) 500 MG tablet Take by mouth 2 (two) times daily with a  meal.    . omega-3 acid ethyl esters (LOVAZA) 1 g capsule Take by mouth 2 (two) times daily.    . pregabalin (LYRICA) 75 MG capsule Take 75 mg by mouth 2 (two) times daily.    [name]. [START ON 01/26/2017] simvastatin (ZOCOR) 20 MG tablet Take 1 tablet (20 mg total) by mouth daily. 90 tablet 1  . traMADol (ULTRAM) 50 MG tablet Take 50 mg by mouth every 6 (six) hours as needed for moderate pain or severe pain.     . nitroGLYCERIN (NITROSTAT) 0.4 MG SL tablet Place 1 tablet (0.4 mg total) under the tongue every 5 (five) minutes as needed for chest pain (for chest pain). (Patient not taking: Reported on 01/24/2017) 25 tablet 6   No current facility-administered medications for this visit.      REVIEW OF SYSTEMS:   [X]  denotes positive finding, [ ]  denotes negative finding Cardiac  Comments:  Chest pain or chest pressure:    Shortness of breath upon exertion:    Short of breath when lying flat:    Irregular heart rhythm:        Vascular    Pain in calf, thigh, or hip brought on by ambulation: x   Pain in feet at night that wakes you up from your sleep:     Blood clot in your veins:    Leg swelling:         Pulmonary    Oxygen at home:    Productive cough:     Wheezing:         Neurologic    Sudden weakness in arms or legs:     Sudden numbness in arms or legs:     Sudden onset of difficulty speaking or slurred speech:    Temporary loss of vision in one eye:     Problems with dizziness:         Gastrointestinal    Blood in stool:     Vomited blood:         Genitourinary    Burning when urinating:     Blood in urine:        Psychiatric    Major depression:         Hematologic    Bleeding problems:    Problems with blood clotting too easily:        Skin    Rashes or ulcers:        Constitutional    Fever or chills:      PHYSICAL EXAM:   Vitals:   01/24/17 1211  BP: 132/68  Pulse: (!) 59  Resp: 16  Temp: 97.8 F (36.6 C)  TempSrc: Oral  SpO2: 96%  Weight: 134 lb 4 oz (60.9 kg)  Height: 4' 11.5" (1.511 m)    GENERAL: The patient is a well-nourished female, in no acute distress. The vital signs are documented above. CARDIAC: There is a regular rate and rhythm.  VASCULAR: Nonpalpable pedal pulses PULMONARY: Non-labored respirations ABDOMEN: Soft and non-tender MUSCULOSKELETAL: There are no major deformities or cyanosis. NEUROLOGIC: No focal weakness or paresthesias are detected. SKIN: There are no ulcers or rashes noted. PSYCHIATRIC: The patient has a normal affect.  STUDIES:   I have reviewed her CT scan with the following findings: Aortic atherosclerosis.  Coronary artery calcifications are noted suggesting coronary  artery disease.  5 mm nodule seen in left lung apex, with another 6 mm nodule seen in left upper lobe. Initial follow-up with CT at 6-12 months  is recommended to confirm persistence. If persistent, repeat CT is recommended every 2 years until 5 years of stability has been established. This recommendation follows the consensus statement: Guidelines for Management of Incidental Pulmonary Nodules Detected on CT Images: From the Fleischner Society 2017; Radiology 2017; 284:228-243.  3.9 cm infrarenal abdominal aortic aneurysm is noted. Recommend followup by ultrasound in 2 years. This recommendation follows ACR consensus guidelines: White Paper of the ACR Incidental Findings Committee II on Vascular Findings. J Am Coll Radiol 2013; 10:789-794.  Calcified plaque noted at origin of superior mesenteric artery resulting in moderate focal stenosis. 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  Severe focal stenosis is noted at the origin of each renal artery secondary to calcified plaque.  Atherosclerotic calcifications are also noted in the iliac arteries, with moderate to severe focal stenosis noted in the proximal portion of each external iliac artery. Moderate to severe focal stenosis is also noted in the distal portion of the left external iliac artery.   MEDICAL ISSUES:   Lung nodule: I have ordered a CT scan for follow-up in 6 months  Claudication and abdominal aortic aneurysm: After lengthy discussion, we have decided to proceed with an aortobifemoral bypass graft.  This should address her claudication symptoms as well as treat her aneurysm.  I discussed risks and benefits of the operation including the risk of renal, GI, arterial complications.  We also discussed the risk of stroke and MI as well as bleeding and wound complications.  The patient wants to get this done.  I placed her on the schedule for Wednesday, August 29.  I will get cardiac clearance  from Dr. Wyline Mood.  All of her questions were answered today.    Durene Cal, MD [facility] and Vein Specialists of [facility] [phone] Pager [phone]

## 2017-01-24 NOTE — Telephone Encounter (Signed)
Informed patient that refills for Coreg, Lisinopril, & Simvastatin had already been sent to pharmacy for refill in July. Suggested she contact pharm to get ready for her.  Patient verbalized understanding.

## 2017-01-27 ENCOUNTER — Other Ambulatory Visit: Payer: Self-pay

## 2017-01-28 ENCOUNTER — Telehealth: Payer: Self-pay | Admitting: Cardiology

## 2017-01-28 NOTE — Telephone Encounter (Signed)
Pt was last seen 01/06/17 by Dr Wyline Mood - will forward

## 2017-01-28 NOTE — Telephone Encounter (Signed)
Z01.810 (ICD-10-CM) - Pre-operative cardiovascular examination  Per referral from [facility] request for URGENT appt to be made for patient.   I spoke with patient and she was under impression she did not need to be seen that Dr Wyline Mood would just give her clearance.   Please notify patient and doctor office if letter is just needed.

## 2017-01-31 NOTE — Telephone Encounter (Signed)
Im ok proceeding with surgery. Surgery puts a level of stress on the heart similar to walking up 1 flight of stairs or walking up to 2 blocks, if she tolerates these well there is no indication for additional testing prior to the procedure. Please forward my addended clinic note to her surgeon.  J Mirenda Baltazar MD

## 2017-01-31 NOTE — Telephone Encounter (Signed)
We had a recent appt but did not discuss this specifically at the time, appears surgery became the plan after our appt. Please ask patient if she has had any recent chest pain or sob since our last visit. What is the most physiucally demaniding this she does? Is she able to walk up 1 flight of stairs? Has she recently? Is she able to walk up to 2 blocks without stopping?    Dina Rich MD

## 2017-01-31 NOTE — Telephone Encounter (Signed)
Pt aware and voiced understanding - will forward this message to [facility]

## 2017-01-31 NOTE — Telephone Encounter (Signed)
Spoke with pt - denies any SOB/chest pain - says the most physical activity she does is vacuum and mop without stopping - says she is able to walk up a flight of stairs without problems/without stopping - says she able to walk 2 blocks and probably more without stopping [place]   Pt is scheduled with you on 8/27 in [place] does she need to keep this appt?

## 2017-02-01 NOTE — Addendum Note (Signed)
Addended by: Burton Apley A on: 02/01/2017 02:17 PM   Modules accepted: Orders

## 2017-02-07 ENCOUNTER — Ambulatory Visit: Payer: Medicare Other | Admitting: Cardiology

## 2017-02-07 ENCOUNTER — Encounter (HOSPITAL_COMMUNITY): Payer: Self-pay

## 2017-02-07 ENCOUNTER — Encounter (HOSPITAL_COMMUNITY)
Admission: RE | Admit: 2017-02-07 | Discharge: 2017-02-07 | Disposition: A | Discharge status: Home / Self Care | Payer: Medicare Other | Source: Ambulatory Visit | Attending: Surgery | Admitting: Surgery

## 2017-02-07 DIAGNOSIS — E119 Type 2 diabetes mellitus without complications: Secondary | ICD-10-CM

## 2017-02-07 DIAGNOSIS — Z01812 Encounter for preprocedural laboratory examination: Secondary | ICD-10-CM

## 2017-02-07 DIAGNOSIS — E785 Hyperlipidemia, unspecified: Secondary | ICD-10-CM | POA: Insufficient documentation

## 2017-02-07 DIAGNOSIS — I10 Essential (primary) hypertension: Secondary | ICD-10-CM | POA: Insufficient documentation

## 2017-02-07 DIAGNOSIS — I252 Old myocardial infarction: Secondary | ICD-10-CM

## 2017-02-07 DIAGNOSIS — Z8679 Personal history of other diseases of the circulatory system: Secondary | ICD-10-CM

## 2017-02-07 HISTORY — DX: Acute myocardial infarction, unspecified: I21.9

## 2017-02-07 HISTORY — DX: Inflammatory liver disease, unspecified: K75.9

## 2017-02-07 HISTORY — DX: Type 2 diabetes mellitus without complications: E11.9

## 2017-02-07 HISTORY — DX: Peripheral vascular disease, unspecified: I73.9

## 2017-02-07 HISTORY — DX: Heart failure, unspecified: I50.9

## 2017-02-07 HISTORY — DX: Ischemic cardiomyopathy: I25.5

## 2017-02-07 LAB — BLOOD GAS, ARTERIAL
Acid-base deficit: 1.7 mmol/L (ref 0.0–2.0)
Bicarbonate: 22.2 mmol/L (ref 20.0–28.0)
DRAWN BY: 449841
FIO2: 0.21
O2 Saturation: 94.9 %
PH ART: 7.413 (ref 7.350–7.450)
Patient temperature: 98.6
pCO2 arterial: 35.4 mmHg (ref 32.0–48.0)
pO2, Arterial: 72.3 mmHg — ABNORMAL LOW (ref 83.0–108.0)

## 2017-02-07 LAB — CBC
HEMATOCRIT: 39 % (ref 36.0–46.0)
HEMOGLOBIN: 12.6 g/dL (ref 12.0–15.0)
MCH: 28.9 pg (ref 26.0–34.0)
MCHC: 32.3 g/dL (ref 30.0–36.0)
MCV: 89.4 fL (ref 78.0–100.0)
Platelets: 240 10*3/uL (ref 150–400)
RBC: 4.36 MIL/uL (ref 3.87–5.11)
RDW: 13.5 % (ref 11.5–15.5)
WBC: 9.9 10*3/uL (ref 4.0–10.5)

## 2017-02-07 LAB — COMPREHENSIVE METABOLIC PANEL
ALBUMIN: 4 g/dL (ref 3.5–5.0)
ALK PHOS: 57 U/L (ref 38–126)
ALT: 13 U/L — ABNORMAL LOW (ref 14–54)
AST: 21 U/L (ref 15–41)
Anion gap: 9 (ref 5–15)
BILIRUBIN TOTAL: 0.8 mg/dL (ref 0.3–1.2)
BUN: 20 mg/dL (ref 6–20)
CO2: 20 mmol/L — ABNORMAL LOW (ref 22–32)
Calcium: 9.2 mg/dL (ref 8.9–10.3)
Chloride: 109 mmol/L (ref 101–111)
Creatinine, Ser: 0.96 mg/dL (ref 0.44–1.00)
GFR calc Af Amer: >60 mL/min (ref >60)
GFR calc non Af Amer: 57 mL/min — ABNORMAL LOW (ref >60)
GLUCOSE: 106 mg/dL — AB (ref 65–99)
POTASSIUM: 4.7 mmol/L (ref 3.5–5.1)
Sodium: 138 mmol/L (ref 135–145)
TOTAL PROTEIN: 7 g/dL (ref 6.5–8.1)

## 2017-02-07 LAB — URINALYSIS, ROUTINE W REFLEX MICROSCOPIC
BILIRUBIN URINE: NEGATIVE
Glucose, UA: NEGATIVE mg/dL
HGB URINE DIPSTICK: NEGATIVE
KETONES UR: NEGATIVE mg/dL
Leukocytes, UA: NEGATIVE
Nitrite: NEGATIVE
PROTEIN: NEGATIVE mg/dL
Specific Gravity, Urine: 1.008 (ref 1.005–1.030)
pH: 5 (ref 5.0–8.0)

## 2017-02-07 LAB — PREPARE RBC (CROSSMATCH)

## 2017-02-07 LAB — SURGICAL PCR SCREEN
MRSA, PCR: NEGATIVE
Staphylococcus aureus: POSITIVE — AB

## 2017-02-07 LAB — PROTIME-INR
INR: 0.97
Prothrombin Time: 12.9 seconds (ref 11.4–15.2)

## 2017-02-07 LAB — ABO/RH: ABO/RH(D): A NEG

## 2017-02-07 LAB — APTT: aPTT: 23 seconds — ABNORMAL LOW (ref 24–36)

## 2017-02-07 LAB — GLUCOSE, CAPILLARY: Glucose-Capillary: 100 mg/dL — ABNORMAL HIGH (ref 65–99)

## 2017-02-07 NOTE — Progress Notes (Signed)
PCP: Dr. Kandra Nicolas 2 [facility] in [place], [place]. --request hgb A1C  Cardiologist :Dr. Wyline Mood  Fasting sugars 77-110

## 2017-02-07 NOTE — Pre-Procedure Instructions (Signed)
[facility]  02/07/2017      [facility] - [place], [place] - [address] [place] [address] Phone: [phone] Fax: [phone]  [facility] [address], [place] - [address] [place] [address] Phone: [phone] Fax: [phone]  [facility] [address] [place] - [address] [place] [address] Phone: [phone] Fax: [phone]    Your procedure is scheduled on Wed. Aug. 29  Report to [facility] Admitting at 5:30 A.M.  Call this number if you have problems the morning of surgery:  [phone]   Remember:  Do not eat food or drink liquids after midnight on Tues. Aug. 28   Take these medicines the morning of surgery with A SIP OF WATER : carvedilol (coreg), nitroglycerine if needed, pregabalin (lyrica), tramdol if needed            Stop advil, motrin, ibuprofen, aleve, BC Powders, Goody's, vitamins and herbal medicines : omega 3    How to Manage Your Diabetes Before and After Surgery  Why is it important to control my blood sugar before and after surgery? . Improving blood sugar levels before and after surgery helps healing and can limit problems. . A way of improving blood sugar control is eating a healthy diet by: o  Eating less sugar and carbohydrates o  Increasing activity/exercise o  Talking with your doctor about reaching your blood sugar goals . High blood sugars (greater than 180 mg/dL) can raise your risk of infections and slow your recovery, so you will need to focus on controlling your diabetes during the weeks before surgery. . Make sure that the doctor who takes care of your diabetes knows about your planned surgery including the date and location.  How do I manage my blood sugar before surgery? . Check your blood sugar at least 4 times a day, starting 2 days before surgery, to make sure that the level is not too  high or low. o Check your blood sugar the morning of your surgery when you wake up and every 2 hours until you get to the Short Stay unit. . If your blood sugar is less than 70 mg/dL, you will need to treat for low blood sugar: o Do not take insulin. o Treat a low blood sugar (less than 70 mg/dL) with  cup of clear juice (cranberry or apple), 4 glucose tablets, OR glucose gel. o Recheck blood sugar in 15 minutes after treatment (to make sure it is greater than 70 mg/dL). If your blood sugar is not greater than 70 mg/dL on recheck, call [phone] for further instructions. . Report your blood sugar to the short stay nurse when you get to Short Stay.  . If you are admitted to the hospital after surgery: o Your blood sugar will be checked by the staff and you will probably be given insulin after surgery (instead of oral diabetes medicines) to make sure you have good blood sugar levels. o The goal for blood sugar control after surgery is 80-180 mg/dL.       WHAT DO I DO ABOUT MY DIABETES MEDICATION?   Marland Kitchen Do not take oral diabetes medicines (pills) the morning of surgery.    Do not wear jewelry, make-up or nail polish.  Do not wear lotions, powders, or perfumes, or deoderant.  Do not shave 48 hours prior to surgery.  Men may shave face and  neck.  Do not bring valuables to the hospital.  [facility] is not responsible for any belongings or valuables.  Contacts, dentures or bridgework may not be worn into surgery.  Leave your suitcase in the car.  After surgery it may be brought to your room.  For patients admitted to the hospital, discharge time will be determined by your treatment team.  Patients discharged the day of surgery will not be allowed to drive home.   Special instructions:   [facility]- Preparing For Surgery  Before surgery, you can play an important role. Because skin is not sterile, your skin needs to be as free of germs as possible. You can reduce the number of germs  on your skin by washing with CHG (chlorahexidine gluconate) Soap before surgery.  CHG is an antiseptic cleaner which kills germs and bonds with the skin to continue killing germs even after washing.  Please do not use if you have an allergy to CHG or antibacterial soaps. If your skin becomes reddened/irritated stop using the CHG.  Do not shave (including legs and underarms) for at least 48 hours prior to first CHG shower. It is OK to shave your face.  Please follow these instructions carefully.   1. Shower the NIGHT BEFORE SURGERY and the MORNING OF SURGERY with CHG.   2. If you chose to wash your hair, wash your hair first as usual with your normal shampoo.  3. After you shampoo, rinse your hair and body thoroughly to remove the shampoo.  4. Use CHG as you would any other liquid soap. You can apply CHG directly to the skin and wash gently with a scrungie or a clean washcloth.   5. Apply the CHG Soap to your body ONLY FROM THE NECK DOWN.  Do not use on open wounds or open sores. Avoid contact with your eyes, ears, mouth and genitals (private parts). Wash genitals (private parts) with your normal soap.  6. Wash thoroughly, paying special attention to the area where your surgery will be performed.  7. Thoroughly rinse your body with warm water from the neck down.  8. DO NOT shower/wash with your normal soap after using and rinsing off the CHG Soap.  9. Pat yourself dry with a CLEAN TOWEL.   10. Wear CLEAN PAJAMAS   11. Place CLEAN SHEETS on your bed the night of your first shower and DO NOT SLEEP WITH PETS.    Day of Surgery: Do not apply any deodorants/lotions. Please wear clean clothes to the hospital/surgery center.      Please read over the following fact sheets that you were given. Coughing and Deep Breathing, MRSA Information and Surgical Site Infection Prevention

## 2017-02-08 ENCOUNTER — Encounter (HOSPITAL_COMMUNITY): Payer: Self-pay

## 2017-02-08 NOTE — Anesthesia Preprocedure Evaluation (Addendum)
Anesthesia Evaluation  Patient identified by MRN, date of birth, ID band Patient awake    Reviewed: Allergy & Precautions, NPO status , Patient's Chart, lab work & pertinent test results  Airway Mallampati: II  TM Distance: >3 FB Neck ROM: Full    Dental  (+) Edentulous Upper, Edentulous Lower   Pulmonary Current Smoker,    Pulmonary exam normal breath sounds clear to auscultation       Cardiovascular hypertension, + CAD, + Past MI, + Cardiac Stents, + Peripheral Vascular Disease and +CHF  Normal cardiovascular exam Rhythm:Regular Rate:Normal  TTE 01/2016 - - Left ventricle: Mild concentric hypertrophy. Estimated ejection fraction was in the range of 35% to 40%. Grade 1 diastolic dysfunction. - Right ventricle: Systolic function was mildly reduced. - Tricuspid valve: There was mild regurgitation. - Pulmonary arteries: Systolic pressure was within the normal  Range.  Carotid US 10/2016 - b/l 40-59% ICAS   Neuro/Psych negative neurological ROS  negative psych ROS   GI/Hepatic negative GI ROS, Hx Hepatitis A   Endo/Other  diabetes  Renal/GU negative Renal ROS  negative genitourinary   Musculoskeletal negative musculoskeletal ROS (+)   Abdominal   Peds  Hematology negative hematology ROS (+)   Anesthesia Other Findings   Reproductive/Obstetrics                           Anesthesia Physical Anesthesia Plan  ASA: IV  Anesthesia Plan: General   Post-op Pain Management:    Induction: Intravenous  PONV Risk Score and Plan: 3 and Ondansetron, Dexamethasone, Propofol infusion and Treatment may vary due to age or medical condition  Airway Management Planned: Oral ETT  Additional Equipment: Arterial line, Ultrasound Guidance Line Placement and CVP  Intra-op Plan:   Post-operative Plan: Extubation in OR  Informed Consent: I have reviewed the patients History and Physical, chart, labs and  discussed the procedure including the risks, benefits and alternatives for the proposed anesthesia with the patient or authorized representative who has indicated his/her understanding and acceptance.   Dental advisory given  Plan Discussed with: CRNA  Anesthesia Plan Comments:       Anesthesia Quick Evaluation

## 2017-02-08 NOTE — Progress Notes (Signed)
I called a prescription for Mupirocin ointment to [place] , [place]

## 2017-02-08 NOTE — Progress Notes (Signed)
Anesthesia chart review:  Patient is a 71 year old female scheduled for aorta bifemoral bypass graft on 02/09/2017 with Coral Else, M.D.  - PCP is Kandra Nicolas, MD in [place], [place] - Cardiologist is Dina Rich, MD who cleared pt for surgery. Last office visit 01/06/17  PMH includes: CAD (stent to RCA 2001; BMS to ostial LAD, total occlusion RCA stent 2009) ischemic cardiomyopathy, CHF, atrial septal defect, HTN,, DM hyperlipidemia, hepatitis A (1971), non-Hodgkin's lymphoma. Current smoker. BMI 27. Next  Medications include: ASA 81 mg, carvedilol, lisinopril, metformin, simvastatin  BP (!) 143/57   Pulse (!) 51   Temp 36.8 C   Resp 18   Ht 4' 11.5" (1.511 m)   Wt 136 lb 9.6 oz (62 kg)   SpO2 99%   BMI 27.13 kg/m   Preoperative labs reviewed.  Glucose 106. HbA1c pending from PCP's office.   CT angio chest, abd, pelvis 01/24/17:  - Aortic atherosclerosis. - Coronary artery calcifications are noted suggesting CAD. - 5 mm nodule seen in left lung apex, with another 6 mm nodule seen in left upper lobe. Initial follow-up with CT at 6-12 months is recommended to confirm persistence. - 3.9 cm infrarenal abdominal aortic aneurysm is noted. Recommend followup by ultrasound in 2 years.  - Calcified plaque noted at origin of superior mesenteric artery resulting in moderate focal stenosis. 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 - Severe focal stenosis is noted at the origin of each renal artery secondary to calcified plaque. - Atherosclerotic calcifications are also noted in the iliac arteries, with moderate to severe focal stenosis noted in the proximal portion of each external iliac artery. Moderate to severe focal stenosis is also noted in the distal portion of the left external iliac artery.  EKG 01/11/17:  - Sinus bradycardia (50 bpm) with sinus arrhythmia with occasional PVCs. Inferior infarct, age undetermined. Nonspecific ST and T wave  abnormality  Abdominal aortogram 01/11/17:  1. occluded left distal external iliac artery 2. Aneurysm of renal abdominal aorta 3. significant stenosis in the distal right common femoral artery 4. two-vessel runoff bilaterally via the peroneal and posterior tibial artery  Carotid duplex 10/21/16:  - Stable 40-59% bilateral ICA stenosis, high end of range. - >50% LECA stenosis. - Patent vertebral arteries with antegrade flow. - Elevated subclavian artery velocities, bilaterally. - History of right brachial artery embolectomy, by patient description  Echo 01/19/16:  - Left ventricle: The cavity size was mildly dilated. There was mild concentric hypertrophy. Systolic function was moderately reduced. The estimated ejection fraction was in the range of 35% to 40%. Diffuse hypokinesis. Doppler parameters are consistent with abnormal left ventricular relaxation (grade 1 diastolic dysfunction). Doppler parameters are consistent with high ventricular filling pressure. - Aortic valve: Transvalvular velocity was within the normal range. There was no stenosis. There was no regurgitation. - Mitral valve: Transvalvular velocity was within the normal range. There was no evidence for stenosis. There was trivial regurgitation. - Left atrium: The atrium was mildly dilated. - Right ventricle: The cavity size was normal. Wall thickness was normal. Systolic function was mildly reduced. - Tricuspid valve: There was mild regurgitation. - Pulmonary arteries: Systolic pressure was within the normal range. PA peak pressure: 32 mm Hg (S).  Cardiac cath 07/14/07:  1. CAD with recent non-STEMI with 90% stenosis in the proximal LAD near the ostium, 40% proximal to mid and 30% mid-stenosis in the LAD with 40% narrowing in the D2, 80% narrowing in posterolateral branch of CX, and total  occlusion of mid-RCA with inferior wall hypokinesis and estimated fraction of 35% to 40%.  2. Successful IVUS-guided stenting of the lesion in  the ostium of the LAD using a Vision BMS with improvement in narrowing from 90% to 0%.  If no changes, I anticipate pt can proceed with surgery as scheduled.   Rica Mast, FNP-BC [facility]/Anesthesiology Phone: [phone] 02/08/2017 10:35 AM

## 2017-02-09 ENCOUNTER — Inpatient Hospital Stay (HOSPITAL_COMMUNITY): Payer: Medicare Other | Admitting: Emergency Medicine

## 2017-02-09 ENCOUNTER — Encounter (HOSPITAL_COMMUNITY)
Admission: RE | Disposition: A | Discharge status: Skilled Nursing Facility | Payer: Self-pay | Source: Ambulatory Visit | Attending: Surgery

## 2017-02-09 ENCOUNTER — Inpatient Hospital Stay (HOSPITAL_COMMUNITY)
Admission: RE | Admit: 2017-02-09 | Discharge: 2017-02-19 | DRG: 270 | Disposition: A | Discharge status: Skilled Nursing Facility | Payer: Medicare Other | Source: Ambulatory Visit | Attending: Surgery | Admitting: Surgery

## 2017-02-09 ENCOUNTER — Inpatient Hospital Stay (HOSPITAL_COMMUNITY): Payer: Medicare Other | Admitting: Anesthesiology

## 2017-02-09 ENCOUNTER — Inpatient Hospital Stay (HOSPITAL_COMMUNITY): Payer: Medicare Other

## 2017-02-09 ENCOUNTER — Encounter (HOSPITAL_COMMUNITY): Payer: Self-pay | Admitting: *Deleted

## 2017-02-09 DIAGNOSIS — Z8541 Personal history of malignant neoplasm of cervix uteri: Secondary | ICD-10-CM

## 2017-02-09 DIAGNOSIS — Z8572 Personal history of non-Hodgkin lymphomas: Secondary | ICD-10-CM

## 2017-02-09 DIAGNOSIS — Z806 Family history of leukemia: Secondary | ICD-10-CM | POA: Diagnosis not present

## 2017-02-09 DIAGNOSIS — N179 Acute kidney failure, unspecified: Secondary | ICD-10-CM | POA: Diagnosis not present

## 2017-02-09 DIAGNOSIS — R0602 Shortness of breath: Secondary | ICD-10-CM

## 2017-02-09 DIAGNOSIS — R570 Cardiogenic shock: Secondary | ICD-10-CM | POA: Diagnosis not present

## 2017-02-09 DIAGNOSIS — I5023 Acute on chronic systolic (congestive) heart failure: Secondary | ICD-10-CM

## 2017-02-09 DIAGNOSIS — I252 Old myocardial infarction: Secondary | ICD-10-CM | POA: Diagnosis not present

## 2017-02-09 DIAGNOSIS — I739 Peripheral vascular disease, unspecified: Secondary | ICD-10-CM | POA: Diagnosis not present

## 2017-02-09 DIAGNOSIS — E7800 Pure hypercholesterolemia, unspecified: Secondary | ICD-10-CM | POA: Diagnosis not present

## 2017-02-09 DIAGNOSIS — D62 Acute posthemorrhagic anemia: Secondary | ICD-10-CM | POA: Diagnosis not present

## 2017-02-09 DIAGNOSIS — Z95828 Presence of other vascular implants and grafts: Secondary | ICD-10-CM

## 2017-02-09 DIAGNOSIS — I255 Ischemic cardiomyopathy: Secondary | ICD-10-CM | POA: Diagnosis present

## 2017-02-09 DIAGNOSIS — E1151 Type 2 diabetes mellitus with diabetic peripheral angiopathy without gangrene: Principal | ICD-10-CM | POA: Diagnosis present

## 2017-02-09 DIAGNOSIS — I700 Atherosclerosis of aorta: Secondary | ICD-10-CM | POA: Diagnosis present

## 2017-02-09 DIAGNOSIS — E785 Hyperlipidemia, unspecified: Secondary | ICD-10-CM | POA: Diagnosis present

## 2017-02-09 DIAGNOSIS — I110 Hypertensive heart disease with heart failure: Secondary | ICD-10-CM | POA: Diagnosis present

## 2017-02-09 DIAGNOSIS — E8770 Fluid overload, unspecified: Secondary | ICD-10-CM

## 2017-02-09 DIAGNOSIS — Z955 Presence of coronary angioplasty implant and graft: Secondary | ICD-10-CM

## 2017-02-09 DIAGNOSIS — E875 Hyperkalemia: Secondary | ICD-10-CM | POA: Diagnosis not present

## 2017-02-09 DIAGNOSIS — Z7984 Long term (current) use of oral hypoglycemic drugs: Secondary | ICD-10-CM

## 2017-02-09 DIAGNOSIS — I4892 Unspecified atrial flutter: Secondary | ICD-10-CM | POA: Diagnosis not present

## 2017-02-09 DIAGNOSIS — J9600 Acute respiratory failure, unspecified whether with hypoxia or hypercapnia: Secondary | ICD-10-CM | POA: Diagnosis not present

## 2017-02-09 DIAGNOSIS — I2510 Atherosclerotic heart disease of native coronary artery without angina pectoris: Secondary | ICD-10-CM | POA: Diagnosis present

## 2017-02-09 DIAGNOSIS — I70213 Atherosclerosis of native arteries of extremities with intermittent claudication, bilateral legs: Secondary | ICD-10-CM | POA: Diagnosis present

## 2017-02-09 DIAGNOSIS — M79605 Pain in left leg: Secondary | ICD-10-CM | POA: Diagnosis present

## 2017-02-09 DIAGNOSIS — Z9221 Personal history of antineoplastic chemotherapy: Secondary | ICD-10-CM

## 2017-02-09 DIAGNOSIS — D696 Thrombocytopenia, unspecified: Secondary | ICD-10-CM | POA: Diagnosis present

## 2017-02-09 DIAGNOSIS — F17210 Nicotine dependence, cigarettes, uncomplicated: Secondary | ICD-10-CM | POA: Diagnosis present

## 2017-02-09 DIAGNOSIS — Z885 Allergy status to narcotic agent status: Secondary | ICD-10-CM | POA: Diagnosis not present

## 2017-02-09 DIAGNOSIS — I480 Paroxysmal atrial fibrillation: Secondary | ICD-10-CM

## 2017-02-09 DIAGNOSIS — I5021 Acute systolic (congestive) heart failure: Secondary | ICD-10-CM | POA: Diagnosis not present

## 2017-02-09 DIAGNOSIS — Z7982 Long term (current) use of aspirin: Secondary | ICD-10-CM

## 2017-02-09 DIAGNOSIS — I340 Nonrheumatic mitral (valve) insufficiency: Secondary | ICD-10-CM | POA: Diagnosis not present

## 2017-02-09 DIAGNOSIS — I714 Abdominal aortic aneurysm, without rupture, unspecified: Secondary | ICD-10-CM | POA: Diagnosis present

## 2017-02-09 DIAGNOSIS — Z8249 Family history of ischemic heart disease and other diseases of the circulatory system: Secondary | ICD-10-CM

## 2017-02-09 DIAGNOSIS — R911 Solitary pulmonary nodule: Secondary | ICD-10-CM | POA: Diagnosis present

## 2017-02-09 DIAGNOSIS — I361 Nonrheumatic tricuspid (valve) insufficiency: Secondary | ICD-10-CM | POA: Diagnosis not present

## 2017-02-09 DIAGNOSIS — R0902 Hypoxemia: Secondary | ICD-10-CM

## 2017-02-09 HISTORY — PX: AORTA - BILATERAL FEMORAL ARTERY BYPASS GRAFT: SHX1175

## 2017-02-09 HISTORY — PX: ANGIOPLASTY: SHX39

## 2017-02-09 LAB — BLOOD GAS, ARTERIAL
Acid-base deficit: 4.5 mmol/L — ABNORMAL HIGH (ref 0.0–2.0)
BICARBONATE: 20.9 mmol/L (ref 20.0–28.0)
Drawn by: 511841
O2 CONTENT: 8 L/min
O2 SAT: 97 %
PATIENT TEMPERATURE: 98.6
pCO2 arterial: 44.3 mmHg (ref 32.0–48.0)
pH, Arterial: 7.295 — ABNORMAL LOW (ref 7.350–7.450)
pO2, Arterial: 104 mmHg (ref 83.0–108.0)

## 2017-02-09 LAB — BASIC METABOLIC PANEL
ANION GAP: 7 (ref 5–15)
BUN: 18 mg/dL (ref 6–20)
CO2: 21 mmol/L — ABNORMAL LOW (ref 22–32)
Calcium: 7.7 mg/dL — ABNORMAL LOW (ref 8.9–10.3)
Chloride: 110 mmol/L (ref 101–111)
Creatinine, Ser: 1.07 mg/dL — ABNORMAL HIGH (ref 0.44–1.00)
GFR, EST AFRICAN AMERICAN: 58 mL/min — AB (ref >60)
GFR, EST NON AFRICAN AMERICAN: 50 mL/min — AB (ref >60)
Glucose, Bld: 182 mg/dL — ABNORMAL HIGH (ref 65–99)
POTASSIUM: 4.8 mmol/L (ref 3.5–5.1)
SODIUM: 138 mmol/L (ref 135–145)

## 2017-02-09 LAB — GLUCOSE, CAPILLARY
GLUCOSE-CAPILLARY: 93 mg/dL (ref 65–99)
GLUCOSE-CAPILLARY: 174 mg/dL — AB (ref 65–99)

## 2017-02-09 LAB — APTT: APTT: 27 s (ref 24–36)

## 2017-02-09 LAB — CBC
HCT: 31.9 % — ABNORMAL LOW (ref 36.0–46.0)
Hemoglobin: 10.4 g/dL — ABNORMAL LOW (ref 12.0–15.0)
MCH: 29.3 pg (ref 26.0–34.0)
MCHC: 32.6 g/dL (ref 30.0–36.0)
MCV: 89.9 fL (ref 78.0–100.0)
PLATELETS: 157 10*3/uL (ref 150–400)
RBC: 3.55 MIL/uL — AB (ref 3.87–5.11)
RDW: 13.4 % (ref 11.5–15.5)
WBC: 19.1 10*3/uL — AB (ref 4.0–10.5)

## 2017-02-09 LAB — PROTIME-INR
INR: 1.32
PROTHROMBIN TIME: 16.3 s — AB (ref 11.4–15.2)

## 2017-02-09 LAB — MAGNESIUM: Magnesium: 1.4 mg/dL — ABNORMAL LOW (ref 1.7–2.4)

## 2017-02-09 SURGERY — CREATION, BYPASS, ARTERIAL, AORTA TO FEMORAL, BILATERAL, USING GRAFT
Anesthesia: General | Site: Groin

## 2017-02-09 MED ORDER — METOPROLOL TARTRATE 5 MG/5ML IV SOLN
2.5000 mg | Freq: Four times a day (QID) | INTRAVENOUS | Status: DC
  Administered 2017-02-09 – 2017-02-11 (×9): 2.5 mg via INTRAVENOUS
  Filled 2017-02-09 (×8): qty 5

## 2017-02-09 MED ORDER — PROPOFOL 10 MG/ML IV BOLUS
INTRAVENOUS | Status: DC | PRN
  Administered 2017-02-09: 100 mg via INTRAVENOUS

## 2017-02-09 MED ORDER — PANTOPRAZOLE SODIUM 40 MG PO TBEC
40.0000 mg | DELAYED_RELEASE_TABLET | Freq: Every day | ORAL | Status: DC
  Administered 2017-02-13 – 2017-02-19 (×7): 40 mg via ORAL
  Filled 2017-02-09 (×8): qty 1

## 2017-02-09 MED ORDER — GLYCOPYRROLATE 0.2 MG/ML IJ SOLN
INTRAMUSCULAR | Status: DC | PRN
  Administered 2017-02-09 (×3): 0.2 mg via INTRAVENOUS

## 2017-02-09 MED ORDER — ONDANSETRON HCL 4 MG/2ML IJ SOLN: 4.0000 mg | Freq: Once | INTRAMUSCULAR | Status: DC | PRN

## 2017-02-09 MED ORDER — EPHEDRINE SULFATE 50 MG/ML IJ SOLN
INTRAMUSCULAR | Status: DC | PRN
  Administered 2017-02-09 (×2): 5 mg via INTRAVENOUS

## 2017-02-09 MED ORDER — MAGNESIUM SULFATE 2 GM/50ML IV SOLN
2.0000 g | Freq: Once | INTRAVENOUS | Status: AC | PRN
  Administered 2017-02-09: 2 g via INTRAVENOUS
  Filled 2017-02-09: qty 50

## 2017-02-09 MED ORDER — ONDANSETRON HCL 4 MG/2ML IJ SOLN
INTRAMUSCULAR | Status: AC
  Filled 2017-02-09: qty 2

## 2017-02-09 MED ORDER — INSULIN ASPART 100 UNIT/ML ~~LOC~~ SOLN: 0.0000 [IU] | Freq: Three times a day (TID) | SUBCUTANEOUS | Status: DC

## 2017-02-09 MED ORDER — PANTOPRAZOLE SODIUM 40 MG IV SOLR
40.0000 mg | Freq: Every day | INTRAVENOUS | Status: DC
  Administered 2017-02-09 – 2017-02-12 (×4): 40 mg via INTRAVENOUS
  Filled 2017-02-09 (×5): qty 40

## 2017-02-09 MED ORDER — PROPOFOL 10 MG/ML IV BOLUS
INTRAVENOUS | Status: AC
  Filled 2017-02-09: qty 20

## 2017-02-09 MED ORDER — LACTATED RINGERS IV SOLN
INTRAVENOUS | Status: DC | PRN
  Administered 2017-02-09 (×2): via INTRAVENOUS

## 2017-02-09 MED ORDER — PROTAMINE SULFATE 10 MG/ML IV SOLN
INTRAVENOUS | Status: DC | PRN
  Administered 2017-02-09: 50 mg via INTRAVENOUS

## 2017-02-09 MED ORDER — PHENYLEPHRINE HCL 10 MG/ML IJ SOLN
INTRAVENOUS | Status: DC | PRN
  Administered 2017-02-09: 20 ug/min via INTRAVENOUS

## 2017-02-09 MED ORDER — DEXTROSE-NACL 5-0.45 % IV SOLN
INTRAVENOUS | Status: DC
  Administered 2017-02-09 – 2017-02-10 (×2): via INTRAVENOUS
  Administered 2017-02-10: 1000 mL via INTRAVENOUS
  Administered 2017-02-11 (×2): via INTRAVENOUS

## 2017-02-09 MED ORDER — HEPARIN SODIUM (PORCINE) 1000 UNIT/ML IJ SOLN
INTRAMUSCULAR | Status: AC
  Filled 2017-02-09: qty 3

## 2017-02-09 MED ORDER — METOPROLOL TARTRATE 5 MG/5ML IV SOLN
2.0000 mg | INTRAVENOUS | Status: DC | PRN
  Administered 2017-02-11: 2.5 mg via INTRAVENOUS
  Filled 2017-02-09 (×2): qty 5

## 2017-02-09 MED ORDER — BISACODYL 10 MG RE SUPP
10.0000 mg | Freq: Every day | RECTAL | Status: DC | PRN
  Filled 2017-02-09: qty 1

## 2017-02-09 MED ORDER — ONDANSETRON HCL 4 MG/2ML IJ SOLN
4.0000 mg | Freq: Four times a day (QID) | INTRAMUSCULAR | Status: DC | PRN
  Filled 2017-02-09: qty 2

## 2017-02-09 MED ORDER — FENTANYL CITRATE (PF) 250 MCG/5ML IJ SOLN
INTRAMUSCULAR | Status: AC
  Filled 2017-02-09: qty 5

## 2017-02-09 MED ORDER — GUAIFENESIN-DM 100-10 MG/5ML PO SYRP: 15.0000 mL | ORAL_SOLUTION | ORAL | Status: DC | PRN

## 2017-02-09 MED ORDER — MIDAZOLAM HCL 2 MG/2ML IJ SOLN
INTRAMUSCULAR | Status: AC
  Filled 2017-02-09: qty 2

## 2017-02-09 MED ORDER — SODIUM CHLORIDE 0.9 % IJ SOLN
INTRAMUSCULAR | Status: AC
  Filled 2017-02-09: qty 10

## 2017-02-09 MED ORDER — HEMOSTATIC AGENTS (NO CHARGE) OPTIME
TOPICAL | Status: DC | PRN
  Administered 2017-02-09 (×2): 1 via TOPICAL

## 2017-02-09 MED ORDER — CHLORHEXIDINE GLUCONATE 4 % EX LIQD: 60.0000 mL | Freq: Once | CUTANEOUS | Status: DC

## 2017-02-09 MED ORDER — ALBUMIN HUMAN 5 % IV SOLN
INTRAVENOUS | Status: DC | PRN
  Administered 2017-02-09: 12:00:00 via INTRAVENOUS

## 2017-02-09 MED ORDER — SODIUM CHLORIDE 0.9 % IV SOLN
INTRAVENOUS | Status: DC | PRN
  Administered 2017-02-09: 08:00:00 500 mL

## 2017-02-09 MED ORDER — PHENYLEPHRINE HCL 10 MG/ML IJ SOLN
INTRAMUSCULAR | Status: DC | PRN
  Administered 2017-02-09: 120 ug via INTRAVENOUS
  Administered 2017-02-09: 80 ug via INTRAVENOUS

## 2017-02-09 MED ORDER — FENTANYL CITRATE (PF) 100 MCG/2ML IJ SOLN
25.0000 ug | INTRAMUSCULAR | Status: DC | PRN
  Administered 2017-02-09 (×4): 50 ug via INTRAVENOUS

## 2017-02-09 MED ORDER — DEXTROSE 5 % IV SOLN
1.5000 g | INTRAVENOUS | Status: DC
  Filled 2017-02-09: qty 1.5

## 2017-02-09 MED ORDER — FENTANYL CITRATE (PF) 100 MCG/2ML IJ SOLN
INTRAMUSCULAR | Status: AC
  Administered 2017-02-09: 50 ug via INTRAVENOUS
  Filled 2017-02-09: qty 2

## 2017-02-09 MED ORDER — FENTANYL CITRATE (PF) 100 MCG/2ML IJ SOLN
INTRAMUSCULAR | Status: DC | PRN
  Administered 2017-02-09 (×5): 50 ug via INTRAVENOUS
  Administered 2017-02-09: 25 ug via INTRAVENOUS
  Administered 2017-02-09 (×2): 50 ug via INTRAVENOUS

## 2017-02-09 MED ORDER — CEFUROXIME SODIUM 1.5 G IV SOLR
1.5000 g | INTRAVENOUS | Status: AC
  Administered 2017-02-09 (×2): 1.5 g via INTRAVENOUS
  Filled 2017-02-09: qty 1.5

## 2017-02-09 MED ORDER — ORAL CARE MOUTH RINSE
15.0000 mL | Freq: Two times a day (BID) | OROMUCOSAL | Status: DC
  Administered 2017-02-09: 15 mL via OROMUCOSAL

## 2017-02-09 MED ORDER — SODIUM CHLORIDE 0.9 % IV SOLN
500.0000 mL | Freq: Once | INTRAVENOUS | Status: AC | PRN
  Administered 2017-02-09: 500 mL via INTRAVENOUS

## 2017-02-09 MED ORDER — LABETALOL HCL 5 MG/ML IV SOLN: 10.0000 mg | INTRAVENOUS | Status: DC | PRN

## 2017-02-09 MED ORDER — ROCURONIUM BROMIDE 100 MG/10ML IV SOLN
INTRAVENOUS | Status: DC | PRN
  Administered 2017-02-09: 20 mg via INTRAVENOUS
  Administered 2017-02-09: 40 mg via INTRAVENOUS
  Administered 2017-02-09 (×7): 20 mg via INTRAVENOUS

## 2017-02-09 MED ORDER — ONDANSETRON HCL 4 MG/2ML IJ SOLN
INTRAMUSCULAR | Status: DC | PRN
  Administered 2017-02-09: 4 mg via INTRAVENOUS

## 2017-02-09 MED ORDER — HYDRALAZINE HCL 20 MG/ML IJ SOLN: 5.0000 mg | INTRAMUSCULAR | Status: DC | PRN

## 2017-02-09 MED ORDER — SODIUM CHLORIDE 0.9 % IV SOLN: INTRAVENOUS | Status: DC

## 2017-02-09 MED ORDER — PHENOL 1.4 % MT LIQD
1.0000 | OROMUCOSAL | Status: DC | PRN
  Administered 2017-02-09: 1 via OROMUCOSAL
  Filled 2017-02-09: qty 177

## 2017-02-09 MED ORDER — ACETAMINOPHEN 325 MG RE SUPP
325.0000 mg | RECTAL | Status: DC | PRN
Start: 2017-02-09 — End: 2017-02-19
  Filled 2017-02-09: qty 2

## 2017-02-09 MED ORDER — SUGAMMADEX SODIUM 200 MG/2ML IV SOLN
INTRAVENOUS | Status: DC | PRN
  Administered 2017-02-09: 150 mg via INTRAVENOUS

## 2017-02-09 MED ORDER — ACETAMINOPHEN 325 MG PO TABS
325.0000 mg | ORAL_TABLET | ORAL | Status: DC | PRN
  Administered 2017-02-13: 650 mg via ORAL
  Administered 2017-02-14: 325 mg via ORAL
  Administered 2017-02-15: 650 mg via ORAL
  Filled 2017-02-09: qty 2
  Filled 2017-02-09: qty 1
  Filled 2017-02-09: qty 2

## 2017-02-09 MED ORDER — ARTIFICIAL TEARS OPHTHALMIC OINT
TOPICAL_OINTMENT | OPHTHALMIC | Status: AC
  Filled 2017-02-09: qty 3.5

## 2017-02-09 MED ORDER — 0.9 % SODIUM CHLORIDE (POUR BTL) OPTIME
TOPICAL | Status: DC | PRN
  Administered 2017-02-09: 3000 mL
  Administered 2017-02-09 (×2): 1000 mL

## 2017-02-09 MED ORDER — POTASSIUM CHLORIDE CRYS ER 20 MEQ PO TBCR
20.0000 meq | EXTENDED_RELEASE_TABLET | Freq: Once | ORAL | Status: AC | PRN
  Administered 2017-02-17: 20 meq via ORAL
  Filled 2017-02-09: qty 1

## 2017-02-09 MED ORDER — LACTATED RINGERS IV SOLN
INTRAVENOUS | Status: DC | PRN
  Administered 2017-02-09 (×2): via INTRAVENOUS

## 2017-02-09 MED ORDER — PHENYLEPHRINE 40 MCG/ML (10ML) SYRINGE FOR IV PUSH (FOR BLOOD PRESSURE SUPPORT)
PREFILLED_SYRINGE | INTRAVENOUS | Status: AC
  Filled 2017-02-09: qty 10

## 2017-02-09 MED ORDER — DEXTROSE 5 % IV SOLN
1.5000 g | Freq: Two times a day (BID) | INTRAVENOUS | Status: AC
  Administered 2017-02-10 (×2): 1.5 g via INTRAVENOUS
  Filled 2017-02-09 (×2): qty 1.5

## 2017-02-09 MED ORDER — HEPARIN SODIUM (PORCINE) 1000 UNIT/ML IJ SOLN
INTRAMUSCULAR | Status: DC | PRN
  Administered 2017-02-09 (×2): 3000 [IU] via INTRAVENOUS
  Administered 2017-02-09: 6000 [IU] via INTRAVENOUS
  Administered 2017-02-09 (×2): 2000 [IU] via INTRAVENOUS

## 2017-02-09 MED ORDER — MORPHINE SULFATE (PF) 4 MG/ML IV SOLN
2.0000 mg | INTRAVENOUS | Status: DC | PRN
  Administered 2017-02-09 (×2): 4 mg via INTRAVENOUS
  Administered 2017-02-10 (×2): 2 mg via INTRAVENOUS
  Administered 2017-02-10: 4 mg via INTRAVENOUS
  Administered 2017-02-10 (×2): 2 mg via INTRAVENOUS
  Administered 2017-02-10 (×2): 4 mg via INTRAVENOUS
  Administered 2017-02-11 (×2): 2 mg via INTRAVENOUS
  Filled 2017-02-09 (×12): qty 1

## 2017-02-09 MED ORDER — ESMOLOL HCL 100 MG/10ML IV SOLN
INTRAVENOUS | Status: DC | PRN
  Administered 2017-02-09 (×2): 5 mg via INTRAVENOUS

## 2017-02-09 MED ORDER — MIDAZOLAM HCL 5 MG/5ML IJ SOLN
INTRAMUSCULAR | Status: DC | PRN
  Administered 2017-02-09 (×2): 1 mg via INTRAVENOUS

## 2017-02-09 SURGICAL SUPPLY — 72 items
CANISTER SUCT 3000ML PPV (MISCELLANEOUS) ×4 IMPLANT
CLIP VESOCCLUDE MED 24/CT (CLIP) ×4 IMPLANT
CLIP VESOCCLUDE SM WIDE 24/CT (CLIP) ×4 IMPLANT
CONNECTOR Y ATS VAC SYSTEM (MISCELLANEOUS) ×4 IMPLANT
COVER MAYO STAND STRL (DRAPES) IMPLANT
DERMABOND ADVANCED (GAUZE/BANDAGES/DRESSINGS) ×2
DERMABOND ADVANCED .7 DNX12 (GAUZE/BANDAGES/DRESSINGS) ×2 IMPLANT
DRSG VAC ATS SM SENSATRAC (GAUZE/BANDAGES/DRESSINGS) ×8 IMPLANT
ELECT BLADE 4.0 EZ CLEAN MEGAD (MISCELLANEOUS) ×4
ELECT BLADE 6.5 EXT (BLADE) IMPLANT
ELECT CAUTERY BLADE 6.4 (BLADE) IMPLANT
ELECT REM PT RETURN 9FT ADLT (ELECTROSURGICAL) ×4
ELECTRODE BLDE 4.0 EZ CLN MEGD (MISCELLANEOUS) ×2 IMPLANT
ELECTRODE REM PT RTRN 9FT ADLT (ELECTROSURGICAL) ×2 IMPLANT
FELT TEFLON 1X6 (MISCELLANEOUS) ×4 IMPLANT
GLOVE BIO SURGEON STRL SZ 6.5 (GLOVE) ×6 IMPLANT
GLOVE BIO SURGEONS STRL SZ 6.5 (GLOVE) ×2
GLOVE BIOGEL PI IND STRL 6.5 (GLOVE) ×8 IMPLANT
GLOVE BIOGEL PI IND STRL 7.0 (GLOVE) ×6 IMPLANT
GLOVE BIOGEL PI IND STRL 7.5 (GLOVE) ×4 IMPLANT
GLOVE BIOGEL PI INDICATOR 6.5 (GLOVE) ×8
GLOVE BIOGEL PI INDICATOR 7.0 (GLOVE) ×6
GLOVE BIOGEL PI INDICATOR 7.5 (GLOVE) ×4
GLOVE ECLIPSE 6.5 STRL STRAW (GLOVE) ×12 IMPLANT
GLOVE ECLIPSE 7.0 STRL STRAW (GLOVE) ×4 IMPLANT
GLOVE SURG SS PI 6.5 STRL IVOR (GLOVE) ×4 IMPLANT
GLOVE SURG SS PI 7.5 STRL IVOR (GLOVE) ×4 IMPLANT
GOWN STRL REUS W/ TWL LRG LVL3 (GOWN DISPOSABLE) ×12 IMPLANT
GOWN STRL REUS W/ TWL XL LVL3 (GOWN DISPOSABLE) ×2 IMPLANT
GOWN STRL REUS W/TWL LRG LVL3 (GOWN DISPOSABLE) ×12
GOWN STRL REUS W/TWL XL LVL3 (GOWN DISPOSABLE) ×2
GRAFT HEMASHIELD 14X7MM (Vascular Products) ×4 IMPLANT
HEMOSTAT SNOW SURGICEL 2X4 (HEMOSTASIS) ×4 IMPLANT
INSERT FOGARTY 61MM (MISCELLANEOUS) ×4 IMPLANT
INSERT FOGARTY SM (MISCELLANEOUS) ×8 IMPLANT
KIT BASIN OR (CUSTOM PROCEDURE TRAY) ×4 IMPLANT
KIT ROOM TURNOVER OR (KITS) ×4 IMPLANT
NS IRRIG 1000ML POUR BTL (IV SOLUTION) ×8 IMPLANT
PACK AORTA (CUSTOM PROCEDURE TRAY) ×4 IMPLANT
PAD ARMBOARD 7.5X6 YLW CONV (MISCELLANEOUS) ×8 IMPLANT
PATCH VASC XENOSURE 1CMX6CM (Vascular Products) ×2 IMPLANT
PATCH VASC XENOSURE 1X6 (Vascular Products) ×2 IMPLANT
PENCIL BUTTON HOLSTER BLD 10FT (ELECTRODE) IMPLANT
SPONGE LAP 18X18 X RAY DECT (DISPOSABLE) ×8 IMPLANT
SUT ETHIBOND 5 LR DA (SUTURE) IMPLANT
SUT PDS AB 1 TP1 54 (SUTURE) ×8 IMPLANT
SUT PROLENE 1 CT (SUTURE) ×4 IMPLANT
SUT PROLENE 3 0 SH 48 (SUTURE) ×8 IMPLANT
SUT PROLENE 3 0 SH DA (SUTURE) ×20 IMPLANT
SUT PROLENE 5 0 C 1 24 (SUTURE) ×24 IMPLANT
SUT PROLENE 5 0 C 1 36 (SUTURE) ×16 IMPLANT
SUT PROLENE 5 0 CC 1 (SUTURE) ×4 IMPLANT
SUT PROLENE 6 0 BV (SUTURE) ×4 IMPLANT
SUT PROLENE 6 0 CC (SUTURE) ×4 IMPLANT
SUT SILK 2 0 (SUTURE) ×2
SUT SILK 2 0 TIES 17X18 (SUTURE) ×2
SUT SILK 2 0SH CR/8 30 (SUTURE) ×4 IMPLANT
SUT SILK 2-0 18XBRD TIE 12 (SUTURE) ×2 IMPLANT
SUT SILK 2-0 18XBRD TIE BLK (SUTURE) ×2 IMPLANT
SUT SILK 3 0 (SUTURE) ×4
SUT SILK 3 0 TIES 17X18 (SUTURE) ×2
SUT SILK 3-0 18XBRD TIE 12 (SUTURE) ×4 IMPLANT
SUT SILK 3-0 18XBRD TIE BLK (SUTURE) ×2 IMPLANT
SUT VIC AB 2-0 CT1 27 (SUTURE) ×12
SUT VIC AB 2-0 CT1 TAPERPNT 27 (SUTURE) ×12 IMPLANT
SUT VIC AB 3-0 SH 27 (SUTURE) ×8
SUT VIC AB 3-0 SH 27X BRD (SUTURE) ×8 IMPLANT
SUT VICRYL 4-0 PS2 18IN ABS (SUTURE) ×16 IMPLANT
TOWEL BLUE STERILE X RAY DET (MISCELLANEOUS) ×8 IMPLANT
TRAY FOLEY W/METER SILVER 16FR (SET/KITS/TRAYS/PACK) ×4 IMPLANT
WATER STERILE IRR 1000ML POUR (IV SOLUTION) ×8 IMPLANT
WND VAC CANISTER 500ML (MISCELLANEOUS) ×4 IMPLANT

## 2017-02-09 NOTE — Progress Notes (Signed)
S/P ABF and extensive femoral endarterecomy  Resting comfortably Abd soft Incisional vacs in place Brisk bilateral doppler signals in both feet Daughter at bedside  Durene CalWells Brabham

## 2017-02-09 NOTE — Interval H&P Note (Signed)
History and Physical Interval Note:  02/09/2017 7:25 AM  Charlotte Salas  has presented today for surgery, with the diagnosis of Peripheral Vascular Disease with Bilateral Claudication  I70.213 AAA  I71.4  The various methods of treatment have been discussed with the patient and family. After consideration of risks, benefits and other options for treatment, the patient has consented to  Procedure(s): AORTA BIFEMORAL BYPASS GRAFT (N/A) as a surgical intervention .  The patient's history has been reviewed, patient examined, no change in status, stable for surgery.  I have reviewed the patient's chart and labs.  Questions were answered to the patient's satisfaction.     Durene CalBrabham, [place]

## 2017-02-09 NOTE — H&P (View-Only) (Signed)
[facility]  Patient name: Charlotte Salas Merriwether MRN: [id number] DOB: 06/09/1946 Sex: female   REASON FOR VISIT:    Follow up  HISOTRY OF PRESENT ILLNESS:    Charlotte Salas Charlotte Salas is a 71 y.o. female who is here today for follow-up of her leg pain.  She reports burning and numbness with swelling in both legs.  Earlier this year she underwent venous ablation of the bilateral great and small saphenous veins and [place].  This did not alleviate her symptoms.  She was having difficulty walking approximately 100 feet a cousin of hip pain.  She underwent diagnostic angiography recently which showed left distal external iliac and common femoral occlusion with severe stenosis within the right common femoral artery.  She is also found to have an abdominal aortic aneurysm.  She is sent for a CT scan to further evaluate this.  She is here today for further discussions.   The patient is a diabetic.  Her A1c ranges 5.8-6.8.  She has a history of coronary artery disease and is status post non-STEMI.  She has been stented in the past.  She is medically managed for hypertension with ACE inhibitor.  She takes a statin for hypercholesterolemia.  She is a current smoker.  PAST MEDICAL HISTORY:   Past Medical History:  Diagnosis Date  . Atrial septal defect    hx of it  . CAD (coronary artery disease)    s/p prior stenting of the right coronary artery in '01 and non-ST-elevation myocardial infarction in jan '09, with a new bare-metal stent placed in the ostium of the left anterior descending coronary artert and the coronary anatomy as described above with total occlusion of the previously placed right coronary artery stent. EF 35-40%  . History of cervical cancer   . History of non-Hodgkin's lymphoma   . HTN (hypertension)   . HTN (hypertension)   . Hyperlipidemia   . Other and unspecified hyperlipidemia   . Personal history of malignant  neoplasm of cervix uteri   . Personal history of other lymphatic and hematopoietic neoplasm    hodgkins lymphoma     FAMILY HISTORY:   Family History  Problem Relation Age of Onset  . Leukemia Father   . Heart Problems Mother   . Heart attack Sister     SOCIAL HISTORY:   Social History  Substance Use Topics  . Smoking status: Current Every Day Smoker    Packs/day: 0.50    Years: 50.00    Types: Cigarettes, E-cigarettes  . Smokeless tobacco: Current User    Types: Chew     Comment: 1 ppd  . Alcohol use No     ALLERGIES:   Allergies  Allergen Reactions  . Codeine Nausea Only     CURRENT MEDICATIONS:   Current Outpatient Prescriptions  Medication Sig Dispense Refill  . aspirin EC 81 MG tablet Take 81 mg by mouth daily.    . B Complex Vitamins (B-COMPLEX/B-12 PO) Take 1 tablet by mouth daily.     Charlotte Salas. [START ON 01/26/2017] carvedilol (COREG) 12.5 MG tablet TAKE 1 TABLET (12.5 MG TOTAL) BY MOUTH 2 (TWO) TIMES DAILY. 180 tablet 1  . Cranberry 1000 MG CAPS Take 1 capsule by mouth daily.     Charlotte Salas. [START ON 01/26/2017] lisinopril (PRINIVIL,ZESTRIL) 10 MG tablet Take 1 tablet (10 mg total) by mouth daily. 90 tablet 1  . metFORMIN (GLUCOPHAGE) 500 MG tablet Take by mouth 2 (two) times daily with a  meal.    . omega-3 acid ethyl esters (LOVAZA) 1 g capsule Take by mouth 2 (two) times daily.    . pregabalin (LYRICA) 75 MG capsule Take 75 mg by mouth 2 (two) times daily.    Charlotte Salas. [START ON 01/26/2017] simvastatin (ZOCOR) 20 MG tablet Take 1 tablet (20 mg total) by mouth daily. 90 tablet 1  . traMADol (ULTRAM) 50 MG tablet Take 50 mg by mouth every 6 (six) hours as needed for moderate pain or severe pain.     . nitroGLYCERIN (NITROSTAT) 0.4 MG SL tablet Place 1 tablet (0.4 mg total) under the tongue every 5 (five) minutes as needed for chest pain (for chest pain). (Patient not taking: Reported on 01/24/2017) 25 tablet 6   No current facility-administered medications for this visit.      REVIEW OF SYSTEMS:   [X]  denotes positive finding, [ ]  denotes negative finding Cardiac  Comments:  Chest pain or chest pressure:    Shortness of breath upon exertion:    Short of breath when lying flat:    Irregular heart rhythm:        Vascular    Pain in calf, thigh, or hip brought on by ambulation: x   Pain in feet at night that wakes you up from your sleep:     Blood clot in your veins:    Leg swelling:         Pulmonary    Oxygen at home:    Productive cough:     Wheezing:         Neurologic    Sudden weakness in arms or legs:     Sudden numbness in arms or legs:     Sudden onset of difficulty speaking or slurred speech:    Temporary loss of vision in one eye:     Problems with dizziness:         Gastrointestinal    Blood in stool:     Vomited blood:         Genitourinary    Burning when urinating:     Blood in urine:        Psychiatric    Major depression:         Hematologic    Bleeding problems:    Problems with blood clotting too easily:        Skin    Rashes or ulcers:        Constitutional    Fever or chills:      PHYSICAL EXAM:   Vitals:   01/24/17 1211  BP: 132/68  Pulse: (!) 59  Resp: 16  Temp: 97.8 F (36.6 C)  TempSrc: Oral  SpO2: 96%  Weight: 134 lb 4 oz (60.9 kg)  Height: 4' 11.5" (1.511 m)    GENERAL: The patient is a well-nourished female, in no acute distress. The vital signs are documented above. CARDIAC: There is a regular rate and rhythm.  VASCULAR: Nonpalpable pedal pulses PULMONARY: Non-labored respirations ABDOMEN: Soft and non-tender MUSCULOSKELETAL: There are no major deformities or cyanosis. NEUROLOGIC: No focal weakness or paresthesias are detected. SKIN: There are no ulcers or rashes noted. PSYCHIATRIC: The patient has a normal affect.  STUDIES:   I have reviewed her CT scan with the following findings: Aortic atherosclerosis.  Coronary artery calcifications are noted suggesting coronary  artery disease.  5 mm nodule seen in left lung apex, with another 6 mm nodule seen in left upper lobe. Initial follow-up with CT at 6-12 months  is recommended to confirm persistence. If persistent, repeat CT is recommended every 2 years until 5 years of stability has been established. This recommendation follows the consensus statement: Guidelines for Management of Incidental Pulmonary Nodules Detected on CT Images: From the Fleischner Society 2017; Radiology 2017; 284:228-243.  3.9 cm infrarenal abdominal aortic aneurysm is noted. Recommend followup by ultrasound in 2 years. This recommendation follows ACR consensus guidelines: White Paper of the ACR Incidental Findings Committee II on Vascular Findings. Salas Am Coll Radiol 2013; 10:789-794.  Calcified plaque noted at origin of superior mesenteric artery resulting in moderate focal stenosis. 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  Severe focal stenosis is noted at the origin of each renal artery secondary to calcified plaque.  Atherosclerotic calcifications are also noted in the iliac arteries, with moderate to severe focal stenosis noted in the proximal portion of each external iliac artery. Moderate to severe focal stenosis is also noted in the distal portion of the left external iliac artery.   MEDICAL ISSUES:   Lung nodule: I have ordered a CT scan for follow-up in 6 months  Claudication and abdominal aortic aneurysm: After lengthy discussion, we have decided to proceed with an aortobifemoral bypass graft.  This should address her claudication symptoms as well as treat her aneurysm.  I discussed risks and benefits of the operation including the risk of renal, GI, arterial complications.  We also discussed the risk of stroke and MI as well as bleeding and wound complications.  The patient wants to get this done.  I placed her on the schedule for Wednesday, August 29.  I will get cardiac clearance  from Dr. Wyline Mood.  All of her questions were answered today.    Durene Cal, MD [facility] [phone] Pager [phone]

## 2017-02-09 NOTE — Anesthesia Procedure Notes (Signed)
Procedure Name: Intubation Date/Time: 02/09/2017 7:48 AM Performed by: Shirlyn Goltz Pre-anesthesia Checklist: Patient identified, Emergency Drugs available, Suction available and Patient being monitored Patient Re-evaluated:Patient Re-evaluated prior to induction Oxygen Delivery Method: Circle system utilized Preoxygenation: Pre-oxygenation with 100% oxygen Induction Type: IV induction Ventilation: Mask ventilation without difficulty and Oral airway inserted - appropriate to patient size Laryngoscope Size: Mac and 3 Grade View: Grade I Tube type: Oral Tube size: 7.0 mm Number of attempts: 1 Airway Equipment and Method: Stylet Placement Confirmation: ETT inserted through vocal cords under direct vision,  positive ETCO2 and breath sounds checked- equal and bilateral Secured at: 20 cm Tube secured with: Tape Dental Injury: Teeth and Oropharynx as per pre-operative assessment

## 2017-02-09 NOTE — Progress Notes (Signed)
Dr Mal AmabileBrock called and informed of Bp readings. No new orders at this time.

## 2017-02-09 NOTE — Transfer of Care (Signed)
Immediate Anesthesia Transfer of Care Note  Patient: Charlotte Salas  Procedure(s) Performed: Procedure(s): AORTA BIFEMORAL BYPASS GRAFT USING HEMASHIELD GOLD (N/A) LEFT PROFUNDA  FEMORAL ARTERY ANGIOPLASTY USING XENOSURE BIOLOGIC PATCH (Left)  Patient Location: PACU  Anesthesia Type:General  Level of Consciousness: awake, alert , oriented and patient cooperative  Airway & Oxygen Therapy: Patient Spontanous Breathing and Patient connected to face mask oxygen  Post-op Assessment: Report given to RN and Post -op Vital signs reviewed and stable  Post vital signs: Reviewed and stable  Last Vitals:  Vitals:   02/09/17 0553 02/09/17 0554  BP: (!) 143/30 (!) 122/42  Pulse:    Resp:    Temp:    SpO2:      Last Pain:  Vitals:   02/09/17 0552  TempSrc: Oral         Complications: No apparent anesthesia complications

## 2017-02-09 NOTE — Anesthesia Procedure Notes (Signed)
Arterial Line Insertion Start/End8/29/2018 7:08 AM, 02/09/2017 7:02 AM Performed by: Leslye PeerBROCK, THOMAS E  Patient location: Pre-op. Preanesthetic checklist: patient identified, IV checked, site marked, risks and benefits discussed, surgical consent, monitors and equipment checked, pre-op evaluation, timeout performed and anesthesia consent Lidocaine 1% used for infiltration Left, radial was placed Catheter size: 20 Fr Hand hygiene performed  and maximum sterile barriers used   Attempts: 1 Procedure performed using ultrasound guided technique. Ultrasound Notes:no ultrasound evidence of intravascular and/or intraneural injection Following insertion, dressing applied and Biopatch. Post procedure assessment: normal and unchanged  Patient tolerated the procedure well with no immediate complications. Additional procedure comments: Performed by Dr. Mal AmabileBrock after several unsuccessful attempts by nurse anesthetist. Ultrasound guidance used, unfortunately image did not print appropriately..Marland Kitchen

## 2017-02-09 NOTE — Anesthesia Postprocedure Evaluation (Signed)
Anesthesia Post Note  Patient: Charlotte Salas  Procedure(s) Performed: Procedure(s) (LRB): AORTA BIFEMORAL BYPASS GRAFT USING HEMASHIELD GOLD (N/A) LEFT PROFUNDA  FEMORAL ARTERY ANGIOPLASTY USING XENOSURE BIOLOGIC PATCH (Left)     Patient location during evaluation: PACU Anesthesia Type: General Level of consciousness: awake and alert Pain management: pain level controlled Vital Signs Assessment: post-procedure vital signs reviewed and stable Respiratory status: spontaneous breathing, nonlabored ventilation, respiratory function stable and patient connected to nasal cannula oxygen Cardiovascular status: blood pressure returned to baseline and stable Postop Assessment: no signs of nausea or vomiting Anesthetic complications: no    Last Vitals:  Vitals:   02/09/17 1645 02/09/17 1700  BP:    Pulse: (!) 35 (!) 37  Resp: 12 17  Temp:    SpO2: 98% 98%    Last Pain:  Vitals:   02/09/17 1700  TempSrc:   PainSc: Asleep                 Beryle Lathehomas E Brock

## 2017-02-09 NOTE — Progress Notes (Signed)
[facility] [place]  Subjective  - alert and moving all 4 extremities.   Objective (!) 123/35 (!) 34 98.1 F (36.7 C) 15 98%  Intake/Output Summary (Last 24 hours) at 02/09/17 1640 Last data filed at 02/09/17 1459  Gross per 24 hour  Intake             3800 ml  Output             1585 ml  Net             2215 ml    Doppler right DP/PT, left PT Groins soft without hematoma, wound vac to suction Abdominal incision intact, soft  Assessment/Planning: POD # 1  #1: Aorto bifemoral bypass graft using 14 x 7 dacryon graft                         #2:R & G AAA                         #3:  Bilateral superficial femoral and profunda femoral endarterectomy                                #4: Left profunda femoral patch angioplasty using bovine pericardial patch                         #5: Bilateral placement of incisional wound VAC Disposition stable in PACU waiting for ICU bed  Thomasena Edis [facility] MAUREEN 02/09/2017 4:40 PM --  Laboratory Lab Results:  Recent Labs  02/07/17 1154 02/09/17 1530  WBC 9.9 19.1*  HGB 12.6 10.4*  HCT 39.0 31.9*  PLT 240 157   BMET  Recent Labs  02/07/17 1154 02/09/17 1530  NA 138 138  K 4.7 4.8  CL 109 110  CO2 20* 21*  GLUCOSE 106* 182*  BUN 20 18  CREATININE 0.96 1.07*  CALCIUM 9.2 7.7*    COAG Lab Results  Component Value Date   INR 1.32 02/09/2017   INR 0.97 02/07/2017   INR 0.9 07/14/2007   No results found for: PTT

## 2017-02-09 NOTE — Op Note (Signed)
Patient name: Charlotte Salas MRN: [id number] DOB: May 28, 1946 Sex: female  02/09/2017 Pre-operative Diagnosis: Aortoiliac occlusive disease, abdominal aortic aneurysm Post-operative diagnosis:  Same Surgeon:  Durene CalBrabham, Wells Assistants:  Lemar LivingsBrandon Cain, Doreatha MassedSamantha Rhyne Procedure:   #1: Aorto bifemoral bypass graft using 14 x 7 dacryon graft   #2:R & G AAA   #3:  Bilateral superficial femoral and profunda femoral endarterectomy    #4: Left profunda femoral patch angioplasty using bovine pericardial patch   #5: Bilateral placement of incisional wound VAC Anesthesia:  Gen. Blood Loss:  See anesthesia record Specimens:  None  Findings:  Extensive calcified plaque from the renals distally.  I performed a end to end anastomosis to the infrarenal abdominal aorta after performing endarterectomy at this level.  The aorta was ligated at the bifurcation to exclude the aneurysm.  Bilateral common femoral arteries were occluded.  Extensive disease was noted within the superficial femoral artery that went beyond the incision.  I performed an eversion endarterectomy of approximately one centimeters worth of plaque within the profunda femoral artery on the right.  On the left, the calcified plaque went on the profunda approximately 5 cm.  I performed endarterectomy of this area and then used a bovine pericardial patch for patch angioplasty prior to placing the limb of the aortobifemoral graft on the distal common femoral artery  Indications:     The patient has severe hip and leg pain with ambulation.  Her workup revealed a 4.5 cm aneurysm.  She comes in today for repair of the aneurysm as well as her aortoiliac occlusive disease.    Procedure:  The patient was identified in the holding area and [facility]  The patient was then placed supine on the table. general anesthesia was administered.  The patient was prepped and draped in the usual sterile fashion.  A time out was called and antibiotics were  administered.  Longitudinal incisions were made in both groins.  Cautery was used to divide subcutaneous tissue down to the femoral sheath which was opened sharply.  I exposed the common femoral artery from the inguinal ligament down to the bifurcation.  Crossing circumflex veins were ligated under the inguinal ligament.  There was extensive disease throughout both superficial femoral arteries with no definitive end point.  There was also ostial stenosis within the right profunda femoral artery.  I did an extensive dissection of the left profunda femoral artery which had at least 5 cm and heavily calcified plaque.  Once the femoral arteries were exposed, the groins were packed with a moist lap pad and attention was turned towards the abdomen.  A midline incision was made from the xiphoid to below the umbilicus.  Cautery was used to divide subcutaneous tissues down to the fascia which was opened with cautery.  The peritoneal cavity was entered sharply and the peritoneum was opened throughout the length of the incision.  The abdomen was inspected and there was no gross pathology.  A Balfour was placed followed by a Omni tract retractor.  The ligament of Treitz was taken down sharply.  The transverse colon was reflected cephalad and the small bowel was mobilized to the patient's right.  The inferior mesenteric vein was then divided.  I then exposed the abdominal aortic aneurysm in the aorta.  I dissected out the aorta down to the bifurcation.  Once I had the common iliac arteries isolated, I created a tunnel between the 2 incisions down to the groin and passed  an umbilical tape.  I was careful to tunnel posterior to the ureter.  Attention was then turned towards the proximal aorta.  Identified the renal vein and protected this.  I dissected out the aorta up to the renal arteries.  It was heavily calcified, however close to the renal arteries there was a soft spot on the anterior surface which I felt I could clamp.   At this point, the patient was fully heparinized.  After the heparin circulated a Harken clamp was used to occlude the aorta proximally and a Sanger clamp distally.  I opened the old aneurysm with an 11 blade and curved Mayo scissors.  Multiple backbleeding lumbar arteries were ligated with 2-0 silk suture ligatures.  I transected the aorta above the aneurysm.  I also divided at the bifurcation.  I closed the distal aorta with 3-0 Prolene in 2 layers.  Using a clamp was removed.  Attention was then turned towards the proximal neck.  I performed an extensive endarterectomy of the proximal aorta until had an adequate cuff of tissue to sew to.  I selected a 14 x 7 bifurcated dacryon graft.  I used a felt strip and a 3-0 Prolene to perform a running anastomosis and a end to end manner.  I then brought each limb of the graft to the previously created tunnels to the groin.  I did the right side first.  The femoral vessels were occluded with vascular clamps.  I found an area where I could get into the common femoral artery with an 11 blade.  I opened approximately 2 cm of the common femoral artery and then approximately 3 cm onto the superficial femoral artery.  I performed endarterectomy of the common femoral and superficial femoral artery at this level.  I then everted the profundofemoral artery for approximately 1.5 cm and removed heavily calcified plaque down to this level.  I removed all embolic debris in nature at good distal endpoint.  The limb of the graft was spatulated to create a patch for the distal anastomosis which was done with running 5-0 Prolene.  Prior to completion, the appropriate flushing maneuvers were performed and the anastomosis was completed and blood flow was reestablished to the left leg.  Next attention was turned towards the left groin.  The femoral vessels were occluded.  I try to find area to get in to the artery and the common femoral artery but it was 100% occluded.  I then found a soft  spot on both the profunda femoral and the superficial femoral artery and open these up into the common femoral.  I performed an endarterectomy of the common femoral artery for approximately 3 cm.  I then perform endarterectomy down on the superficial femoral artery for approximately 3 cm.  There was extensive plaque in the profunda and after performing endarterectomy of approximate 5 cm, I selected a bovine pericardial patch and performed patch and plasty using running 6-0 Prolene.  I then cut the dacryon graft to the appropriate length and spatulated to create a patch type repair of the common femoral down onto the superficial femoral artery, incorporating the bovine patch it ended at the origin of the profunda as one of the sidewalls to the graft.  This was done with 5-0 Prolene.  Prior to completion, the appropriate flushing maneuvers were performed and the anastomosis was completed, bloodflow was reestablish the left leg.  At this point, the patient excellent multiphasic signals of bilateral superficial femoral profunda femoral arteries.  I  gave 50 mg of protamine.  Attention was then turned back towards the abdomen.  The inferior mesenteric artery was noted to be occluded and therefore not reimplanted.  I closed the aneurysm sac over top of the graft.  The retroperitoneal tissue was then reapproximated over the graft with 2-0 Vicryl.  The bowel was placed back into its anatomic position.  It was inspected to make sure there were no defects.  Catheters: Was then placed back into its position.  I closed the midline fascia with running #1 PDS suture 2.  The subcutaneous tissue was closed 2-0 Vicryl skin was closed with 4-0 Vicryl followed by Dermabond.  Next attention was turned back towards the groins.  These were hemostatic.  There irrigated.  The femoral sheaths were closed with 2-0 Vicryl followed by 3-0 Vicryl in the subcutaneous tissue and 4-0 Vicryl the skin.  I placed incisional wound vacs on both groin  incisions.  At this point, the patient had faint Doppler signals at the ankle and brisk signals in the popliteal space.  She was successfully extubated and taken to recovery in stable condition.   Disposition:  To PACU stable   V. Durene Cal, M.D. [facility] [place] Office: [phone] Pager:  [phone]

## 2017-02-09 NOTE — Anesthesia Procedure Notes (Signed)
Central Venous Catheter Insertion Performed by: Beryle Lathe, anesthesiologist Start/End8/29/2018 7:10 AM, 02/09/2017 7:19 AM Patient location: Pre-op. Preanesthetic checklist: patient identified, IV checked, site marked, risks and benefits discussed, surgical consent, monitors and equipment checked, pre-op evaluation, timeout performed and anesthesia consent Lidocaine 1% used for infiltration and patient sedated Hand hygiene performed  and maximum sterile barriers used  Catheter size: 8 Fr Total catheter length 16. Central line was placed.Double lumen Procedure performed using ultrasound guided technique. Ultrasound Notes:anatomy identified, needle tip was noted to be adjacent to the nerve/plexus identified, no ultrasound evidence of intravascular and/or intraneural injection and image(s) printed for medical record Attempts: 1 Following insertion, dressing applied, line sutured and Biopatch. Post procedure assessment: blood return through all ports and free fluid flow  Patient tolerated the procedure well with no immediate complications.

## 2017-02-09 NOTE — OR Nursing (Signed)
SBP 84/50, Art 104/40, NSS bolus 500 cc initiated per order.

## 2017-02-10 ENCOUNTER — Encounter (HOSPITAL_COMMUNITY): Payer: Self-pay | Admitting: Surgery

## 2017-02-10 ENCOUNTER — Inpatient Hospital Stay (HOSPITAL_COMMUNITY): Payer: Medicare Other

## 2017-02-10 LAB — CBC
HCT: 31.6 % — ABNORMAL LOW (ref 36.0–46.0)
HCT: 30.6 % — ABNORMAL LOW (ref 36.0–46.0)
Hemoglobin: 10.1 g/dL — ABNORMAL LOW (ref 12.0–15.0)
Hemoglobin: 9.7 g/dL — ABNORMAL LOW (ref 12.0–15.0)
MCH: 28.9 pg (ref 26.0–34.0)
MCH: 28.9 pg (ref 26.0–34.0)
MCHC: 32 g/dL (ref 30.0–36.0)
MCHC: 31.7 g/dL (ref 30.0–36.0)
MCV: 90.5 fL (ref 78.0–100.0)
MCV: 91.1 fL (ref 78.0–100.0)
PLATELETS: 190 10*3/uL (ref 150–400)
PLATELETS: 184 10*3/uL (ref 150–400)
RBC: 3.49 MIL/uL — ABNORMAL LOW (ref 3.87–5.11)
RBC: 3.36 MIL/uL — ABNORMAL LOW (ref 3.87–5.11)
RDW: 14 % (ref 11.5–15.5)
RDW: 14 % (ref 11.5–15.5)
WBC: 18.9 10*3/uL — AB (ref 4.0–10.5)
WBC: 19.5 10*3/uL — ABNORMAL HIGH (ref 4.0–10.5)

## 2017-02-10 LAB — GLUCOSE, CAPILLARY
GLUCOSE-CAPILLARY: 129 mg/dL — AB (ref 65–99)
Glucose-Capillary: 170 mg/dL — ABNORMAL HIGH (ref 65–99)
Glucose-Capillary: 120 mg/dL — ABNORMAL HIGH (ref 65–99)
Glucose-Capillary: 140 mg/dL — ABNORMAL HIGH (ref 65–99)

## 2017-02-10 LAB — BASIC METABOLIC PANEL
Anion gap: 4 — ABNORMAL LOW (ref 5–15)
BUN: 18 mg/dL (ref 6–20)
CALCIUM: 7.5 mg/dL — AB (ref 8.9–10.3)
CO2: 24 mmol/L (ref 22–32)
Chloride: 108 mmol/L (ref 101–111)
Creatinine, Ser: 1.02 mg/dL — ABNORMAL HIGH (ref 0.44–1.00)
GFR calc Af Amer: >60 mL/min (ref >60)
GFR, EST NON AFRICAN AMERICAN: 53 mL/min — AB (ref >60)
GLUCOSE: 163 mg/dL — AB (ref 65–99)
POTASSIUM: 4.4 mmol/L (ref 3.5–5.1)
Sodium: 136 mmol/L (ref 135–145)

## 2017-02-10 LAB — COMPREHENSIVE METABOLIC PANEL
ALBUMIN: 2.6 g/dL — AB (ref 3.5–5.0)
ALT: 14 U/L (ref 14–54)
ANION GAP: 4 — AB (ref 5–15)
AST: 52 U/L — AB (ref 15–41)
Alkaline Phosphatase: 37 U/L — ABNORMAL LOW (ref 38–126)
BUN: 20 mg/dL (ref 6–20)
CHLORIDE: 110 mmol/L (ref 101–111)
CO2: 22 mmol/L (ref 22–32)
Calcium: 7.4 mg/dL — ABNORMAL LOW (ref 8.9–10.3)
Creatinine, Ser: 1.38 mg/dL — ABNORMAL HIGH (ref 0.44–1.00)
GFR calc Af Amer: 43 mL/min — ABNORMAL LOW (ref >60)
GFR, EST NON AFRICAN AMERICAN: 37 mL/min — AB (ref >60)
GLUCOSE: 196 mg/dL — AB (ref 65–99)
POTASSIUM: 5.2 mmol/L — AB (ref 3.5–5.1)
Sodium: 136 mmol/L (ref 135–145)
Total Bilirubin: 0.8 mg/dL (ref 0.3–1.2)
Total Protein: 4.6 g/dL — ABNORMAL LOW (ref 6.5–8.1)

## 2017-02-10 LAB — MAGNESIUM: MAGNESIUM: 2.3 mg/dL (ref 1.7–2.4)

## 2017-02-10 LAB — AMYLASE: Amylase: 53 U/L (ref 28–100)

## 2017-02-10 MED ORDER — ORAL CARE MOUTH RINSE
15.0000 mL | Freq: Two times a day (BID) | OROMUCOSAL | Status: DC
  Administered 2017-02-10 (×2): 15 mL via OROMUCOSAL

## 2017-02-10 MED ORDER — BISACODYL 10 MG RE SUPP
10.0000 mg | Freq: Once | RECTAL | Status: AC
  Administered 2017-02-10: 10 mg via RECTAL

## 2017-02-10 MED ORDER — ORAL CARE MOUTH RINSE: 15.0000 mL | Freq: Two times a day (BID) | OROMUCOSAL | Status: DC

## 2017-02-10 MED ORDER — CHLORHEXIDINE GLUCONATE 0.12 % MT SOLN
15.0000 mL | Freq: Two times a day (BID) | OROMUCOSAL | Status: DC
  Administered 2017-02-10 – 2017-02-11 (×3): 15 mL via OROMUCOSAL
  Filled 2017-02-10 (×2): qty 15

## 2017-02-10 MED ORDER — INSULIN ASPART 100 UNIT/ML ~~LOC~~ SOLN
0.0000 [IU] | Freq: Three times a day (TID) | SUBCUTANEOUS | Status: DC
  Administered 2017-02-10: 2 [IU] via SUBCUTANEOUS
  Administered 2017-02-10 – 2017-02-11 (×2): 3 [IU] via SUBCUTANEOUS
  Administered 2017-02-11 (×2): 2 [IU] via SUBCUTANEOUS
  Administered 2017-02-12 (×2): 3 [IU] via SUBCUTANEOUS
  Administered 2017-02-12: 2 [IU] via SUBCUTANEOUS
  Administered 2017-02-13 – 2017-02-14 (×5): 3 [IU] via SUBCUTANEOUS
  Administered 2017-02-14: 5 [IU] via SUBCUTANEOUS
  Administered 2017-02-15: 3 [IU] via SUBCUTANEOUS
  Administered 2017-02-15: 2 [IU] via SUBCUTANEOUS
  Administered 2017-02-15 – 2017-02-16 (×2): 3 [IU] via SUBCUTANEOUS
  Administered 2017-02-16: 5 [IU] via SUBCUTANEOUS
  Administered 2017-02-16 – 2017-02-17 (×2): 2 [IU] via SUBCUTANEOUS
  Administered 2017-02-17 (×2): 3 [IU] via SUBCUTANEOUS
  Administered 2017-02-18: 5 [IU] via SUBCUTANEOUS
  Administered 2017-02-18: 3 [IU] via SUBCUTANEOUS

## 2017-02-10 MED ORDER — ENOXAPARIN SODIUM 30 MG/0.3ML ~~LOC~~ SOLN
30.0000 mg | Freq: Two times a day (BID) | SUBCUTANEOUS | Status: DC
  Administered 2017-02-10 – 2017-02-13 (×6): 30 mg via SUBCUTANEOUS
  Filled 2017-02-10 (×6): qty 0.3

## 2017-02-10 MED FILL — Heparin Sodium (Porcine) Inj 1000 Unit/ML: INTRAMUSCULAR | Qty: 30 | Status: AC

## 2017-02-10 MED FILL — Sodium Chloride IV Soln 0.9%: INTRAVENOUS | Qty: 2000 | Status: AC

## 2017-02-10 NOTE — Progress Notes (Signed)
AAA Progress Note    02/10/2017 7:19 AM 1 Day Post-Op  Subjective:  Pt c/o throat soreness and tummy soreness; says she can tell a difference in her feet, especially the left and wants to hug Dr. Myra GianottiBrabham for helping her.  Denies any chest pain.  Afebrile HR  50's-90's NSR 110's-160's systolic 96% 2LO2NC  Vitals:   02/10/17 0600 02/10/17 0636  BP:    Pulse: 65 (!) 57  Resp: (!) 8 (!) 9  Temp:    SpO2: 93% 94%    Physical Exam: Cardiac:  Regluar Lungs:  Decreased breath sounds right base; left base is clear Abdomen:  Soft, mildly tender to palpation; -BS; -BM; -flatus Incisions:  Laparotomy incision is clean and dry; bilateral groins with vac with good seal Extremities:  Bilateral feet are warm with brisk DP/PT bilaterally.  CBC    Component Value Date/Time   WBC 18.9 (H) 02/10/2017 0509   RBC 3.49 (L) 02/10/2017 0509   HGB 10.1 (L) 02/10/2017 0509   HCT 31.6 (L) 02/10/2017 0509   PLT 190 02/10/2017 0509   MCV 90.5 02/10/2017 0509   MCH 28.9 02/10/2017 0509   MCHC 32.0 02/10/2017 0509   RDW 14.0 02/10/2017 0509   LYMPHSABS 2.9 01/02/2010 1100   MONOABS 0.6 01/02/2010 1100   EOSABS 0.2 01/02/2010 1100   BASOSABS 0.0 01/02/2010 1100    BMET    Component Value Date/Time   NA 136 02/10/2017 0509   K 5.2 (H) 02/10/2017 0509   CL 110 02/10/2017 0509   CO2 22 02/10/2017 0509   GLUCOSE 196 (H) 02/10/2017 0509   BUN 20 02/10/2017 0509   CREATININE 1.38 (H) 02/10/2017 0509   CALCIUM 7.4 (L) 02/10/2017 0509   GFRNONAA 37 (L) 02/10/2017 0509   GFRAA 43 (L) 02/10/2017 0509    INR    Component Value Date/Time   INR 1.32 02/09/2017 1530     Intake/Output Summary (Last 24 hours) at 02/10/17 0719 Last data filed at 02/10/17 0600  Gross per 24 hour  Intake          5603.33 ml  Output             2300 ml  Net          3303.33 ml     Assessment/Plan:  71 y.o. female is s/p  Aortobifemoral bypass grafting 1 Day Post-Op  -pt doing well this morning with  brisk doppler signals in both feet -mild hyperkalemia and increase in creatinine this am at 5.2 and 1.38 respectively.  IVF does not have supplemental K+.  Continue IVF and gentle diuresis with Lasix 20mg  IV x 1.   -acute surgical blood loss anemia-pt tolerating.  Received 550cc cell saver in the OR. -check labs in am -NGT with 250cc out - will keep this in today-continue npo -denies any flatus-will give dulcolax supp this am -continue SSI-will increase to moderate scale give FSG is 180's-190's -may transfer to floor later today -mobilize and out of bed to chiar tid at meal times.   Doreatha MassedSamantha Rhyne, PA-C [facility] [phone] 02/10/2017 7:19 AM   I agree with the above.  I have seen the patient.  POD#1, s/p ABF  CV:  BP stable.  Will check CVP to assist with volume status.  D/c A-line.  Frequent ectopy, will monitor electrolytes and adjust as indicated.  May need cardiology consult ID:  Peri-operative abx only GU:  Marginal UOP.  CVP 3.  If this drops off, she  will need volume replacement.  Monitor creatinine.  Keep foley in 1 more day for accurate output monitoring Prophylaxis:  Protonix for GI.  Start Lovenox for DVT GI:  Keep NG in 1 more day.  Continue NPO Hyperkalemia:  Repeat labs this pm Vascular:  Doing well with brisk pedal doppler signals Acute blood loss anemia:  Received cell saver only so far.  Monitor HCT.  No indications for transfusion PT/OT eval.  Will get OOB to chair today Keep in ICU 1 more day.  Durene Cal

## 2017-02-10 NOTE — Progress Notes (Signed)
OT Cancellation Note  Patient Details Name: Riley Lammma J Welke MRN: [id number] DOB: 02/11/1946   Cancelled Treatment:    Reason Eval/Treat Not Completed: Fatigue/lethargy limiting ability to participate.  Pt just back to bed and fatigued.  Will try back.  Gabryel Talamo Empireonarpe, OTR/L [phone]   Jeani HawkingConarpe, Radford Pease M 02/10/2017, 2:00 PM

## 2017-02-10 NOTE — Evaluation (Signed)
Physical Therapy Evaluation Patient Details Name: Charlotte Salas J Noy MRN: [id number] DOB: 05/07/1946 Today's Date: 02/10/2017   History of Present Illness  This 71 y.o. female admitted for aorto bifemoral bypass graft secondary to PVD with bil. claudication,  and AAA.  PMH includes:  DM, HTN, MI, cervical CA, hodgkin's lymphoma, ischemic cardiomyopathy, CHF, atrial septal defect  Clinical Impression  Patient is s/p above surgery resulting in functional limitations due to the deficits listed below (see PT Problem List). Pt currently modAx2 for bed mobility, transfers and ambulation of 2 feet. Patient will benefit from skilled PT to increase their independence and safety with mobility to allow discharge to the venue listed below.       Follow Up Recommendations Home health PT;Supervision/Assistance - 24 hour    Equipment Recommendations  3in1 (PT)    Recommendations for Other Services       Precautions / Restrictions Precautions Precautions: Fall Restrictions Weight Bearing Restrictions: No      Mobility  Bed Mobility Overal bed mobility: Needs Assistance Bed Mobility: Rolling;Sidelying to Sit Rolling: Mod assist Sidelying to sit: Max assist;+2 for physical assistance       General bed mobility comments: assist to move LEs off bed and to lift trunk   Transfers Overall transfer level: Needs assistance   Transfers: Sit to/from Stand;Stand Pivot Transfers Sit to Stand: Mod assist;+2 physical assistance Stand pivot transfers: Mod assist;+2 physical assistance       General transfer comment: Pt requires assist to boost into standing, and assist to maintain standing while pivoting, knees buckled as she approached the recliner resulting in uncontrolled descent.  Transferred bed < recliner <> BSC   Ambulation/Gait Ambulation/Gait assistance: Mod assist;+2 physical assistance Ambulation Distance (Feet): 2 Feet Assistive device: 2 person hand held assist Gait Pattern/deviations:  Step-to pattern;Shuffle Gait velocity: slowed Gait velocity interpretation: Below normal speed for age/gender General Gait Details: modAx2 for stability, vc for upright posture and anterior pelvic tilt        Balance Overall balance assessment: Needs assistance Sitting-balance support: Feet supported;Single extremity supported Sitting balance-Leahy Scale: Poor Sitting balance - Comments: min - mod A on EOB.  Pt with heavy Lt lateral lean - appears to be guarding due to pain    Standing balance support: Bilateral upper extremity supported;During functional activity Standing balance-Leahy Scale: Poor Standing balance comment: Pt requires mod A  to maintain static standing                              Pertinent Vitals/Pain Pain Assessment: Faces Faces Pain Scale: Hurts little more Pain Location: abdomen  Pain Descriptors / Indicators: Operative site guarding;Guarding Pain Intervention(s): Monitored during session;Limited activity within patient's tolerance    Home Living Family/patient expects to be discharged to:: Private residence Living Arrangements: Alone Available Help at Discharge: Family;Available 24 hours/day Type of Home: House Home Access: Stairs to enter   [employer] CorporationEntrance Stairs-Number of Steps: 1 Home Layout: One level Home Equipment: Walker - 2 wheels;Cane - single point Additional Comments: Pt's daughter will stay with her at discharge     Prior Function Level of Independence: Independent         Comments: Pt reports she was fully independent PTA, including driving and community activiites      Hand Dominance   Dominant Hand: Right    Extremity/Trunk Assessment   Upper Extremity Assessment Upper Extremity Assessment: Overall WFL for tasks assessed    Lower Extremity Assessment  Lower Extremity Assessment: Generalized weakness       Communication   Communication: No difficulties  Cognition Arousal/Alertness: Awake/alert Behavior During  Therapy: WFL for tasks assessed/performed Overall Cognitive Status: Within Functional Limits for tasks assessed                                        General Comments General comments (skin integrity, edema, etc.): VSS.  Dtr present         Assessment/Plan    PT Assessment Patient needs continued PT services  PT Problem List Decreased strength;Decreased activity tolerance;Decreased balance;Decreased mobility;Cardiopulmonary status limiting activity;Pain       PT Treatment Interventions Gait training;DME instruction;Functional mobility training;Therapeutic activities;Therapeutic exercise;Balance training;Patient/family education    PT Goals (Current goals can be found in the Care Plan section)  Acute Rehab PT Goals Patient Stated Goal: to get back to normal  PT Goal Formulation: With patient Time For Goal Achievement: 02/17/17 Potential to Achieve Goals: Good    Frequency Min 3X/week    AM-PAC PT "6 Clicks" Daily Activity  Outcome Measure Difficulty turning over in bed (including adjusting bedclothes, sheets and blankets)?: Unable Difficulty moving from lying on back to sitting on the side of the bed? : Unable Difficulty sitting down on and standing up from a chair with arms (e.g., wheelchair, bedside commode, etc,.)?: Unable Help needed moving to and from a bed to chair (including a wheelchair)?: A Lot Help needed walking in hospital room?: Total Help needed climbing 3-5 steps with a railing? : Total 6 Click Score: 7    End of Session Equipment Utilized During Treatment: Gait belt;Oxygen Activity Tolerance: Patient limited by fatigue Patient left: in chair;with family/visitor present Nurse Communication: Mobility status PT Visit Diagnosis: Unsteadiness on feet (R26.81);Other abnormalities of gait and mobility (R26.89);Muscle weakness (generalized) (M62.81);Difficulty in walking, not elsewhere classified (R26.2)    Time: 1610-9604 PT Time Calculation  (min) (ACUTE ONLY): 48 min   Charges:   PT Evaluation $PT Eval Moderate Complexity: 1 Mod PT Treatments $Therapeutic Activity: 8-22 mins   PT G Codes:        Jaevian Shean B. Beverely Risen PT, DPT Acute Rehabilitation  [phone] Pager [phone]    Elon Alas Fleet 02/10/2017, 5:45 PM

## 2017-02-10 NOTE — Care Management Note (Signed)
Case Management Note Donn PieriniKristi Roshan Salamon RN, BSN Unit 4E-Case Manager-- 2H coverage [phone]  Patient Details  Name: Charlotte Salas MRN: [id number] Date of Birth: 09/12/1945  Subjective/Objective:    Pt admitted s/p Aortobifemoral bypass grafting on 02/09/17                Action/Plan: PTA pt lived at home- CM to follow for d/c needs.   Expected Discharge Date:                  Expected Discharge Plan:  Home w Home Health Services  In-House Referral:     Discharge planning Services  CM Consult  Post Acute Care Choice:    Choice offered to:     DME Arranged:    DME Agency:     HH Arranged:    HH Agency:     Status of Service:  In process, will continue to follow  If discussed at Long Length of Stay Meetings, dates discussed:    Discharge Disposition:   Additional Comments:  Darrold SpanWebster, Nithila Sumners Hall, RN 02/10/2017, 10:37 AM

## 2017-02-10 NOTE — Evaluation (Signed)
Occupational Therapy Evaluation Patient Details Name: Charlotte Salas MRN: [id number] DOB: 06/30/1945 Today's Date: 02/10/2017    History of Present Illness This 71 y.o. female admitted for aorto bifemoral bypass graft secondary to PVD with bil. claudication,  and AAA.  PMH includes:  DM, HTN, MI, cervical CA, hodgkin's lymphoma, ischemic cardiomyopathy, CHF, atrial septal defect   Clinical Impression   Pt admitted with above. She demonstrates the below listed deficits and will benefit from continued OT to maximize safety and independence with BADLs.  Pt presents to OT with decreased activity tolerance, impaired balance, and pain.   She was fully independent PTA and lived alone.  She currently, requires mod - total A for ADLs and mod A +2 for functional transfers.  She is very motivated, and anticipate good progress.  Daughter will provide assist as needed at discharge. Will follow acutely.       Follow Up Recommendations  No OT follow up;Supervision/Assistance - 24 hour    Equipment Recommendations  Tub/shower bench    Recommendations for Other Services       Precautions / Restrictions Precautions Precautions: Fall      Mobility Bed Mobility Overal bed mobility: Needs Assistance Bed Mobility: Rolling;Sidelying to Sit Rolling: Mod assist Sidelying to sit: Max assist;+2 for physical assistance       General bed mobility comments: assist to move LEs off bed and to lift trunk   Transfers Overall transfer level: Needs assistance   Transfers: Sit to/from Stand;Stand Pivot Transfers Sit to Stand: Mod assist;+2 physical assistance Stand pivot transfers: Mod assist;+2 physical assistance       General transfer comment: Pt requires assist to boost into standing, and assist to maintain standing while pivoting, knees buckled as she approached the recliner resulting in uncontrolled descent.  Transferred bed < recliner <> BSC     Balance Overall balance assessment: Needs  assistance Sitting-balance support: Feet supported;Single extremity supported Sitting balance-Leahy Scale: Poor Sitting balance - Comments: min - mod A on EOB.  Pt with heavy Lt lateral lean - appears to be guarding due to pain    Standing balance support: Bilateral upper extremity supported;During functional activity Standing balance-Leahy Scale: Poor Standing balance comment: Pt requires mod A  to maintain static standing                            ADL either performed or assessed with clinical judgement   ADL Overall ADL's : Needs assistance/impaired Eating/Feeding: NPO   Grooming: Wash/dry hands;Wash/dry face;Oral care;Minimal assistance;Sitting   Upper Body Bathing: Moderate assistance;Sitting   Lower Body Bathing: Maximal assistance;Sit to/from stand   Upper Body Dressing : Maximal assistance;Sitting   Lower Body Dressing: Total assistance;Sit to/from stand   Toilet Transfer: Moderate assistance;+2 for physical assistance;Stand-pivot;BSC   Toileting- Clothing Manipulation and Hygiene: Total assistance;Sit to/from stand       Functional mobility during ADLs: Moderate assistance;+2 for physical assistance General ADL Comments: Pt limited by pain, and post operative weakness      Vision         Perception     Praxis      Pertinent Vitals/Pain Pain Assessment: Faces Faces Pain Scale: Hurts little more Pain Location: abdomen  Pain Descriptors / Indicators: Operative site guarding;Guarding Pain Intervention(s): Monitored during session;Limited activity within patient's tolerance;Repositioned     Hand Dominance Right   Extremity/Trunk Assessment Upper Extremity Assessment Upper Extremity Assessment: Overall WFL for tasks assessed   Lower  Extremity Assessment Lower Extremity Assessment: Defer to PT evaluation       Communication Communication Communication: No difficulties   Cognition Arousal/Alertness: Awake/alert Behavior During Therapy:  WFL for tasks assessed/performed Overall Cognitive Status: Within Functional Limits for tasks assessed                                     General Comments  VSS.  Dtr present     Exercises     Shoulder Instructions      Home Living Family/patient expects to be discharged to:: Private residence Living Arrangements: Alone Available Help at Discharge: Family;Available 24 hours/day Type of Home: House Home Access: Stairs to enter [employer] of Steps: 1   Home Layout: One level     Bathroom Shower/Tub: Tub/shower unit         Home Equipment: Environmental consultant - 2 wheels;Cane - single point   Additional Comments: Pt's daughter will stay with her at discharge       Prior Functioning/Environment Level of Independence: Independent        Comments: Pt reports she was fully independent PTA, including driving and community activiites         OT Problem List: Decreased strength;Decreased activity tolerance;Impaired balance (sitting and/or standing);Decreased safety awareness;Decreased knowledge of use of DME or AE;Pain      OT Treatment/Interventions: Self-care/ADL training;DME and/or AE instruction;Therapeutic activities;Patient/family education;Balance training    OT Goals(Current goals can be found in the care plan section) Acute Rehab OT Goals Patient Stated Goal: to get back to normal  OT Goal Formulation: With patient Time For Goal Achievement: 02/24/17 Potential to Achieve Goals: Good ADL Goals Pt Will Perform Grooming: with supervision;standing Pt Will Perform Upper Body Bathing: with supervision;with set-up;sitting Pt Will Perform Lower Body Bathing: with supervision;with adaptive equipment;sit to/from stand Pt Will Perform Upper Body Dressing: with supervision;with set-up;sitting Pt Will Perform Lower Body Dressing: with supervision;sit to/from stand;with adaptive equipment Pt Will Transfer to Toilet: with supervision;ambulating;regular height  toilet;grab bars Pt Will Perform Toileting - Clothing Manipulation and hygiene: with supervision;sit to/from stand Pt Will Perform Tub/Shower Transfer: Tub transfer;with min guard assist;ambulating;shower seat;tub bench;rolling walker  OT Frequency: Min 2X/week   Barriers to D/C:            Co-evaluation              AM-PAC PT "6 Clicks" Daily Activity     Outcome Measure Help from another person eating meals?: Total Help from another person taking care of personal grooming?: A Little Help from another person toileting, which includes using toliet, bedpan, or urinal?: A Lot Help from another person bathing (including washing, rinsing, drying)?: A Lot Help from another person to put on and taking off regular upper body clothing?: A Lot Help from another person to put on and taking off regular lower body clothing?: Total 6 Click Score: 11   End of Session Equipment Utilized During Treatment: Oxygen;Gait belt Nurse Communication: Mobility status  Activity Tolerance: Patient limited by pain Patient left: in chair;with call bell/phone within reach;with family/visitor present  OT Visit Diagnosis: Pain;Unsteadiness on feet (R26.81) Pain - part of body:  (abdomen )                Time: 1610-9604 OT Time Calculation (min): 48 min Charges:  OT General Charges $OT Visit: 1 Visit OT Evaluation $OT Eval Moderate Complexity: 1 Mod G-Codes:  Reynolds AmericanWendi Anamaria Dusenbury, OTR/L [phone]   Jeani HawkingConarpe, Maaliyah Adolph M 02/10/2017, 5:12 PM

## 2017-02-11 ENCOUNTER — Inpatient Hospital Stay (HOSPITAL_COMMUNITY): Payer: Medicare Other

## 2017-02-11 LAB — POCT I-STAT 7, (LYTES, BLD GAS, ICA,H+H)
ACID-BASE DEFICIT: 3 mmol/L — AB (ref 0.0–2.0)
Acid-base deficit: 3 mmol/L — ABNORMAL HIGH (ref 0.0–2.0)
BICARBONATE: 22.8 mmol/L (ref 20.0–28.0)
Bicarbonate: 21.3 mmol/L (ref 20.0–28.0)
CALCIUM ION: 1.15 mmol/L (ref 1.15–1.40)
Calcium, Ion: 1.21 mmol/L (ref 1.15–1.40)
HCT: 28 % — ABNORMAL LOW (ref 36.0–46.0)
HCT: 28 % — ABNORMAL LOW (ref 36.0–46.0)
HEMOGLOBIN: 9.5 g/dL — AB (ref 12.0–15.0)
Hemoglobin: 9.5 g/dL — ABNORMAL LOW (ref 12.0–15.0)
O2 Saturation: 96 %
O2 Saturation: 100 %
PH ART: 7.339 — AB (ref 7.350–7.450)
PO2 ART: 76 mmHg — AB (ref 83.0–108.0)
PO2 ART: 324 mmHg — AB (ref 83.0–108.0)
Patient temperature: 36.1
Patient temperature: 36.5
Potassium: 4.2 mmol/L (ref 3.5–5.1)
Potassium: 4.4 mmol/L (ref 3.5–5.1)
SODIUM: 140 mmol/L (ref 135–145)
Sodium: 140 mmol/L (ref 135–145)
TCO2: 22 mmol/L (ref 22–32)
TCO2: 24 mmol/L (ref 22–32)
pCO2 arterial: 32.5 mmHg (ref 32.0–48.0)
pCO2 arterial: 42 mmHg (ref 32.0–48.0)
pH, Arterial: 7.42 (ref 7.350–7.450)

## 2017-02-11 LAB — POCT I-STAT 3, ART BLOOD GAS (G3+)
Bicarbonate: 25 mmol/L (ref 20.0–28.0)
O2 SAT: 49 %
PO2 ART: 26 mmHg — AB (ref 83.0–108.0)
Patient temperature: 98.6
TCO2: 26 mmol/L (ref 22–32)
pCO2 arterial: 39.7 mmHg (ref 32.0–48.0)
pH, Arterial: 7.407 (ref 7.350–7.450)

## 2017-02-11 LAB — CBC
HCT: 27 % — ABNORMAL LOW (ref 36.0–46.0)
HEMATOCRIT: 28 % — AB (ref 36.0–46.0)
HEMOGLOBIN: 8.8 g/dL — AB (ref 12.0–15.0)
Hemoglobin: 8.8 g/dL — ABNORMAL LOW (ref 12.0–15.0)
MCH: 28.7 pg (ref 26.0–34.0)
MCH: 29.5 pg (ref 26.0–34.0)
MCHC: 31.4 g/dL (ref 30.0–36.0)
MCHC: 32.6 g/dL (ref 30.0–36.0)
MCV: 91.2 fL (ref 78.0–100.0)
MCV: 90.6 fL (ref 78.0–100.0)
PLATELETS: 169 10*3/uL (ref 150–400)
PLATELETS: 157 10*3/uL (ref 150–400)
RBC: 3.07 MIL/uL — ABNORMAL LOW (ref 3.87–5.11)
RBC: 2.98 MIL/uL — ABNORMAL LOW (ref 3.87–5.11)
RDW: 14.4 % (ref 11.5–15.5)
RDW: 14 % (ref 11.5–15.5)
WBC: 21.3 10*3/uL — AB (ref 4.0–10.5)
WBC: 22.2 10*3/uL — ABNORMAL HIGH (ref 4.0–10.5)

## 2017-02-11 LAB — BASIC METABOLIC PANEL
ANION GAP: 4 — AB (ref 5–15)
BUN: 12 mg/dL (ref 6–20)
CALCIUM: 7.4 mg/dL — AB (ref 8.9–10.3)
CO2: 24 mmol/L (ref 22–32)
Chloride: 105 mmol/L (ref 101–111)
Creatinine, Ser: 0.98 mg/dL (ref 0.44–1.00)
GFR calc Af Amer: >60 mL/min (ref >60)
GFR, EST NON AFRICAN AMERICAN: 56 mL/min — AB (ref >60)
GLUCOSE: 171 mg/dL — AB (ref 65–99)
Potassium: 4.3 mmol/L (ref 3.5–5.1)
Sodium: 133 mmol/L — ABNORMAL LOW (ref 135–145)

## 2017-02-11 LAB — POCT ACTIVATED CLOTTING TIME
ACTIVATED CLOTTING TIME: 279 s
ACTIVATED CLOTTING TIME: 197 s
ACTIVATED CLOTTING TIME: 208 s
Activated Clotting Time: 202 seconds
Activated Clotting Time: 224 seconds
Activated Clotting Time: 219 seconds

## 2017-02-11 LAB — COMPREHENSIVE METABOLIC PANEL
ALBUMIN: 2.4 g/dL — AB (ref 3.5–5.0)
ALT: 30 U/L (ref 14–54)
ANION GAP: 6 (ref 5–15)
AST: 63 U/L — ABNORMAL HIGH (ref 15–41)
Alkaline Phosphatase: 53 U/L (ref 38–126)
BUN: 12 mg/dL (ref 6–20)
CHLORIDE: 103 mmol/L (ref 101–111)
CO2: 25 mmol/L (ref 22–32)
Calcium: 7.6 mg/dL — ABNORMAL LOW (ref 8.9–10.3)
Creatinine, Ser: 1 mg/dL (ref 0.44–1.00)
GFR calc non Af Amer: 55 mL/min — ABNORMAL LOW (ref >60)
GLUCOSE: 155 mg/dL — AB (ref 65–99)
POTASSIUM: 4 mmol/L (ref 3.5–5.1)
Sodium: 134 mmol/L — ABNORMAL LOW (ref 135–145)
Total Bilirubin: 0.9 mg/dL (ref 0.3–1.2)
Total Protein: 5.1 g/dL — ABNORMAL LOW (ref 6.5–8.1)

## 2017-02-11 LAB — GLUCOSE, CAPILLARY
GLUCOSE-CAPILLARY: 148 mg/dL — AB (ref 65–99)
GLUCOSE-CAPILLARY: 139 mg/dL — AB (ref 65–99)
Glucose-Capillary: 181 mg/dL — ABNORMAL HIGH (ref 65–99)
Glucose-Capillary: 139 mg/dL — ABNORMAL HIGH (ref 65–99)

## 2017-02-11 MED ORDER — FUROSEMIDE 10 MG/ML IJ SOLN
20.0000 mg | Freq: Once | INTRAMUSCULAR | Status: AC
  Administered 2017-02-11: 20 mg via INTRAVENOUS
  Filled 2017-02-11: qty 2

## 2017-02-11 MED ORDER — ORAL CARE MOUTH RINSE
15.0000 mL | Freq: Two times a day (BID) | OROMUCOSAL | Status: DC
  Administered 2017-02-11 – 2017-02-13 (×5): 15 mL via OROMUCOSAL

## 2017-02-11 MED ORDER — FUROSEMIDE 10 MG/ML IJ SOLN
20.0000 mg | Freq: Once | INTRAMUSCULAR | Status: AC
Start: 2017-02-11 — End: 2017-02-11
  Administered 2017-02-11: 20 mg via INTRAVENOUS
  Filled 2017-02-11: qty 2

## 2017-02-11 MED ORDER — SODIUM CHLORIDE 0.9% FLUSH
10.0000 mL | Freq: Two times a day (BID) | INTRAVENOUS | Status: DC
  Administered 2017-02-11 – 2017-02-17 (×11): 10 mL

## 2017-02-11 MED ORDER — MORPHINE SULFATE (PF) 4 MG/ML IV SOLN
INTRAVENOUS | Status: AC
  Filled 2017-02-11: qty 1

## 2017-02-11 MED ORDER — CHLORHEXIDINE GLUCONATE 0.12 % MT SOLN
15.0000 mL | Freq: Two times a day (BID) | OROMUCOSAL | Status: DC
  Administered 2017-02-11 – 2017-02-13 (×5): 15 mL via OROMUCOSAL
  Filled 2017-02-11 (×5): qty 15

## 2017-02-11 MED ORDER — KETOROLAC TROMETHAMINE 15 MG/ML IJ SOLN
15.0000 mg | Freq: Four times a day (QID) | INTRAMUSCULAR | Status: DC | PRN
  Administered 2017-02-11 – 2017-02-13 (×6): 15 mg via INTRAVENOUS
  Filled 2017-02-11 (×6): qty 1

## 2017-02-11 MED ORDER — SODIUM CHLORIDE 0.9% FLUSH: 10.0000 mL | INTRAVENOUS | Status: DC | PRN

## 2017-02-11 MED ORDER — CHLORHEXIDINE GLUCONATE CLOTH 2 % EX PADS
6.0000 | MEDICATED_PAD | Freq: Every day | CUTANEOUS | Status: DC
  Administered 2017-02-11 – 2017-02-17 (×7): 6 via TOPICAL

## 2017-02-11 MED ORDER — MORPHINE SULFATE (PF) 4 MG/ML IV SOLN: 2.0000 mg | INTRAVENOUS | Status: DC | PRN

## 2017-02-11 MED ORDER — MORPHINE SULFATE (PF) 4 MG/ML IV SOLN
1.0000 mg | INTRAVENOUS | Status: DC | PRN
  Administered 2017-02-11: 1 mg via INTRAVENOUS
  Administered 2017-02-12 (×2): 2 mg via INTRAVENOUS
  Filled 2017-02-11 (×2): qty 1

## 2017-02-11 NOTE — Progress Notes (Signed)
Dr. Randie Heinzain notified of pt pain 8/10.  Unable to give anything else for relief at this time.  Pt noted to have increasing PAC's.  Order received.  Will continue to monitor closely.

## 2017-02-11 NOTE — Progress Notes (Signed)
Dr. Randie Heinzain paged re:  Pain in BLE.  Orders received may give Toradol early.  Will continue to closely monitor.

## 2017-02-11 NOTE — Progress Notes (Signed)
RN had contacted regarding increased weakness. Patient has been very sleepy. Neuro exam is intact. Moving all extremities equally.  02 sats have improved this am after lasix this am. 02 sats currently 93% on 4L. Lungs with fine rales on right and decreased breath sounds on left. Will give another small dose of lasix. Still having 8/10 pain. Will hold all narcotics now given sedation. Start toradol and monitor renal function. Labs this afternoon stable from this am.   Maris BergerKimberly Trinh, PA-C

## 2017-02-11 NOTE — Progress Notes (Signed)
Patient much more alert this afternoon, after holding narcotics.  Toradol given with better pain control.  Abdomen is tender but soft Breathing is better with IV lasix.  Continue to mobilize.  For some reason the patient is having trouble bearing weight on her feet.  She has intact motor and sensory function.  We will continue to encourage ambulation  Continue to monitor WBC.  No indication for ABX, but would start if she develops a fever.   Durene CalWells Norleen Xie

## 2017-02-11 NOTE — Progress Notes (Signed)
PA Rhyne notified of continued weakness, pt not able to use IS or flutter valve despite coaching and education.  Pt moving all extremities equally, remains oriented, but falls asleep while talking with staff or family. Pt also not participating in care.  Breath sounds more diminished than this morning, fewer crackles noted in bases after lasix.  Orders received.  Will continue to monitor pt closely.

## 2017-02-11 NOTE — Progress Notes (Signed)
Patient complaining of pain 6/10 has prn morphine ordered.Patient daughter at bedside stated,"I think mom has had enough pain medication for now . I think she is oversedated." Patient lying in bed with eyes open talking to this nurse asking for pain medication.This nurse gave patient 2 mg morphine IV for pain .Will continue to monitor patient.

## 2017-02-11 NOTE — Progress Notes (Signed)
Pt up to chair with heavy assist by 2 RNs.  Pt very weak, knees buckling, unable to hold her weight on her legs. Educated pt on importance of increasing activity, use of IS and flutter valve.  Will continue to monitor pt closely.

## 2017-02-11 NOTE — Progress Notes (Signed)
AAA Progress Note    02/11/2017 7:09 AM 2 Days Post-Op  Subjective:  Sleepy and not saying much.  RN reports that pt was talking and asking for pain medication earlier this morning but daughter did not want her to have anymore bc she felt she was sedated.  RN also reports that her O2 requirements have increased and she is not using the IS very well.  Wants flutter valve for pt.    Afebrile HR 60's-100's  80's-120's systolic 92% 3LO2NC  Vitals:   02/11/17 0500 02/11/17 0600  BP: (!) 104/56 (!) 104/52  Pulse: (!) 103 (!) 106  Resp: (!) 24 20  Temp:    SpO2: 90% 91%    Physical Exam: Cardiac:  regular Lungs:  Decreased BS at bases bilaterally Abdomen:  Soft; non tender to palpation; non distended; -BS; -Flatus Incisions:  Laparotomy incision is clean and dry; bilateral groins with vac with good seal.   Extremities:  Bilateral feet are warm and well perfused. Neuro:  Somewhat sleepy; alert to place, president, but not year.  CBC    Component Value Date/Time   WBC 21.3 (H) 02/11/2017 0438   RBC 3.07 (L) 02/11/2017 0438   HGB 8.8 (L) 02/11/2017 0438   HCT 28.0 (L) 02/11/2017 0438   PLT 169 02/11/2017 0438   MCV 91.2 02/11/2017 0438   MCH 28.7 02/11/2017 0438   MCHC 31.4 02/11/2017 0438   RDW 14.4 02/11/2017 0438   LYMPHSABS 2.9 01/02/2010 1100   MONOABS 0.6 01/02/2010 1100   EOSABS 0.2 01/02/2010 1100   BASOSABS 0.0 01/02/2010 1100    BMET    Component Value Date/Time   NA 133 (L) 02/11/2017 0438   K 4.3 02/11/2017 0438   CL 105 02/11/2017 0438   CO2 24 02/11/2017 0438   GLUCOSE 171 (H) 02/11/2017 0438   BUN 12 02/11/2017 0438   CREATININE 0.98 02/11/2017 0438   CALCIUM 7.4 (L) 02/11/2017 0438   GFRNONAA 56 (L) 02/11/2017 0438   GFRAA >60 02/11/2017 0438    INR    Component Value Date/Time   INR 1.32 02/09/2017 1530     Intake/Output Summary (Last 24 hours) at 02/11/17 0709 Last data filed at 02/11/17 0600  Gross per 24 hour  Intake              2370 ml  Output             1385 ml  Net              985 ml   PCXR 02/10/17:' IMPRESSION: Persistent bibasilar atelectasis or pneumonia. Small left pleural effusion, stable.  Mild cardiomegaly with decreased pulmonary vascular congestion.  Thoracic aortic atherosclerosis.    Assessment/Plan:  71 y.o. female is s/p  Aortobifemoral bypass grafting 2 Days Post-Op  -pt seen and examined with Dr. Myra Gianotti -CV:  BP stable but has trended down overnight and HR also stable but slow trend upward.  Continued ectoppy. -Pulmonary:  Oxygen requirements have increased overnight; pt sounds congested-will decrease IVF to 50cc/hr, give gentle IV diuresis (20mg  IV lasix), pulmonary toilet with percussion, OOB ambulating and very least up in chair.  Will get CXR this am to compare to yesterday and flutter valve.  -ID:  Leukocytosis slightly up from yesterday, however, she is afebrile.  Labs tomorrow.  Received peri op abx-may need abx given her bypass graft. -renal:  Renal function improved but will keep foley for now to continue to monitor since giving lasix.  Hyperkalemia improved  from yesterday. -GI:  Will keep NGT today since output has increased.  Abdomen is soft and non tender to palpation.  -Heme:   Acute surgical blood loss anemia:  Slight drop in hgb from yesterday-will hold on transfusion for now.  Check labs in am.  -vascular; bilateral feet are warm and well perfused. -glucose improved since increasing to moderate SSI  Keep in ICU  Charlotte MassedSamantha Rhyne, PA-C [facility] [phone] 02/11/2017 7:09 AM  I agree with the above.  I have seen and examined the patient

## 2017-02-12 ENCOUNTER — Inpatient Hospital Stay (HOSPITAL_COMMUNITY): Payer: Medicare Other

## 2017-02-12 DIAGNOSIS — I4892 Unspecified atrial flutter: Secondary | ICD-10-CM

## 2017-02-12 DIAGNOSIS — I340 Nonrheumatic mitral (valve) insufficiency: Secondary | ICD-10-CM

## 2017-02-12 DIAGNOSIS — I361 Nonrheumatic tricuspid (valve) insufficiency: Secondary | ICD-10-CM

## 2017-02-12 LAB — BASIC METABOLIC PANEL
Anion gap: 7 (ref 5–15)
BUN: 13 mg/dL (ref 6–20)
CALCIUM: 7.5 mg/dL — AB (ref 8.9–10.3)
CHLORIDE: 100 mmol/L — AB (ref 101–111)
CO2: 26 mmol/L (ref 22–32)
Creatinine, Ser: 1.04 mg/dL — ABNORMAL HIGH (ref 0.44–1.00)
GFR calc Af Amer: >60 mL/min (ref >60)
GFR calc non Af Amer: 52 mL/min — ABNORMAL LOW (ref >60)
GLUCOSE: 188 mg/dL — AB (ref 65–99)
POTASSIUM: 3.7 mmol/L (ref 3.5–5.1)
Sodium: 133 mmol/L — ABNORMAL LOW (ref 135–145)

## 2017-02-12 LAB — CBC
HCT: 31.4 % — ABNORMAL LOW (ref 36.0–46.0)
HEMATOCRIT: 22.8 % — AB (ref 36.0–46.0)
HEMOGLOBIN: 7.7 g/dL — AB (ref 12.0–15.0)
HEMOGLOBIN: 10.5 g/dL — AB (ref 12.0–15.0)
MCH: 30 pg (ref 26.0–34.0)
MCH: 28.7 pg (ref 26.0–34.0)
MCHC: 33.8 g/dL (ref 30.0–36.0)
MCHC: 33.4 g/dL (ref 30.0–36.0)
MCV: 88.7 fL (ref 78.0–100.0)
MCV: 85.8 fL (ref 78.0–100.0)
Platelets: 136 10*3/uL — ABNORMAL LOW (ref 150–400)
Platelets: 142 10*3/uL — ABNORMAL LOW (ref 150–400)
RBC: 2.57 MIL/uL — AB (ref 3.87–5.11)
RBC: 3.66 MIL/uL — AB (ref 3.87–5.11)
RDW: 13.8 % (ref 11.5–15.5)
RDW: 14.3 % (ref 11.5–15.5)
WBC: 15.7 10*3/uL — ABNORMAL HIGH (ref 4.0–10.5)
WBC: 16 10*3/uL — ABNORMAL HIGH (ref 4.0–10.5)

## 2017-02-12 LAB — GLUCOSE, CAPILLARY
GLUCOSE-CAPILLARY: 154 mg/dL — AB (ref 65–99)
GLUCOSE-CAPILLARY: 141 mg/dL — AB (ref 65–99)
Glucose-Capillary: 182 mg/dL — ABNORMAL HIGH (ref 65–99)
Glucose-Capillary: 136 mg/dL — ABNORMAL HIGH (ref 65–99)

## 2017-02-12 LAB — PREPARE RBC (CROSSMATCH)

## 2017-02-12 LAB — ECHOCARDIOGRAM COMPLETE
Height: 59.5 in
Weight: 2275.15 oz

## 2017-02-12 MED ORDER — PERFLUTREN LIPID MICROSPHERE
1.0000 mL | INTRAVENOUS | Status: AC | PRN
  Filled 2017-02-12: qty 10

## 2017-02-12 MED ORDER — FUROSEMIDE 10 MG/ML IJ SOLN
40.0000 mg | Freq: Once | INTRAMUSCULAR | Status: AC
  Administered 2017-02-12: 40 mg via INTRAVENOUS
  Filled 2017-02-12: qty 4

## 2017-02-12 MED ORDER — SODIUM CHLORIDE 0.9 % IV SOLN
Freq: Once | INTRAVENOUS | Status: AC
  Administered 2017-02-12: 06:00:00 via INTRAVENOUS

## 2017-02-12 MED ORDER — SODIUM CHLORIDE 0.9 % IV SOLN
0.0000 ug/min | INTRAVENOUS | Status: DC
  Filled 2017-02-12: qty 1

## 2017-02-12 MED ORDER — PERFLUTREN LIPID MICROSPHERE
INTRAVENOUS | Status: AC
  Administered 2017-02-12: 2 mL
  Filled 2017-02-12: qty 10

## 2017-02-12 MED ORDER — AMIODARONE LOAD VIA INFUSION
150.0000 mg | Freq: Once | INTRAVENOUS | Status: AC
  Administered 2017-02-12: 150 mg via INTRAVENOUS
  Filled 2017-02-12: qty 83.34

## 2017-02-12 MED ORDER — MORPHINE SULFATE (PF) 4 MG/ML IV SOLN
2.0000 mg | INTRAVENOUS | Status: DC | PRN
  Administered 2017-02-12 – 2017-02-13 (×8): 2 mg via INTRAVENOUS
  Filled 2017-02-12 (×9): qty 1

## 2017-02-12 MED ORDER — POTASSIUM CHLORIDE CRYS ER 20 MEQ PO TBCR
40.0000 meq | EXTENDED_RELEASE_TABLET | Freq: Once | ORAL | Status: AC
  Administered 2017-02-12: 40 meq via ORAL
  Filled 2017-02-12: qty 2

## 2017-02-12 MED ORDER — AMIODARONE HCL IN DEXTROSE 360-4.14 MG/200ML-% IV SOLN
60.0000 mg/h | INTRAVENOUS | Status: AC
  Filled 2017-02-12 (×2): qty 200

## 2017-02-12 MED ORDER — AMIODARONE HCL IN DEXTROSE 360-4.14 MG/200ML-% IV SOLN
30.0000 mg/h | INTRAVENOUS | Status: DC
  Administered 2017-02-12 – 2017-02-13 (×3): 30 mg/h via INTRAVENOUS
  Filled 2017-02-12 (×4): qty 200

## 2017-02-12 NOTE — Progress Notes (Signed)
  Amiodarone Drug - Drug Interaction Consult Note  Recommendations: No major interactions identified  Monitor vitals, QTc, Zofran use Amiodarone is metabolized by the cytochrome P450 system and therefore has the potential to cause many drug interactions. Amiodarone has an average plasma half-life of 50 days (range 20 to 100 days).   There is potential for drug interactions to occur several weeks or months after stopping treatment and the onset of drug interactions may be slow after initiating amiodarone.   []  Statins: Increased risk of myopathy. Simvastatin- restrict dose to 20mg  daily. Other statins: counsel patients to report any muscle pain or weakness immediately.  []  Anticoagulants: Amiodarone can increase anticoagulant effect. Consider warfarin dose reduction. Patients should be monitored closely and the dose of anticoagulant altered accordingly, remembering that amiodarone levels take several weeks to stabilize.  []  Antiepileptics: Amiodarone can increase plasma concentration of phenytoin, the dose should be reduced. Note that small changes in phenytoin dose can result in large changes in levels. Monitor patient and counsel on signs of toxicity.  [x]  Beta blockers: increased risk of bradycardia, AV block and myocardial depression. Sotalol - avoid concomitant use.  []   Calcium channel blockers (diltiazem and verapamil): increased risk of bradycardia, AV block and myocardial depression.  []   Cyclosporine: Amiodarone increases levels of cyclosporine. Reduced dose of cyclosporine is recommended.  []  Digoxin dose should be halved when amiodarone is started.  []  Diuretics: increased risk of cardiotoxicity if hypokalemia occurs.  []  Oral hypoglycemic agents (glyburide, glipizide, glimepiride): increased risk of hypoglycemia. Patient's glucose levels should be monitored closely when initiating amiodarone therapy.   [x]  Drugs that prolong the QT interval:  Torsades de pointes risk may be  increased with concurrent use - avoid if possible.  Monitor QTc, also keep magnesium/potassium WNL if concurrent therapy can't be avoided. Marland Kitchen. Antibiotics: e.g. fluoroquinolones, erythromycin. . Antiarrhythmics: e.g. quinidine, procainamide, disopyramide, sotalol. . Antipsychotics: e.g. phenothiazines, haloperidol.  . Lithium, tricyclic antidepressants, and methadone. Thank You,  Abran DukeLedford, Briley Sulton  02/12/2017 12:39 AM

## 2017-02-12 NOTE — Progress Notes (Signed)
  Progress Note    02/12/2017 9:41 AM 3 Days Post-Op  Subjective:  Having abdominal pain, thinks she has passed flatus  Vitals:   02/12/17 0800 02/12/17 0837  BP: 98/82   Pulse: 81   Resp: (!) 21   Temp: 98.5 F (36.9 C) 98 F (36.7 C)  SpO2: 98%     Physical Exam: aaox3 Abdominal incision cdi Groins with wound vacs cdi Feet are warm, strong dp/pt signals  CBC    Component Value Date/Time   WBC 15.7 (H) 02/12/2017 0322   RBC 2.57 (L) 02/12/2017 0322   HGB 7.7 (L) 02/12/2017 0322   HCT 22.8 (L) 02/12/2017 0322   PLT 136 (L) 02/12/2017 0322   MCV 88.7 02/12/2017 0322   MCH 30.0 02/12/2017 0322   MCHC 33.8 02/12/2017 0322   RDW 13.8 02/12/2017 0322   LYMPHSABS 2.9 01/02/2010 1100   MONOABS 0.6 01/02/2010 1100   EOSABS 0.2 01/02/2010 1100   BASOSABS 0.0 01/02/2010 1100    BMET    Component Value Date/Time   NA 133 (L) 02/12/2017 0322   K 3.7 02/12/2017 0322   CL 100 (L) 02/12/2017 0322   CO2 26 02/12/2017 0322   GLUCOSE 188 (H) 02/12/2017 0322   BUN 13 02/12/2017 0322   CREATININE 1.04 (H) 02/12/2017 0322   CALCIUM 7.5 (L) 02/12/2017 0322   GFRNONAA 52 (L) 02/12/2017 0322   GFRAA >60 02/12/2017 0322    INR    Component Value Date/Time   INR 1.32 02/09/2017 1530     Intake/Output Summary (Last 24 hours) at 02/12/17 0941 Last data filed at 02/12/17 0800  Gross per 24 hour  Intake           1818.8 ml  Output             2210 ml  Net           -391.2 ml     Assessment:  71 y.o. female is s/p AoBF  Plan: Neuro: continue toradol for pain control, will add low dose morphine Pulm: cxr with increased edema today, oob and incentive spirometry CV: regular rate this a.m but having pac's, will get Cardiology involved (Dr. Wyline MoodBranch is her cardiologist) GI: clamp ng tube before 8a, will re-evaluate again in afternoon FEN: minimize ivf, lasix 40 x 1 PPx: lovenox  Racer Quam C. Randie Heinzain, MD [facility] [place] Office:  [phone] Pager: [phone]  02/12/2017 9:41 AM

## 2017-02-12 NOTE — Progress Notes (Signed)
Dr. Randie Heinzain notified of pt's heart rhythm with pauses and Hgb/hct.  Orders received.  Will continue to monitor closely.

## 2017-02-12 NOTE — Progress Notes (Signed)
Dr. Randie Heinzain notified of pts heart rhythm.  Orders received.  Will continue to closely monitor.

## 2017-02-12 NOTE — Progress Notes (Signed)
  Echocardiogram 2D Echocardiogram with Definity has been performed.  Nolon RodBrown, Tony 02/12/2017, 2:57 PM

## 2017-02-12 NOTE — Consult Note (Signed)
Cardiology Consultation:   Patient ID: Charlotte Salas; [id number]; [birth year]   Admit date: 02/09/2017 Date of Consult: 02/12/2017  Primary Care Provider: Lorelei Pont, DO Primary Cardiologist: Dr Wyline Mood [facility])   Patient Profile:   Charlotte Salas is a 71 y.o. female with a hx of CAD who is being seen today for the evaluation of PAF at the request of Dr Myra Gianotti.  History of Present Illness:   [name] is a pleasant 71 y/o female who has a history of CAD, s/p RCA BMS in 2001, LAD BMS 2009 (RCA was occluded then with collaterals). Myoview in 2013 showed ischemia in the RCA distribution. Echo in 2017 showed her EF to be 35-40%. She reports she had a a stress test in [place] in Oct 2017 but we don't have those results.   She has had problems with leg pain and had venous ablation in [place]. She continued to have pain and saw Dr Myra Gianotti who found her ABI's to be abnormal. She underwent PV angiogram and finally AOBF 02/09/17. She was recovering slowly but without significant complication till early this AM when she went into AF with RVR. Her baseline rhythm is NSR with frequent PVCs. She was given IV Lopressor without significant effect and then started on IV Amiodarone. She developed pauses when trying to go back into NSR and her dose was cut to 30 mg, and again to 15 mg secondary to frequent pauses going in and out of AF. Currently she is stable, in NSR 70 with PVCs.    Past Medical History:  Diagnosis Date  . Atrial septal defect    hx of it  . CAD (coronary artery disease)    s/p prior stenting of the right coronary artery in '01 and non-ST-elevation myocardial infarction in jan '09, with a new bare-metal stent placed in the ostium of the left anterior descending coronary artert and the coronary anatomy as described above with total occlusion of the previously placed right coronary artery stent. EF 35-40%  . CHF (congestive heart failure) (HCC)   . Diabetes mellitus without complication  (HCC)   . Hepatitis 1971   " A"  . History of cervical cancer   . History of non-Hodgkin's lymphoma   . HTN (hypertension)   . HTN (hypertension)   . Hyperlipidemia   . Ischemic cardiomyopathy    EF 35-40% by 01/2016 echo  . Myocardial infarction (HCC) 2009, 2000  . Other and unspecified hyperlipidemia   . Peripheral vascular disease (HCC)   . Personal history of malignant neoplasm of cervix uteri   . Personal history of other lymphatic and hematopoietic neoplasm 2002   hodgkins lymphoma    Past Surgical History:  Procedure Laterality Date  . ABDOMINAL AORTOGRAM N/A 01/11/2017   Procedure: Abdominal Aortogram;  Surgeon: Nada Libman, MD;  Location: [facility];  Service: Cardiovascular;  Laterality: N/A;  . ANGIOPLASTY Left 02/09/2017   Procedure: LEFT PROFUNDA  FEMORAL ARTERY ANGIOPLASTY USING XENOSURE BIOLOGIC PATCH;  Surgeon: Nada Libman, MD;  Location: [facility] OR;  Service: Vascular;  Laterality: Left;  . AORTA - BILATERAL FEMORAL ARTERY BYPASS GRAFT N/A 02/09/2017   Procedure: AORTA BIFEMORAL BYPASS GRAFT USING HEMASHIELD GOLD;  Surgeon: Nada Libman, MD;  Location: [facility] OR;  Service: Vascular;  Laterality: N/A;  . ASD AND VSD REPAIR    . LOWER EXTREMITY ANGIOGRAPHY Bilateral 01/11/2017   Procedure: Lower Extremity Angiography;  Surgeon: Nada Libman, MD;  Location: [facility];  Service: Cardiovascular;  Laterality: Bilateral;  . VEIN SURGERY Bilateral Jan. Feb. and Mar. 2018   Dr. Earna Coder (heart [facility] vein [facility] in [place]     Home Medications:  Prior to Admission medications   Medication Sig Start Date End Date Taking? Authorizing Provider  aspirin EC 81 MG tablet Take 81 mg by mouth daily.   Yes [provider]  B Complex Vitamins (B-COMPLEX/B-12 PO) Take 1 tablet by mouth daily.    Yes [provider]  carvedilol (COREG) 12.5 MG tablet TAKE 1 TABLET (12.5 MG TOTAL) BY MOUTH 2 (TWO) TIMES DAILY. 01/26/17  Yes Antoine Poche,  MD  Cranberry 1000 MG CAPS Take 1 capsule by mouth daily.    Yes [provider]  lisinopril (PRINIVIL,ZESTRIL) 10 MG tablet Take 1 tablet (10 mg total) by mouth daily. 01/26/17  Yes Branch, Dorothe Pea, MD  metFORMIN (GLUCOPHAGE) 500 MG tablet Take 500 mg by mouth daily with breakfast.    Yes [provider]  nitroGLYCERIN (NITROSTAT) 0.4 MG SL tablet Place 1 tablet (0.4 mg total) under the tongue every 5 (five) minutes as needed for chest pain (for chest pain). 10/17/15  Yes Kathleene Hazel, MD  omega-3 acid ethyl esters (LOVAZA) 1 g capsule Take by mouth 2 (two) times daily.   Yes [provider]  pregabalin (LYRICA) 75 MG capsule Take 75 mg by mouth 2 (two) times daily.   Yes [provider]  simvastatin (ZOCOR) 20 MG tablet Take 1 tablet (20 mg total) by mouth daily. 01/26/17  Yes Branch, Dorothe Pea, MD  traMADol (ULTRAM) 50 MG tablet Take 50 mg by mouth every 6 (six) hours as needed for moderate pain or severe pain.    Yes [provider]    Inpatient Medications: Scheduled Meds: . chlorhexidine  15 mL Mouth Rinse BID  . Chlorhexidine Gluconate Cloth  6 each Topical Daily  . enoxaparin (LOVENOX) injection  30 mg Subcutaneous Q12H  . furosemide  40 mg Intravenous Once  . insulin aspart  0-15 Units Subcutaneous TID WC  . mouth rinse  15 mL Mouth Rinse q12n4p  . metoprolol tartrate  2.5 mg Intravenous Q6H  . pantoprazole  40 mg Oral Daily  . pantoprazole (PROTONIX) IV  40 mg Intravenous QHS  . sodium chloride flush  10-40 mL Intracatheter Q12H   Continuous Infusions: . amiodarone 30 mg/hr (02/12/17 0631)  . dextrose 5 % and 0.45% NaCl Stopped (02/12/17 0530)  . phenylephrine (NEO-SYNEPHRINE) Adult infusion Stopped (02/12/17 0500)   PRN Meds: acetaminophen **OR** acetaminophen, bisacodyl, guaiFENesin-dextromethorphan, hydrALAZINE, ketorolac, labetalol, metoprolol tartrate, morphine injection, ondansetron, phenol, potassium chloride,  sodium chloride flush  Allergies:    Allergies  Allergen Reactions  . Codeine Nausea Only    Social History:   Social History   Social History  . Marital status: Divorced    Spouse name: N/A  . Number of children: N/A  . Years of education: N/A   Occupational History  . Not on file.   Social History Main Topics  . Smoking status: Current Every Day Smoker    Packs/day: 0.25    Years: 50.00    Types: Cigarettes, E-cigarettes  . Smokeless tobacco: Never Used     Comment: 1 ppd  . Alcohol use No  . Drug use: No  . Sexual activity: Not on file   Other Topics Concern  . Not on file   Social History Narrative   Lives in [place], [place] alone.   [employer]acer  study.     Family History:    Family History  Problem Relation Age of Onset  . Leukemia Father   . Heart Problems Mother   . Heart attack Sister      ROS:  Please see the history of present illness.  ROS  No recent chest pain All other ROS reviewed and negative.     Physical Exam/Data:   Vitals:   02/12/17 0600 02/12/17 0615 02/12/17 0800 02/12/17 0837  BP: (!) 100/44  98/82   Pulse: 74 71 81   Resp: 16 15 (!) 21   Temp:   98.5 F (36.9 C) 98 F (36.7 C)  TempSrc:   Oral Oral  SpO2: 99% 99% 98%   Weight:      Height:        Intake/Output Summary (Last 24 hours) at 02/12/17 0944 Last data filed at 02/12/17 0800  Gross per 24 hour  Intake           1818.8 ml  Output             2210 ml  Net           -391.2 ml   Filed Weights   02/09/17 0558 02/12/17 0530  Weight: 136 lb (61.7 kg) 142 lb 3.2 oz (64.5 kg)   Body mass index is 28.24 kg/m.  General:  71 y/o female (looks older), pale, NG in place, on O2, in no acute distress HEENT: normal Lymph: no adenopathy Neck: no JVD Endocrine:  No thryomegaly Cardiac:  normal S1, S2; RRR; no murmur  Lungs:  Decreased breath sounds bilaterally, no wheezing, rhonchi or rales  Abd: soft, mid line surgical scar, wound vac Rt grion Ext: no  edema Musculoskeletal:  No deformities, BUE and BLE strength normal and equal Skin: pale, cool, and dry  Neuro:  CNs 2-12 intact, no focal abnormalities noted Psych:  Normal affect   EKG:  The EKG today is pending Telemetry:  Telemetry was personally reviewed and demonstrates:  PAF with RVR with conversion pauses and PVCs  Relevant CV Studies: Echo 01/18/17- Study Conclusions  - Left ventricle: The cavity size was mildly dilated. There was   mild concentric hypertrophy. Systolic function was moderately   reduced. The estimated ejection fraction was in the range of 35%   to 40%. Diffuse hypokinesis. Doppler parameters are consistent   with abnormal left ventricular relaxation (grade 1 diastolic   dysfunction). Doppler parameters are consistent with high   ventricular filling pressure. - Aortic valve: Transvalvular velocity was within the normal range.   There was no stenosis. There was no regurgitation. - Mitral valve: Transvalvular velocity was within the normal range.   There was no evidence for stenosis. There was trivial   regurgitation. - Left atrium: The atrium was mildly dilated. - Right ventricle: The cavity size was normal. Wall thickness was   normal. Systolic function was mildly reduced. - Tricuspid valve: There was mild regurgitation. - Pulmonary arteries: Systolic pressure was within the normal   range. PA peak pressure: 32 mm Hg (S).   Laboratory Data:  Chemistry Recent Labs Lab 02/11/17 0438 02/11/17 1311 02/12/17 0322  NA 133* 134* 133*  K 4.3 4.0 3.7  CL 105 103 100*  CO2 24 25 26   GLUCOSE 171* 155* 188*  BUN 12 12 13   CREATININE 0.98 1.00 1.04*  CALCIUM 7.4* 7.6* 7.5*  GFRNONAA 56* 55* 52*  GFRAA >60 >60 >60  ANIONGAP 4* 6 7  Recent Labs Lab 02/07/17 1154 02/10/17 0509 02/11/17 1311  PROT 7.0 4.6* 5.1*  ALBUMIN 4.0 2.6* 2.4*  AST 21 52* 63*  ALT 13* 14 30  ALKPHOS 57 37* 53  BILITOT 0.8 0.8 0.9   Hematology Recent Labs Lab  02/11/17 0438 02/11/17 1311 02/12/17 0322  WBC 21.3* 22.2* 15.7*  RBC 3.07* 2.98* 2.57*  HGB 8.8* 8.8* 7.7*  HCT 28.0* 27.0* 22.8*  MCV 91.2 90.6 88.7  MCH 28.7 29.5 30.0  MCHC 31.4 32.6 33.8  RDW 14.4 14.0 13.8  PLT 169 157 136*   Cardiac EnzymesNo results for input(s): TROPONINI in the last 168 hours. No results for input(s): TROPIPOC in the last 168 hours.  BNPNo results for input(s): BNP, PROBNP in the last 168 hours.  DDimer No results for input(s): DDIMER in the last 168 hours.  Radiology/Studies:  Dg Chest Port 1 View  Result Date: 02/11/2017 CLINICAL DATA:  Decreased O2 saturation, abdominal pain EXAM: PORTABLE CHEST 1 VIEW COMPARISON:  02/10/2017 FINDINGS: Bilateral interstitial thickening. Right lower lobe airspace disease. Small left pleural effusion. Left basilar airspace disease. No pneumothorax. Stable cardiomegaly. Right jugular central venous catheter with the tip projecting over the SVC. Nasogastric tube coursing below the diaphragm. IMPRESSION: 1. Cardiomegaly with mild pulmonary vascular congestion. 2. Bibasilar airspace disease which may reflect atelectasis versus pneumonia. Electronically Signed   By: Elige Ko   On: 02/11/2017 08:37   Dg Chest Port 1 View  Result Date: 02/10/2017 CLINICAL DATA:  Status post aorto bi femoral bypass. History of coronary artery disease, current smoker. EXAM: PORTABLE CHEST 1 VIEW COMPARISON:  Portable chest x-ray of February 09, 2017 FINDINGS: The lungs are well-expanded. There is persistent increased retrocardiac density with small left pleural effusion. There is slightly more conspicuous right basilar density. The cardiac silhouette remains enlarged. The pulmonary vascularity is less engorged. There is calcification in the wall of the thoracic aorta. The esophagogastric tube tip and proximal port project below the inferior margin of the image. The right internal jugular venous catheter tip projects over the midportion of the SVC.  IMPRESSION: Persistent bibasilar atelectasis or pneumonia. Small left pleural effusion, stable. Mild cardiomegaly with decreased pulmonary vascular congestion. Thoracic aortic atherosclerosis. Electronically Signed   By: David  [place] M.D.   On: 02/10/2017 07:34   Dg Chest Port 1 View  Result Date: 02/09/2017 CLINICAL DATA:  Vascular surgery. EXAM: PORTABLE CHEST 1 VIEW COMPARISON:  CT 01/24/2017 FINDINGS: Central venous line with tip in distal SVC. NG tube extends into the stomach. Normal cardiac silhouette. Mild LEFT basilar atelectasis small effusion. No pulmonary edema. No pneumothorax. IMPRESSION: LEFT basilar atelectasis and small effusion. No pulmonary edema or pneumothorax. Electronically Signed   By: Genevive Bi M.D.   On: 02/09/2017 15:39   Dg Abd Portable 1v  Result Date: 02/09/2017 CLINICAL DATA:  Status post aortobifemoral bypass EXAM: PORTABLE ABDOMEN - 1 VIEW COMPARISON:  None. FINDINGS: Scattered large and small bowel gas is noted. Diffuse vascular calcifications are seen. A nasogastric catheter is noted within the stomach. No acute bony abnormality is seen. IMPRESSION: No acute abnormality noted. Electronically Signed   By: Alcide Clever M.D.   On: 02/09/2017 15:37    Assessment and Plan:   PAF with RVR and conversion pauses- Apparently a new problem for her. Periods of AF were breif. Currently in NSR on low dose (15mg )  IV Amiodarone  PVD- S/p AOBF and endarterectomy 02/09/17  CAD- H/o prior PCI- last stress was Oct 2017 in [place].  No angina currently  ICM- Her last EF was 35-40% by echo. She does not appear to be in CHF on exam  HTN- On ACE and Coreg as an OP- currently NPO  Plan: Continue current IV Amiodarone dose. Keep K+ closer to 4.0. Check EKG this am. MD to see.    Signed, Corine ShelterLuke Kilroy, PA-C  02/12/2017 9:44 AM   As above, patient seen and examined. Briefly she is a 71 year old female with past medical history of coronary artery disease with prior PCI,  diabetes mellitus, hypertension, hyperlipidemia, ischemic cardiomyopathy, peripheral vascular disease and now status post aortic bifemoral bypass grafting for evaluation of postoperative atrial flutter and bradycardia. Patient underwent surgery on August 29. She has had slow recovery. She developed atrial flutter with rapid ventricular response. She was given IV Lopressor and started on IV amiodarone and developed posttermination pauses and bradycardia with heart rates occasionally in the 30s and pulse of 3.6 seconds maximum. Cardiology now asked to evaluate. She denies dyspnea, chest pain, palpitations or near syncope. Electrocardiogram shows atrial flutter with left bundle branch block.  1 postoperative atrial fibrillation/flutter-patient is having intermittent atrial arrhythmias. Her heart rate is elevated but with a combination of amiodarone and metoprolol she developed heart rates in the 30s when in sinus as well as pauses maximum length 3.6 seconds. I will discontinue her metoprolol. Instead we will try IV amiodarone alone to help maintain sinus rhythm. Follow on telemetry closely. Repeat echocardiogram. CHADSvasc 6. However this appears to be postoperative atrial arrhythmias and therefore I do not think she will require long-term anticoagulation unless arrhythmias persist. We would also need to discuss with vascular surgery when appropriate timing would be postoperatively. We'll plan to repeat echocardiogram.   2 peripheral vascular disease status post aortobifemoral bypass-management per vascular surgery.  3 coronary artery disease-resume aspirin and statin at discharge.  4 ischemic cardiomyopathy-resume carvedilol and ACE inhibitor when patient taking oral medications and BP allows.  Olga MillersBrian Crenshaw, MD

## 2017-02-13 DIAGNOSIS — I480 Paroxysmal atrial fibrillation: Secondary | ICD-10-CM

## 2017-02-13 LAB — BASIC METABOLIC PANEL
ANION GAP: 10 (ref 5–15)
Anion gap: 9 (ref 5–15)
BUN: 26 mg/dL — AB (ref 6–20)
BUN: 34 mg/dL — ABNORMAL HIGH (ref 6–20)
CALCIUM: 7.7 mg/dL — AB (ref 8.9–10.3)
CO2: 24 mmol/L (ref 22–32)
CO2: 22 mmol/L (ref 22–32)
CREATININE: 1.46 mg/dL — AB (ref 0.44–1.00)
CREATININE: 1.77 mg/dL — AB (ref 0.44–1.00)
Calcium: 7.7 mg/dL — ABNORMAL LOW (ref 8.9–10.3)
Chloride: 102 mmol/L (ref 101–111)
Chloride: 96 mmol/L — ABNORMAL LOW (ref 101–111)
GFR calc non Af Amer: 34 mL/min — ABNORMAL LOW (ref >60)
GFR, EST AFRICAN AMERICAN: 40 mL/min — AB (ref >60)
GFR, EST AFRICAN AMERICAN: 32 mL/min — AB (ref >60)
GFR, EST NON AFRICAN AMERICAN: 27 mL/min — AB (ref >60)
GLUCOSE: 196 mg/dL — AB (ref 65–99)
Glucose, Bld: 161 mg/dL — ABNORMAL HIGH (ref 65–99)
Potassium: 4.5 mmol/L (ref 3.5–5.1)
Potassium: 4.8 mmol/L (ref 3.5–5.1)
SODIUM: 135 mmol/L (ref 135–145)
Sodium: 128 mmol/L — ABNORMAL LOW (ref 135–145)

## 2017-02-13 LAB — CBC WITH DIFFERENTIAL/PLATELET
BASOS ABS: 0 10*3/uL (ref 0.0–0.1)
BASOS PCT: 0 %
EOS ABS: 0 10*3/uL (ref 0.0–0.7)
Eosinophils Relative: 0 %
HEMATOCRIT: 32.6 % — AB (ref 36.0–46.0)
Hemoglobin: 10.6 g/dL — ABNORMAL LOW (ref 12.0–15.0)
Lymphocytes Relative: 14 %
Lymphs Abs: 2 10*3/uL (ref 0.7–4.0)
MCH: 28.6 pg (ref 26.0–34.0)
MCHC: 32.5 g/dL (ref 30.0–36.0)
MCV: 88.1 fL (ref 78.0–100.0)
MONO ABS: 1.3 10*3/uL — AB (ref 0.1–1.0)
MONOS PCT: 9 %
NEUTROS ABS: 11 10*3/uL — AB (ref 1.7–7.7)
Neutrophils Relative %: 77 %
Platelets: 205 10*3/uL (ref 150–400)
RBC: 3.7 MIL/uL — ABNORMAL LOW (ref 3.87–5.11)
RDW: 15.5 % (ref 11.5–15.5)
WBC: 14.3 10*3/uL — ABNORMAL HIGH (ref 4.0–10.5)

## 2017-02-13 LAB — GLUCOSE, CAPILLARY
GLUCOSE-CAPILLARY: 193 mg/dL — AB (ref 65–99)
GLUCOSE-CAPILLARY: 198 mg/dL — AB (ref 65–99)
Glucose-Capillary: 174 mg/dL — ABNORMAL HIGH (ref 65–99)
Glucose-Capillary: 189 mg/dL — ABNORMAL HIGH (ref 65–99)
Glucose-Capillary: 180 mg/dL — ABNORMAL HIGH (ref 65–99)

## 2017-02-13 LAB — TYPE AND SCREEN
ABO/RH(D): A NEG
ANTIBODY SCREEN: NEGATIVE
UNIT DIVISION: 0
UNIT DIVISION: 0

## 2017-02-13 LAB — CBC
HCT: 31.5 % — ABNORMAL LOW (ref 36.0–46.0)
Hemoglobin: 10.6 g/dL — ABNORMAL LOW (ref 12.0–15.0)
MCH: 28.9 pg (ref 26.0–34.0)
MCHC: 33.7 g/dL (ref 30.0–36.0)
MCV: 85.8 fL (ref 78.0–100.0)
PLATELETS: 165 10*3/uL (ref 150–400)
RBC: 3.67 MIL/uL — ABNORMAL LOW (ref 3.87–5.11)
RDW: 14.9 % (ref 11.5–15.5)
WBC: 13.6 10*3/uL — AB (ref 4.0–10.5)

## 2017-02-13 LAB — BPAM RBC
BLOOD PRODUCT EXPIRATION DATE: 201809122359
Blood Product Expiration Date: 201809132359
ISSUE DATE / TIME: 201809010500
ISSUE DATE / TIME: 201809010914
UNIT TYPE AND RH: 600
UNIT TYPE AND RH: 600

## 2017-02-13 MED ORDER — ENOXAPARIN SODIUM 80 MG/0.8ML ~~LOC~~ SOLN
70.0000 mg | Freq: Two times a day (BID) | SUBCUTANEOUS | Status: DC
  Administered 2017-02-13 – 2017-02-14 (×2): 70 mg via SUBCUTANEOUS
  Filled 2017-02-13 (×3): qty 0.8

## 2017-02-13 MED ORDER — SODIUM CHLORIDE 0.9 % IV BOLUS (SEPSIS)
500.0000 mL | Freq: Once | INTRAVENOUS | Status: AC
  Administered 2017-02-14: 500 mL via INTRAVENOUS

## 2017-02-13 MED ORDER — FUROSEMIDE 10 MG/ML IJ SOLN
20.0000 mg | Freq: Once | INTRAMUSCULAR | Status: AC
  Administered 2017-02-13: 20 mg via INTRAVENOUS
  Filled 2017-02-13: qty 2

## 2017-02-13 MED ORDER — SODIUM CHLORIDE 0.9 % IV SOLN
INTRAVENOUS | Status: DC
  Administered 2017-02-13: 1 mL via INTRAVENOUS

## 2017-02-13 MED ORDER — ENOXAPARIN SODIUM 40 MG/0.4ML ~~LOC~~ SOLN
40.0000 mg | SUBCUTANEOUS | Status: AC
  Administered 2017-02-13: 40 mg via SUBCUTANEOUS
  Filled 2017-02-13: qty 0.4

## 2017-02-13 MED ORDER — OXYCODONE-ACETAMINOPHEN 5-325 MG PO TABS
2.0000 | ORAL_TABLET | ORAL | Status: DC | PRN
Start: 2017-02-13 — End: 2017-02-19
  Administered 2017-02-13: 2 via ORAL
  Administered 2017-02-14 (×2): 1 via ORAL
  Administered 2017-02-14 – 2017-02-19 (×20): 2 via ORAL
  Filled 2017-02-13 (×23): qty 2

## 2017-02-13 NOTE — Progress Notes (Signed)
Dr. Randie Heinzain notified of pt's urine output.  Orders received.  Will continue to monitor closely.

## 2017-02-13 NOTE — Progress Notes (Signed)
  Progress Note    02/13/2017 7:45 AM 4 Days Post-Op  Subjective:  Feeling much better, wants to eat  Vitals:   02/13/17 0530 02/13/17 0600  BP: (!) 118/50 122/68  Pulse: (!) 111 (!) 112  Resp: 20 18  Temp:    SpO2: (!) 89% 94%    Physical Exam: aaox3 Abdomen is soft with incision cdi Bilateral groins with wound vac Strong signals in dp/pt bilaterally  CBC    Component Value Date/Time   WBC 13.6 (H) 02/13/2017 0523   RBC 3.67 (L) 02/13/2017 0523   HGB 10.6 (L) 02/13/2017 0523   HCT 31.5 (L) 02/13/2017 0523   PLT 165 02/13/2017 0523   MCV 85.8 02/13/2017 0523   MCH 28.9 02/13/2017 0523   MCHC 33.7 02/13/2017 0523   RDW 14.9 02/13/2017 0523   LYMPHSABS 2.9 01/02/2010 1100   MONOABS 0.6 01/02/2010 1100   EOSABS 0.2 01/02/2010 1100   BASOSABS 0.0 01/02/2010 1100    BMET    Component Value Date/Time   NA 135 02/13/2017 0523   K 4.5 02/13/2017 0523   CL 102 02/13/2017 0523   CO2 24 02/13/2017 0523   GLUCOSE 161 (H) 02/13/2017 0523   BUN 26 (H) 02/13/2017 0523   CREATININE 1.46 (H) 02/13/2017 0523   CALCIUM 7.7 (L) 02/13/2017 0523   GFRNONAA 34 (L) 02/13/2017 0523   GFRAA 40 (L) 02/13/2017 0523    INR    Component Value Date/Time   INR 1.32 02/09/2017 1530     Intake/Output Summary (Last 24 hours) at 02/13/17 0745 Last data filed at 02/13/17 0600  Gross per 24 hour  Intake          1146.08 ml  Output              500 ml  Net           646.08 ml     Assessment:  71 y.o. female is s/p AoBF  Plan: Neuro: low dose morphine pain control, holding toradol Pulm: oob, incentive spirometry CV: cardiology following for atrial flutter on amio, started therapeutic lovenox, will need eliquis prior to discharge GI: clear liquids today FEN: ivf 30, hold toradol, foley until renal function stablizes PPx: Tx lovenox   Brandon C. Randie Heinzain, MD [facility] Office: [phone] Pager: [phone]  02/13/2017 7:45 AM

## 2017-02-13 NOTE — Progress Notes (Signed)
Pt noted to be profusely sweating.  Sitting in chair c/o back pain.  SB noted on bedside ekg.  A/O.  Bilateral pedal pulses palpable.  CBG 198.  Dr Randie Heinzain notified of events.  Orders received.  Labs drawn.  Will continue to monitor closely.

## 2017-02-13 NOTE — Progress Notes (Addendum)
Progress Note  Patient Name: Charlotte Salas Date of Encounter: 02/13/2017  Primary Cardiologist: Dr Wyline Mood  Subjective   No chest pain or dyspnea; no palpitations; complains of abdominal pain  Inpatient Medications    Scheduled Meds: . chlorhexidine  15 mL Mouth Rinse BID  . Chlorhexidine Gluconate Cloth  6 each Topical Daily  . enoxaparin (LOVENOX) injection  30 mg Subcutaneous Q12H  . insulin aspart  0-15 Units Subcutaneous TID WC  . mouth rinse  15 mL Mouth Rinse q12n4p  . pantoprazole  40 mg Oral Daily  . pantoprazole (PROTONIX) IV  40 mg Intravenous QHS  . sodium chloride flush  10-40 mL Intracatheter Q12H   Continuous Infusions: . sodium chloride    . amiodarone 30 mg/hr (02/13/17 0300)  . dextrose 5 % and 0.45% NaCl Stopped (02/12/17 0530)  . phenylephrine (NEO-SYNEPHRINE) Adult infusion Stopped (02/12/17 0500)   PRN Meds: acetaminophen **OR** acetaminophen, bisacodyl, guaiFENesin-dextromethorphan, hydrALAZINE, morphine injection, ondansetron, phenol, potassium chloride, sodium chloride flush   Vital Signs    Vitals:   02/13/17 0430 02/13/17 0500 02/13/17 0530 02/13/17 0600  BP: 111/60 (!) 108/54 (!) 118/50 122/68  Pulse: (!) 113 (!) 115 (!) 111 (!) 112  Resp: 14 14 20 18   Temp:      TempSrc:      SpO2: 95% 95% (!) 89% 94%  Weight:    70.5 kg (155 lb 6.8 oz)  Height:        Intake/Output Summary (Last 24 hours) at 02/13/17 0802 Last data filed at 02/13/17 0600  Gross per 24 hour  Intake           792.78 ml  Output              500 ml  Net           292.78 ml   Filed Weights   02/09/17 0558 02/12/17 0530 02/13/17 0600  Weight: 61.7 kg (136 lb) 64.5 kg (142 lb 3.2 oz) 70.5 kg (155 lb 6.8 oz)    Telemetry    Atrial fibrillation- Personally Reviewed   Physical Exam   GEN: No acute distress.   Neck: supple Cardiac: irregular, mildly tachycardic Respiratory: CTA anteriorly GI: S/P abdominal surgery; soft MS: No edema Neuro:  Nonfocal  Psych:  Normal affect   Labs    Chemistry Recent Labs Lab 02/07/17 1154  02/10/17 0509  02/11/17 1311 02/12/17 0322 02/13/17 0523  NA 138  < > 136  < > 134* 133* 135  K 4.7  < > 5.2*  < > 4.0 3.7 4.5  CL 109  < > 110  < > 103 100* 102  CO2 20*  < > 22  < > 25 26 24   GLUCOSE 106*  < > 196*  < > 155* 188* 161*  BUN 20  < > 20  < > 12 13 26*  CREATININE 0.96  < > 1.38*  < > 1.00 1.04* 1.46*  CALCIUM 9.2  < > 7.4*  < > 7.6* 7.5* 7.7*  PROT 7.0  --  4.6*  --  5.1*  --   --   ALBUMIN 4.0  --  2.6*  --  2.4*  --   --   AST 21  --  52*  --  63*  --   --   ALT 13*  --  14  --  30  --   --   ALKPHOS 57  --  37*  --  53  --   --  BILITOT 0.8  --  0.8  --  0.9  --   --   GFRNONAA 57*  < > 37*  < > 55* 52* 34*  GFRAA >60  < > 43*  < > >60 >60 40*  ANIONGAP 9  < > 4*  < > 6 7 9   < > = values in this interval not displayed.   Hematology Recent Labs Lab 02/12/17 0322 02/12/17 1252 02/13/17 0523  WBC 15.7* 16.0* 13.6*  RBC 2.57* 3.66* 3.67*  HGB 7.7* 10.5* 10.6*  HCT 22.8* 31.4* 31.5*  MCV 88.7 85.8 85.8  MCH 30.0 28.7 28.9  MCHC 33.8 33.4 33.7  RDW 13.8 14.3 14.9  PLT 136* 142* 165      Radiology    Dg Chest Port 1 View  Result Date: 02/11/2017 CLINICAL DATA:  Decreased O2 saturation, abdominal pain EXAM: PORTABLE CHEST 1 VIEW COMPARISON:  02/10/2017 FINDINGS: Bilateral interstitial thickening. Right lower lobe airspace disease. Small left pleural effusion. Left basilar airspace disease. No pneumothorax. Stable cardiomegaly. Right jugular central venous catheter with the tip projecting over the SVC. Nasogastric tube coursing below the diaphragm. IMPRESSION: 1. Cardiomegaly with mild pulmonary vascular congestion. 2. Bibasilar airspace disease which may reflect atelectasis versus pneumonia. Electronically Signed   By: Elige KoHetal  Patel   On: 02/11/2017 08:37   Dg Chest Port 1v Same Day  Result Date: 02/12/2017 CLINICAL DATA:  Fluid overload. EXAM: PORTABLE CHEST 1 VIEW COMPARISON:   One-view chest x-ray 02/11/2017 FINDINGS: The heart is enlarged. Atherosclerotic calcifications are present at the aortic arch. Interstitial edema has increased slightly. Retrocardiac opacification is again noted. Bilateral pleural effusions are present. IMPRESSION: 1. Cardiomegaly with slight increase in interstitial edema. 2. Bilateral pleural effusions. 3. Retrocardiac opacification likely reflects atelectasis. Infection is not excluded. Electronically Signed   By: Marin Robertshristopher  Mattern M.D.   On: 02/12/2017 10:04    Patient Profile     71 year old female with past medical history of coronary artery disease with prior PCI, diabetes mellitus, hypertension, hyperlipidemia, ischemic cardiomyopathy, peripheral vascular disease and now status post aortic bifemoral bypass grafting for evaluation of postoperative atrial flutter and bradycardia. Patient underwent surgery on August 29. She has had slow recovery. She developed atrial flutter with rapid ventricular response. She was given IV Lopressor and started on IV amiodarone and developed posttermination pauses and bradycardia with heart rates occasionally in the 30s and pauses of 3.6 seconds maximum. Echocardiogram shows ejection fraction 30-35%, biatrial enlargement, mild right ventricular enlargement and moderate mitral regurgitation. Ejection fraction is mildly decreased compared to previous (35-40 8/17)  Assessment & Plan    1 postoperative atrial fibrillation/flutter-Patient remains in atrial fibrillation this morning. She has not had bradycardia since yesterday at approximately 11 AM. I will continue IV amiodarone and hopefully she will convert to sinus. Follow telemetry closely. This is likely postoperative atrial arrhythmia and I would plan to continue amiodarone for 8 weeks following discharge. If she holds sinus rhythm could discontinue at that point. Given persistent nature of atrial fibrillation I would like to begin anticoagulation. CHADSvasc 6.  Would treat with apixaban 5 mg BID when ok with vascular surgery. Given this is postoperative atrial arrhythmia I do not think she will require long-term anticoagulation. Would continue for approximately 8 weeks and discontinue if no further atrial fibrillation.   2 peripheral vascular disease status post aortobifemoral bypass-management per vascular surgery.  3 coronary artery disease-resume aspirin and statin at discharge.  4 ischemic cardiomyopathy-LV function slightly worse compared to previous; resume  carvedilol and ACE inhibitor when patient taking oral medications and BP allows.FU with Dr Marcina Millard following DC. Titrate medications as an outpatient. Repeat echocardiogram 3 months later. If ejection fraction remains less than 35% would need to consider ICD. Patient mildly volume overloaded with edema noted on chest x-ray. Will give Lasix 20 mg IV 1.   Signed, Olga Millers, MD  02/13/2017, 8:02 AM

## 2017-02-13 NOTE — Progress Notes (Signed)
4 mg of Morphine taken from pyxis, wasted 2mg  with Jeanett SchleinMegan Baily RN in pyxis. However upon futher evaluation pt is too drowsy for morphine dose. Pharmacy was notified that RN could not return dose to pyxis. They recommended that RN walk unopen Morphine down to pharmacy. Jeanett SchleinMegan Baily Rn returned 4mg  dose to pharmacy.

## 2017-02-14 ENCOUNTER — Inpatient Hospital Stay (HOSPITAL_COMMUNITY): Payer: Medicare Other

## 2017-02-14 DIAGNOSIS — I5023 Acute on chronic systolic (congestive) heart failure: Secondary | ICD-10-CM

## 2017-02-14 LAB — COOXEMETRY PANEL
CARBOXYHEMOGLOBIN: 1.2 % (ref 0.5–1.5)
Carboxyhemoglobin: 1 % (ref 0.5–1.5)
METHEMOGLOBIN: 0.8 % (ref 0.0–1.5)
Methemoglobin: 0.8 % (ref 0.0–1.5)
O2 Saturation: 47.4 %
O2 Saturation: 61.7 %
TOTAL HEMOGLOBIN: 12 g/dL (ref 12.0–16.0)
Total hemoglobin: 10.7 g/dL — ABNORMAL LOW (ref 12.0–16.0)

## 2017-02-14 LAB — BRAIN NATRIURETIC PEPTIDE: B NATRIURETIC PEPTIDE 5: 1788.7 pg/mL — AB (ref 0.0–100.0)

## 2017-02-14 LAB — BASIC METABOLIC PANEL
Anion gap: 9 (ref 5–15)
BUN: 37 mg/dL — ABNORMAL HIGH (ref 6–20)
CHLORIDE: 100 mmol/L — AB (ref 101–111)
CO2: 24 mmol/L (ref 22–32)
Calcium: 7.6 mg/dL — ABNORMAL LOW (ref 8.9–10.3)
Creatinine, Ser: 1.78 mg/dL — ABNORMAL HIGH (ref 0.44–1.00)
GFR calc non Af Amer: 27 mL/min — ABNORMAL LOW (ref >60)
GFR, EST AFRICAN AMERICAN: 31 mL/min — AB (ref >60)
Glucose, Bld: 155 mg/dL — ABNORMAL HIGH (ref 65–99)
POTASSIUM: 4.1 mmol/L (ref 3.5–5.1)
SODIUM: 133 mmol/L — AB (ref 135–145)

## 2017-02-14 LAB — TROPONIN I
TROPONIN I: 2.19 ng/mL — AB (ref <0.03)
TROPONIN I: 2.03 ng/mL — AB (ref <0.03)
TROPONIN I: 1.9 ng/mL — AB (ref <0.03)

## 2017-02-14 LAB — GLUCOSE, CAPILLARY
GLUCOSE-CAPILLARY: 182 mg/dL — AB (ref 65–99)
GLUCOSE-CAPILLARY: 141 mg/dL — AB (ref 65–99)

## 2017-02-14 MED ORDER — ATORVASTATIN CALCIUM 10 MG PO TABS
10.0000 mg | ORAL_TABLET | Freq: Every day | ORAL | Status: DC
  Administered 2017-02-14 – 2017-02-18 (×5): 10 mg via ORAL
  Filled 2017-02-14 (×5): qty 1

## 2017-02-14 MED ORDER — MILRINONE LACTATE IN DEXTROSE 20-5 MG/100ML-% IV SOLN
0.0625 ug/kg/min | INTRAVENOUS | Status: DC
  Administered 2017-02-14: 0.0625 ug/kg/min via INTRAVENOUS
  Filled 2017-02-14: qty 100

## 2017-02-14 MED ORDER — AMIODARONE HCL 200 MG PO TABS
200.0000 mg | ORAL_TABLET | Freq: Two times a day (BID) | ORAL | Status: DC
  Administered 2017-02-14 – 2017-02-15 (×4): 200 mg via ORAL
  Filled 2017-02-14 (×4): qty 1

## 2017-02-14 MED ORDER — ORAL CARE MOUTH RINSE
15.0000 mL | Freq: Two times a day (BID) | OROMUCOSAL | Status: DC
  Administered 2017-02-14 – 2017-02-15 (×4): 15 mL via OROMUCOSAL

## 2017-02-14 MED ORDER — FUROSEMIDE 10 MG/ML IJ SOLN
60.0000 mg | Freq: Two times a day (BID) | INTRAMUSCULAR | Status: AC
  Administered 2017-02-14 (×2): 60 mg via INTRAVENOUS
  Filled 2017-02-14 (×2): qty 6

## 2017-02-14 MED ORDER — ENOXAPARIN SODIUM 80 MG/0.8ML ~~LOC~~ SOLN
65.0000 mg | SUBCUTANEOUS | Status: DC
  Administered 2017-02-15: 65 mg via SUBCUTANEOUS
  Filled 2017-02-14: qty 0.8

## 2017-02-14 MED ORDER — CHLORHEXIDINE GLUCONATE 0.12 % MT SOLN
15.0000 mL | Freq: Two times a day (BID) | OROMUCOSAL | Status: DC
  Administered 2017-02-14 – 2017-02-16 (×5): 15 mL via OROMUCOSAL
  Filled 2017-02-14 (×4): qty 15

## 2017-02-14 MED ORDER — ASPIRIN EC 81 MG PO TBEC
81.0000 mg | DELAYED_RELEASE_TABLET | Freq: Every day | ORAL | Status: DC
  Administered 2017-02-14 – 2017-02-19 (×6): 81 mg via ORAL
  Filled 2017-02-14 (×6): qty 1

## 2017-02-14 MED ORDER — SIMVASTATIN 20 MG PO TABS: 20.0000 mg | ORAL_TABLET | Freq: Every day | ORAL | Status: DC

## 2017-02-14 NOTE — Progress Notes (Signed)
Case reviewed for LOS; B Donovan Gatchel RN,MHA,BSN [phone] 

## 2017-02-14 NOTE — Progress Notes (Addendum)
Mixed venous O2 is 47 in setting of elevated CVP, poor urine output, volume overload, and worsening renal function. Last LVEF 30-35%.  We will start low dose milrinone, repeat Co-ox later today. Due to her poor renal function start milrinone 0.0625 mcg/kg/min, may titrate further pending her f/u co-ox.  Dina RichJonathan Branch MD   1220pm addendum Troponin elevated in setting of recent afib with RVR, congestive heart failure with volume overload, AKI, and recent surgery. Patient denies any chest pain. I suspect this is due to increase demand in setting of chronic obstructive CAD as opposed to acute obstructive disease. Her stress 2013 showed some inferior ischemia consistent with her known RCA disease that had collaterals by last cath. Echo 02/12/17 fairly stable LVEF, perhaps mild decrease however 5% difference certainely within realm of interobserver differences, no major decrease. She is on full dose anticoag for her afib, we will restart her home ASA. No beta blocker due to bradycardia, no ACE due to AKI, restart her home statin. Would not plan for cath at this time.    Dina RichJonathan Branch MD  420pm addendum Mixed venous O2 sat up to 61% on low dose milrinone at 0.0625 mcg/kg/min, will conitnue current dose, repeat co-ox in AM.    Dina RichJonathan Branch MD

## 2017-02-14 NOTE — Progress Notes (Signed)
At pt's bedside.  RN X2 witnessed pt have blank stare as o2 sats drop immediately from 98 to 70% on bedside ekg.  Event was approximately 15 seconds.  Pt slow for sats to increase.  RT paged.  Oxygen increased from 4LNC to 8L HFNC.  Pt urine output 113 ml for my shift.  Dr. Randie Heinzain paged and notified of events and urine output.  Will continue to monitor closely.

## 2017-02-14 NOTE — Progress Notes (Signed)
CXR complete.  Pending results.

## 2017-02-14 NOTE — Progress Notes (Signed)
Physical Therapy Treatment Patient Details Name: Charlotte Salas MRN: [id number] DOB: [birth year] Today's Date: 02/14/2017    History of Present Illness This 71 y.o. female admitted for aorto bifemoral bypass graft secondary to PVD with bil. claudication,  and AAA.  PMH includes:  DM, HTN, MI, cervical CA, hodgkin's lymphoma, ischemic cardiomyopathy, CHF, atrial septal defect    PT Comments    Pt is making good progress towards her goals. Pt currently minA for transfers and for ambulation of 150 feet using Eva walker. Pt requires skilled PT to continue to progress gait training and to improve overall strength and endurance to be able to safely mobilize in her home environment at discharge.     Follow Up Recommendations  Home health PT;Supervision/Assistance - 24 hour     Equipment Recommendations  3in1 (PT)       Precautions / Restrictions Precautions Precautions: Fall Restrictions Weight Bearing Restrictions: No    Mobility  Bed Mobility               General bed mobility comments: in chair at entry  Transfers Overall transfer level: Needs assistance   Transfers: Sit to/from Stand;Stand Pivot Transfers Sit to Stand: Min assist         General transfer comment: minA for steadying in standing  Ambulation/Gait Ambulation/Gait assistance: Min assist Ambulation Distance (Feet): 150 Feet Assistive device:  (Eva walker) Gait Pattern/deviations: Shuffle;Step-through pattern;Decreased step length - right;Decreased step length - left Gait velocity: slowed   General Gait Details: min assist for steadying and safety, vc for upright posture and walker mangement       Balance Overall balance assessment: Needs assistance Sitting-balance support: Feet supported Sitting balance-Leahy Scale: Good     Standing balance support: No upper extremity supported Standing balance-Leahy Scale: Fair Standing balance comment: able to steady in standig before reaching to walker                             Cognition Arousal/Alertness: Awake/alert Behavior During Therapy: WFL for tasks assessed/performed Overall Cognitive Status: Within Functional Limits for tasks assessed                                           General Comments General comments (skin integrity, edema, etc.): VSS, Pt on 1L O2 via nasal cannula, SaO2 96%O2, pt ambulated on RA and maintained SaO2 >90%O2      Pertinent Vitals/Pain Faces Pain Scale: Hurts little more Pain Location: abdomen  Pain Descriptors / Indicators: Operative site guarding;Guarding           PT Goals (current goals can now be found in the care plan section) Acute Rehab PT Goals Patient Stated Goal: to get back to normal  PT Goal Formulation: With patient Time For Goal Achievement: 02/17/17 Potential to Achieve Goals: Good Progress towards PT goals: Progressing toward goals    Frequency    Min 3X/week      PT Plan Current plan remains appropriate    Co-evaluation              AM-PAC PT "6 Clicks" Daily Activity  Outcome Measure  Difficulty turning over in bed (including adjusting bedclothes, sheets and blankets)?: Unable Difficulty moving from lying on back to sitting on the side of the bed? : Unable Difficulty sitting down on and standing up from a  chair with arms (e.g., wheelchair, bedside commode, etc,.)?: A Little Help needed moving to and from a bed to chair (including a wheelchair)?: A Little Help needed walking in hospital room?: A Little Help needed climbing 3-5 steps with a railing? : A Lot 6 Click Score: 13    End of Session Equipment Utilized During Treatment: Gait belt Activity Tolerance: Patient tolerated treatment well Patient left: in chair;with family/visitor present Nurse Communication: Mobility status PT Visit Diagnosis: Unsteadiness on feet (R26.81);Other abnormalities of gait and mobility (R26.89);Muscle weakness (generalized) (M62.81);Difficulty in  walking, not elsewhere classified (R26.2)     Time: [id number] PT Time Calculation (min) (ACUTE ONLY): 40 min  Charges:  $Gait Training: 23-37 mins                    G Codes:       Moneisha Vosler B. Beverely RisenVan Fleet PT, DPT Acute Rehabilitation  [phone] Pager [phone]     [name] 02/14/2017, 6:55 PM

## 2017-02-14 NOTE — Progress Notes (Signed)
Occupational Therapy Treatment Patient Details Name: Charlotte Salas J Axelrod MRN: [id number] DOB: [birth year] Today's Date: 02/14/2017    History of present illness This 71 y.o. female admitted for aorto bifemoral bypass graft secondary to PVD with bil. claudication,  and AAA.  PMH includes:  DM, HTN, MI, cervical CA, hodgkin's lymphoma, ischemic cardiomyopathy, CHF, atrial septal defect   OT comments  Pt is making excellent progress.  She requires mod A for ADLs, and ambulated with min A+2 for safety.   She is eager to regain independence and discharge home.  Will continue to follow.  VSS.   Follow Up Recommendations  No OT follow up;Supervision/Assistance - 24 hour    Equipment Recommendations  Tub/shower bench    Recommendations for Other Services      Precautions / Restrictions Precautions Precautions: Fall Restrictions Weight Bearing Restrictions: No       Mobility Bed Mobility               General bed mobility comments: in chair at entry  Transfers Overall transfer level: Needs assistance   Transfers: Sit to/from Stand;Stand Pivot Transfers Sit to Stand: Min assist Stand pivot transfers: Min assist       General transfer comment: minA for steadying in standing    Balance Overall balance assessment: Needs assistance Sitting-balance support: Feet supported Sitting balance-Leahy Scale: Good     Standing balance support: No upper extremity supported Standing balance-Leahy Scale: Fair Standing balance comment: able to steady in standig before reaching to walker                           ADL either performed or assessed with clinical judgement   ADL Overall ADL's : Needs assistance/impaired                         Toilet Transfer: Minimal assistance;+2 for safety/equipment;Ambulation;Comfort height toilet;RW;Grab bars   Toileting- Clothing Manipulation and Hygiene: Maximal assistance;Sit to/from stand       Functional mobility during  ADLs: Minimal assistance;+2 for safety/equipment;Rolling walker       Vision       Perception     Praxis      Cognition Arousal/Alertness: Awake/alert Behavior During Therapy: WFL for tasks assessed/performed Overall Cognitive Status: Within Functional Limits for tasks assessed                                          Exercises     Shoulder Instructions       General Comments VSS, Pt on 1L O2 via nasal cannula, SaO2 96%O2, pt ambulated on RA and maintained SaO2 >90%O2    Pertinent Vitals/ Pain       Pain Assessment: Faces Faces Pain Scale: Hurts little more Pain Location: abdomen  Pain Descriptors / Indicators: Operative site guarding;Guarding Pain Intervention(s): Premedicated before session;Monitored during session;Repositioned  Home Living                                          Prior Functioning/Environment              Frequency  Min 2X/week        Progress Toward Goals  OT Goals(current goals can now be found in the  care plan section)  Progress towards OT goals: Progressing toward goals  Acute Rehab OT Goals Patient Stated Goal: to get back to normal   Plan Discharge plan remains appropriate    Co-evaluation    PT/OT/SLP Co-Evaluation/Treatment: Yes Reason for Co-Treatment: Complexity of the patient's impairments (multi-system involvement);For patient/therapist safety   OT goals addressed during session: ADL's and self-care      AM-PAC PT "6 Clicks" Daily Activity     Outcome Measure   Help from another person eating meals?: A Little Help from another person taking care of personal grooming?: A Little Help from another person toileting, which includes using toliet, bedpan, or urinal?: A Lot Help from another person bathing (including washing, rinsing, drying)?: A Lot Help from another person to put on and taking off regular upper body clothing?: A Little Help from another person to put on and taking  off regular lower body clothing?: A Lot 6 Click Score: 15    End of Session Equipment Utilized During Treatment: Gait belt;Rolling walker  OT Visit Diagnosis: Pain;Unsteadiness on feet (R26.81) Pain - part of body:  (abdomen )   Activity Tolerance Patient tolerated treatment well   Patient Left in chair;with call bell/phone within reach;with family/visitor present   Nurse Communication Mobility status        Time: 1720-1801 OT Time Calculation (min): 41 min  Charges: OT General Charges $OT Visit: 1 Visit OT Treatments $Therapeutic Activity: 8-22 mins  [employer], OTR/L [phone]     Jeani Hawking M 02/14/2017, 7:23 PM

## 2017-02-14 NOTE — Progress Notes (Signed)
Troponin elevated.  2.19, continue to monitor.  No pain.

## 2017-02-14 NOTE — Progress Notes (Signed)
CRITICAL VALUE ALERT  Critical Value:  Troponin 2.19  Date & Time Notied: 12:09 02/14/2017  Provider Notified: Annie ParasIngold, NP  Orders Received/Actions taken: no new orders at this time.  Delories HeinzMelissa Indigo Chaddock, RN

## 2017-02-14 NOTE — Progress Notes (Signed)
Progress Note  Patient Name: Charlotte Salas Date of Encounter: 02/14/2017   Subjective   Patient with desats overnight, increased SOB, increased O2 requirement.   Inpatient Medications    Scheduled Meds: . chlorhexidine  15 mL Mouth Rinse BID  . Chlorhexidine Gluconate Cloth  6 each Topical Daily  . enoxaparin (LOVENOX) injection  70 mg Subcutaneous Q12H  . insulin aspart  0-15 Units Subcutaneous TID WC  . mouth rinse  15 mL Mouth Rinse q12n4p  . pantoprazole  40 mg Oral Daily  . sodium chloride flush  10-40 mL Intracatheter Q12H   Continuous Infusions: . sodium chloride 75 mL/hr at 02/14/17 0700  . amiodarone 30 mg/hr (02/14/17 0700)  . dextrose 5 % and 0.45% NaCl Stopped (02/12/17 0530)  . phenylephrine (NEO-SYNEPHRINE) Adult infusion Stopped (02/12/17 0500)   PRN Meds: acetaminophen **OR** acetaminophen, bisacodyl, guaiFENesin-dextromethorphan, hydrALAZINE, morphine injection, ondansetron, oxyCODONE-acetaminophen, phenol, potassium chloride, sodium chloride flush   Vital Signs    Vitals:   02/14/17 0600 02/14/17 0630 02/14/17 0700 02/14/17 0800  BP: 137/66 (!) 108/91 131/72 140/63  Pulse: (!) 54 (!) 59 (!) 54 (!) 52  Resp: (!) 22 18 11 12   Temp:   97.6 F (36.4 C)   TempSrc:   Oral   SpO2: 91% 92% 96% 98%  Weight:      Height:        Intake/Output Summary (Last 24 hours) at 02/14/17 0900 Last data filed at 02/14/17 0800  Gross per 24 hour  Intake             4001 ml  Output              433 ml  Net             3568 ml   Filed Weights   02/12/17 0530 02/13/17 0600 02/14/17 0530  Weight: 142 lb 3.2 oz (64.5 kg) 155 lb 6.8 oz (70.5 kg) 144 lb 2.9 oz (65.4 kg)    Telemetry     - Personally Reviewed  ECG     - Personally Reviewed  Physical Exam   GEN: No acute distress.   Neck: elevated JVD Cardiac: RRR, 2/6 systolic murmur RUSB Respiratory: Clear to auscultation bilaterally. GI: Soft, nontender, non-distended  MS: No edema; No  deformity. Neuro:  Nonfocal  Psych: Normal affect   Labs    Chemistry Recent Labs Lab 02/07/17 1154  02/10/17 0509  02/11/17 1311  02/13/17 0523 02/13/17 2010 02/14/17 0500  NA 138  < > 136  < > 134*  < > 135 128* 133*  K 4.7  < > 5.2*  < > 4.0  < > 4.5 4.8 4.1  CL 109  < > 110  < > 103  < > 102 96* 100*  CO2 20*  < > 22  < > 25  < > 24 22 24   GLUCOSE 106*  < > 196*  < > 155*  < > 161* 196* 155*  BUN 20  < > 20  < > 12  < > 26* 34* 37*  CREATININE 0.96  < > 1.38*  < > 1.00  < > 1.46* 1.77* 1.78*  CALCIUM 9.2  < > 7.4*  < > 7.6*  < > 7.7* 7.7* 7.6*  PROT 7.0  --  4.6*  --  5.1*  --   --   --   --   ALBUMIN 4.0  --  2.6*  --  2.4*  --   --   --   --  AST 21  --  52*  --  63*  --   --   --   --   ALT 13*  --  14  --  30  --   --   --   --   ALKPHOS 57  --  37*  --  53  --   --   --   --   BILITOT 0.8  --  0.8  --  0.9  --   --   --   --   GFRNONAA 57*  < > 37*  < > 55*  < > 34* 27* 27*  GFRAA >60  < > 43*  < > >60  < > 40* 32* 31*  ANIONGAP 9  < > 4*  < > 6  < > 9 10 9   < > = values in this interval not displayed.   Hematology Recent Labs Lab 02/12/17 1252 02/13/17 0523 02/13/17 2010  WBC 16.0* 13.6* 14.3*  RBC 3.66* 3.67* 3.70*  HGB 10.5* 10.6* 10.6*  HCT 31.4* 31.5* 32.6*  MCV 85.8 85.8 88.1  MCH 28.7 28.9 28.6  MCHC 33.4 33.7 32.5  RDW 14.3 14.9 15.5  PLT 142* 165 205    Cardiac EnzymesNo results for input(s): TROPONINI in the last 168 hours. No results for input(s): TROPIPOC in the last 168 hours.   BNPNo results for input(s): BNP, PROBNP in the last 168 hours.   DDimer No results for input(s): DDIMER in the last 168 hours.   Radiology    Dg Chest Port 1 View  Result Date: 02/14/2017 CLINICAL DATA:  Shortness of breath. EXAM: PORTABLE CHEST 1 VIEW COMPARISON:  One-view chest x-ray 02/12/2017 FINDINGS: The heart is enlarged. Atherosclerotic changes are noted at the aorta. A right IJ line is present. Interstitial edema is stable. Bilateral pleural effusions  are noted. Retrocardiac opacification is stable. IMPRESSION: 1. Stable cardiomegaly and edema compatible with congestive heart failure. 2. Stable bilateral pleural effusions. 3. Retrocardiac opacification again noted. Electronically Signed   By: Marin Roberts M.D.   On: 02/14/2017 07:17    Cardiac Studies     Patient Profile     71 year old female with past medical history of coronary artery disease with prior PCI, diabetes mellitus, hypertension, hyperlipidemia, ischemic cardiomyopathy, peripheral vascular disease and now status post aortic bifemoral bypass grafting for evaluation of postoperative atrial flutter and bradycardia. Patient underwent surgery on August 29. She has had slow recovery. She developed atrial flutter with rapid ventricular response. She was given IV Lopressor and started on IV amiodarone and developed posttermination pauses and bradycardia with heart rates occasionally in the 30s and pauses of 3.6 seconds maximum. Echocardiogram shows ejection fraction 30-35%, biatrial enlargement, mild right ventricular enlargement and moderate mitral regurgitation. Ejection fraction is mildly decreased compared to previous (35-40 8/17)  Assessment & Plan      1. CAD - history of stenting to RCA in 2001, stent to LAD in 2009 in setting of NSTEMI. Cath in 2009 showed RCA stent was occluded but had good collateral flow, managed medically. Stress in 2013 showed inferior scar with some ischemia consistent with her RCA disease, managed medically.  - echo 01/2016 LVEF 35-40%.   - repeat EKG,cycle troponins in setting of acute on chronic systolic HF. She denies any chest pain.    2. Acute on Chronic systolic HF/ICM - echo 01/2016 LVEF 35-40%.  - repeat echo 02/12/17 with LVEF 30-35%, moderate MR, PASP 45 - medical therapy has been limited  by soft bp's and low heart rates in general  - patient with desats overnight, increased O2 requirement. CXR with pulmonary edema. CVP at 17.  I/Os  indicate she is + 7.5 liters. Reported weights are labile and appear inaccurate.On exam elevated JVD - we will start lasix IV 60mg  bid. Follow renal function closely, hopefully her AKI is due to venous congestion and CHF and will improve with diuresis.  - order co-ox to check CO. Repeat CVP tomorrow AM. Change IVFs to KVO only.  - hold ACE due to AKI, hold beta blocker currently due to some bradycardia on amio.    3. Postop afib/aflutter - has been started on IV amiodarone. Plan for 8 weeks of therapy, then consider stopping at that time - would start eliquis 5mg  bid when ok from surgical standpoint. Reevaluate at outpatient f/u if will need continued therapy, at this time appears to be primarily postop afib and not a clear indication this will be recurrent and require long term anticoag  - has had low rate rates on amiodarone at times. We will d/c IV amio, start oral 200mg  bid (lower loading dose due to some bradycardia)   4. PAD - s/p aortobifemoral bypass  5. AKI - hopefully related to CHF and venous congestion. She has evidence of volume overload and elevated CVP so dues not appear to be prerenal, hopefully will improve with diuresis.   Charlotte CoddingtonSigned, Charlotte Littrell, MD  02/14/2017, 9:00 AM

## 2017-02-14 NOTE — Progress Notes (Signed)
  Progress Note    02/14/2017 2:02 PM 5 Days Post-Op  Subjective:  Tolerating clear liquids, had a bm, breathing is improving  Vitals:   02/14/17 1219 02/14/17 1233  BP:    Pulse: (!) 50 (!) 57  Resp: 10 18  Temp:    SpO2: 99% 96%    Physical Exam: aaox3 Abdomen is soft with incision cdi Bilateral groins with wound vac Strong signals in dp/pt bilaterally  CBC    Component Value Date/Time   WBC 14.3 (H) 02/13/2017 2010   RBC 3.70 (L) 02/13/2017 2010   HGB 10.6 (L) 02/13/2017 2010   HCT 32.6 (L) 02/13/2017 2010   PLT 205 02/13/2017 2010   MCV 88.1 02/13/2017 2010   MCH 28.6 02/13/2017 2010   MCHC 32.5 02/13/2017 2010   RDW 15.5 02/13/2017 2010   LYMPHSABS 2.0 02/13/2017 2010   MONOABS 1.3 (H) 02/13/2017 2010   EOSABS 0.0 02/13/2017 2010   BASOSABS 0.0 02/13/2017 2010    BMET    Component Value Date/Time   NA 133 (L) 02/14/2017 0500   K 4.1 02/14/2017 0500   CL 100 (L) 02/14/2017 0500   CO2 24 02/14/2017 0500   GLUCOSE 155 (H) 02/14/2017 0500   BUN 37 (H) 02/14/2017 0500   CREATININE 1.78 (H) 02/14/2017 0500   CALCIUM 7.6 (L) 02/14/2017 0500   GFRNONAA 27 (L) 02/14/2017 0500   GFRAA 31 (L) 02/14/2017 0500    INR    Component Value Date/Time   INR 1.32 02/09/2017 1530     Intake/Output Summary (Last 24 hours) at 02/14/17 1402 Last data filed at 02/14/17 1249  Gross per 24 hour  Intake           2551.6 ml  Output              803 ml  Net           1748.6 ml     Assessment: 71 y.o.femaleis s/p AoBF Plan: Neuro: low dose morphine pain control, holding toradol Pulm: oob, incentive spirometry CV: cardiology following for atrial flutter on amio, started therapeutic lovenox  Yesterday. Holding beta blockade with bradycardia. bnp elevated GI:  Cardiac diet FEN: cr stabilizing. Diurese for elevated bnp PPx: Tx lovenox   Johnathen Testa C. Randie Heinzain, MD [facility] [place] Office: [phone] Pager: [phone]  02/14/2017 2:02  PM

## 2017-02-14 NOTE — Progress Notes (Signed)
Dr. Mayford [facility]urner [facility] notified of pt's consistent HR of 44 on Amiodarone 30 mg per hr.  Pt asymptomatic with HR of 44bpm.  Orders received to continue amiodarone drip until cardiology sees in am.  Will continue to closely monitor.

## 2017-02-15 DIAGNOSIS — I2510 Atherosclerotic heart disease of native coronary artery without angina pectoris: Secondary | ICD-10-CM

## 2017-02-15 DIAGNOSIS — I5021 Acute systolic (congestive) heart failure: Secondary | ICD-10-CM

## 2017-02-15 LAB — BASIC METABOLIC PANEL
ANION GAP: 9 (ref 5–15)
BUN: 38 mg/dL — AB (ref 6–20)
CHLORIDE: 98 mmol/L — AB (ref 101–111)
CO2: 26 mmol/L (ref 22–32)
Calcium: 7.6 mg/dL — ABNORMAL LOW (ref 8.9–10.3)
Creatinine, Ser: 1.58 mg/dL — ABNORMAL HIGH (ref 0.44–1.00)
GFR, EST AFRICAN AMERICAN: 36 mL/min — AB (ref >60)
GFR, EST NON AFRICAN AMERICAN: 31 mL/min — AB (ref >60)
Glucose, Bld: 121 mg/dL — ABNORMAL HIGH (ref 65–99)
POTASSIUM: 3.3 mmol/L — AB (ref 3.5–5.1)
SODIUM: 133 mmol/L — AB (ref 135–145)

## 2017-02-15 LAB — GLUCOSE, CAPILLARY
GLUCOSE-CAPILLARY: 227 mg/dL — AB (ref 65–99)
GLUCOSE-CAPILLARY: 160 mg/dL — AB (ref 65–99)
Glucose-Capillary: 175 mg/dL — ABNORMAL HIGH (ref 65–99)
Glucose-Capillary: 127 mg/dL — ABNORMAL HIGH (ref 65–99)
Glucose-Capillary: 194 mg/dL — ABNORMAL HIGH (ref 65–99)
Glucose-Capillary: 154 mg/dL — ABNORMAL HIGH (ref 65–99)

## 2017-02-15 LAB — COOXEMETRY PANEL
Carboxyhemoglobin: 1 % (ref 0.5–1.5)
METHEMOGLOBIN: 1.2 % (ref 0.0–1.5)
O2 SAT: 70.3 %
TOTAL HEMOGLOBIN: 10.5 g/dL — AB (ref 12.0–16.0)

## 2017-02-15 LAB — CBC
HEMATOCRIT: 31 % — AB (ref 36.0–46.0)
Hemoglobin: 10.2 g/dL — ABNORMAL LOW (ref 12.0–15.0)
MCH: 28.7 pg (ref 26.0–34.0)
MCHC: 32.9 g/dL (ref 30.0–36.0)
MCV: 87.3 fL (ref 78.0–100.0)
PLATELETS: 216 10*3/uL (ref 150–400)
RBC: 3.55 MIL/uL — ABNORMAL LOW (ref 3.87–5.11)
RDW: 14.1 % (ref 11.5–15.5)
WBC: 12.3 10*3/uL — AB (ref 4.0–10.5)

## 2017-02-15 MED ORDER — POTASSIUM CHLORIDE CRYS ER 20 MEQ PO TBCR
40.0000 meq | EXTENDED_RELEASE_TABLET | Freq: Once | ORAL | Status: AC
  Administered 2017-02-15: 40 meq via ORAL
  Filled 2017-02-15: qty 2

## 2017-02-15 MED ORDER — ISOSORBIDE MONONITRATE ER 30 MG PO TB24
15.0000 mg | ORAL_TABLET | Freq: Every day | ORAL | Status: DC
  Administered 2017-02-15: 15 mg via ORAL
  Filled 2017-02-15: qty 1

## 2017-02-15 MED ORDER — HYDRALAZINE HCL 25 MG PO TABS
12.5000 mg | ORAL_TABLET | Freq: Three times a day (TID) | ORAL | Status: DC
  Administered 2017-02-15 (×3): 12.5 mg via ORAL
  Filled 2017-02-15 (×3): qty 1

## 2017-02-15 MED ORDER — FUROSEMIDE 10 MG/ML IJ SOLN
60.0000 mg | Freq: Once | INTRAMUSCULAR | Status: AC
  Administered 2017-02-15: 60 mg via INTRAVENOUS
  Filled 2017-02-15: qty 6

## 2017-02-15 NOTE — Progress Notes (Signed)
    Subjective  - POD #6, s/p ABF  Up in chair Feels good  Tolerating diet   Physical Exam:  abd soft Doppler pedal pulses Incisional vac in place   Assessment/Plan:  POD #6   CV:  Appreciate cardiology assistance.  Remains on milranone for CHF.  Amio and Anticoagulation for AFIB, now in NSR  Renal:  Cr trending down, good uop GI:  Reg diet OOB as tolerated [place], once acute issues resolved Remove incisional vacs tomorrow  Durene CalBrabham, Wells 02/15/2017 9:27 PM --  Vitals:   02/15/17 1900 02/15/17 2000  BP: 130/87 (!) 148/62  Pulse: 68 64  Resp: 17 15  Temp:  97.8 F (36.6 C)  SpO2: 95% 94%    Intake/Output Summary (Last 24 hours) at 02/15/17 2127 Last data filed at 02/15/17 2000  Gross per 24 hour  Intake           1047.6 ml  Output             1595 ml  Net           -547.4 ml     Laboratory CBC    Component Value Date/Time   WBC 12.3 (H) 02/15/2017 0410   HGB 10.2 (L) 02/15/2017 0410   HCT 31.0 (L) 02/15/2017 0410   PLT 216 02/15/2017 0410    BMET    Component Value Date/Time   NA 133 (L) 02/15/2017 0410   K 3.3 (L) 02/15/2017 0410   CL 98 (L) 02/15/2017 0410   CO2 26 02/15/2017 0410   GLUCOSE 121 (H) 02/15/2017 0410   BUN 38 (H) 02/15/2017 0410   CREATININE 1.58 (H) 02/15/2017 0410   CALCIUM 7.6 (L) 02/15/2017 0410   GFRNONAA 31 (L) 02/15/2017 0410   GFRAA 36 (L) 02/15/2017 0410    COAG Lab Results  Component Value Date   INR 1.32 02/09/2017   INR 0.97 02/07/2017   INR 0.9 07/14/2007   No results found for: PTT  Antibiotics Anti-infectives    Start     Dose/Rate Route Frequency Ordered Stop   02/10/17 0200  cefUROXime (ZINACEF) 1.5 g in dextrose 5 % 50 mL IVPB     1.5 g 100 mL/hr over 30 Minutes Intravenous Every 12 hours 02/09/17 1916 02/10/17 1338   02/09/17 1415  cefUROXime (ZINACEF) 1.5 g in dextrose 5 % 50 mL IVPB  Status:  Discontinued     1.5 g 100 mL/hr over 30 Minutes Intravenous To Surgery  02/09/17 1400 02/09/17 1908   02/09/17 0549  cefUROXime (ZINACEF) 1.5 g in dextrose 5 % 50 mL IVPB     1.5 g 100 mL/hr over 30 Minutes Intravenous 30 min pre-op 02/09/17 0549 02/10/17 0700       V. Charlena CrossWells Brabham IV, M.D. [facility] [place] Office: [phone] Pager:  [phone]

## 2017-02-15 NOTE — Consult Note (Signed)
Advanced Heart Failure Team Consult Note   Primary Physician: Dr Leary Roca  Primary Cardiologist:  Dr Clifton James   Reason for Consultation: Cardiogenic Shock  HPI:    Charlotte Salas is seen today for evaluation of heart failure at the request of Dr Wyline Mood.    [name] is a 71 year old with a history of ICM, CAD, S/P RCA BMS 2001, S/P LAD BMS 2009 RCA was occluded with collaterals, non-hodgkins lymphoma (completed chemo in 2001), HTN, hyperlipidemia, PAD, smoker, smoker, DMII, and cervical cancer.   Admitted for scheduled  Aortic Bi Fem on 8/29. Post op day 1 she developed A fib RV. Given lopressor and started on amio drip. She developed pauses on IV amio was cut back 15 mg per hour. Yesterday she developed respiratory distress with oxygen increased from 4 liters to 8 liters. CXR with pulmonary edema and CVP 17. IV lasix started. Troponin 2.1>2.0>1.9. Later mixed venous saturation was 47% so milrinone 0.0625 mcg started. Repeat mixed venous saturation improved to 61%. Weight up 2 pounds.  Feeling better. Mild dyspnea with exertion.   ECHO 02/12/2017 EF 30-35% RV mildly dilated. RA severely dilated, Peak PA pressure 45 mg hg.    Nuclear stress test 02/08/12 showed a medium, moderate partially reversible basal to mid inferior and inferolateral perfusion defect suggesting infarct with peri-infarct ischemia with corresponding inferior and inferolateral wall motion abnormality, felt to be consistent with her known RCA occlusion.  Review of Systems: [y] = yes, [ ]  = no   General: Weight gain [ ] ; Weight loss [Y]; Anorexia [ ] ; Fatigue [Y ]; Fever [ ] ; Chills [ ] ; Weakness [ ]   Cardiac: Chest pain/pressure [ ] ; Resting SOB [ ] ; Exertional SOB [Y ]; Orthopnea [ ] ; Pedal Edema [ ] ; Palpitations [ ] ; Syncope [ ] ; Presyncope [ ] ; Paroxysmal nocturnal dyspnea[ ]   Pulmonary: Cough [ ] ; Wheezing[ ] ; Hemoptysis[ ] ; Sputum [ ] ; Snoring [ ]   GI: Vomiting[ ] ; Dysphagia[ ] ; Melena[ ] ; Hematochezia [ ] ;  Heartburn[ ] ; Abdominal pain [ ] ; Constipation [ ] ; Diarrhea [ ] ; BRBPR [ ]   GU: Hematuria[ ] ; Dysuria [ ] ; Nocturia[ ]   Vascular: Pain in legs with walking [ ] ; Pain in feet with lying flat [ ] ; Non-healing sores [ ] ; Stroke [ ] ; TIA [ ] ; Slurred speech [ ] ;  Neuro: Headaches[ ] ; Vertigo[ ] ; Seizures[ ] ; Paresthesias[ ] ;Blurred vision [ ] ; Diplopia [ ] ; Vision changes [ ]   Ortho/Skin: Arthritis [ ] ; Joint pain [Y ]; Muscle pain [ ] ; Joint swelling [ ] ; Back Pain [Y ]; Rash [ ]   Psych: Depression[ ] ; Anxiety[ ]   Heme: Bleeding problems [ ] ; Clotting disorders [ ] ; Anemia [ ]   Endocrine: Diabetes [Y ]; Thyroid dysfunction[ ]   Home Medications Prior to Admission medications   Medication Sig Start Date End Date Taking? Authorizing Provider  aspirin EC 81 MG tablet Take 81 mg by mouth daily.   Yes [provider]  B Complex Vitamins (B-COMPLEX/B-12 PO) Take 1 tablet by mouth daily.    Yes [provider]  carvedilol (COREG) 12.5 MG tablet TAKE 1 TABLET (12.5 MG TOTAL) BY MOUTH 2 (TWO) TIMES DAILY. 01/26/17  Yes Antoine Poche, MD  Cranberry 1000 MG CAPS Take 1 capsule by mouth daily.    Yes [provider]  lisinopril (PRINIVIL,ZESTRIL) 10 MG tablet Take 1 tablet (10 mg total) by mouth daily. 01/26/17  Yes Antoine Poche, MD  metFORMIN (GLUCOPHAGE) 500 MG tablet Take 500  mg by mouth daily with breakfast.    Yes [provider]  nitroGLYCERIN (NITROSTAT) 0.4 MG SL tablet Place 1 tablet (0.4 mg total) under the tongue every 5 (five) minutes as needed for chest pain (for chest pain). 10/17/15  Yes Kathleene Hazel, MD  omega-3 acid ethyl esters (LOVAZA) 1 g capsule Take by mouth 2 (two) times daily.   Yes [provider]  pregabalin (LYRICA) 75 MG capsule Take 75 mg by mouth 2 (two) times daily.   Yes [provider]  simvastatin (ZOCOR) 20 MG tablet Take 1 tablet (20 mg total) by mouth daily. 01/26/17  Yes Branch, Dorothe Pea, MD    traMADol (ULTRAM) 50 MG tablet Take 50 mg by mouth every 6 (six) hours as needed for moderate pain or severe pain.    Yes [provider]    Past Medical History: Past Medical History:  Diagnosis Date  . Atrial septal defect    hx of it  . CAD (coronary artery disease)    s/p prior stenting of the right coronary artery in '01 and non-ST-elevation myocardial infarction in jan '09, with a new bare-metal stent placed in the ostium of the left anterior descending coronary artert and the coronary anatomy as described above with total occlusion of the previously placed right coronary artery stent. EF 35-40%  . CHF (congestive heart failure) (HCC)   . Diabetes mellitus without complication (HCC)   . Hepatitis 1971   " A"  . History of cervical cancer   . History of non-Hodgkin's lymphoma   . HTN (hypertension)   . HTN (hypertension)   . Hyperlipidemia   . Ischemic cardiomyopathy    EF 35-40% by 01/2016 echo  . Myocardial infarction (HCC) 2009, 2000  . Other and unspecified hyperlipidemia   . Peripheral vascular disease (HCC)   . Personal history of malignant neoplasm of cervix uteri   . Personal history of other lymphatic and hematopoietic neoplasm 2002   hodgkins lymphoma    Past Surgical History: Past Surgical History:  Procedure Laterality Date  . ABDOMINAL AORTOGRAM N/A 01/11/2017   Procedure: Abdominal Aortogram;  Surgeon: Nada Libman, MD;  Location: [facility];  Service: Cardiovascular;  Laterality: N/A;  . ANGIOPLASTY Left 02/09/2017   Procedure: LEFT PROFUNDA  FEMORAL ARTERY ANGIOPLASTY USING XENOSURE BIOLOGIC PATCH;  Surgeon: Nada Libman, MD;  Location: [facility] OR;  Service: Vascular;  Laterality: Left;  . AORTA - BILATERAL FEMORAL ARTERY BYPASS GRAFT N/A 02/09/2017   Procedure: AORTA BIFEMORAL BYPASS GRAFT USING HEMASHIELD GOLD;  Surgeon: Nada Libman, MD;  Location: [facility] OR;  Service: Vascular;  Laterality: N/A;  . ASD AND VSD REPAIR    . LOWER  EXTREMITY ANGIOGRAPHY Bilateral 01/11/2017   Procedure: Lower Extremity Angiography;  Surgeon: Nada Libman, MD;  Location: [facility];  Service: Cardiovascular;  Laterality: Bilateral;  . VEIN SURGERY Bilateral Jan. Feb. and Mar. 2018   Dr. Earna Coder ([facility] vein [facility] in [place], [place]    Family History: Family History  Problem Relation Age of Onset  . Leukemia Father   . Heart Problems Mother   . Heart attack Sister     Social History: Social History   Social History  . Marital status: Divorced    Spouse name: N/A  . Number of children: N/A  . Years of education: N/A   Social History Main Topics  . Smoking status: Current Every Day Smoker    Packs/day: 0.25  Years: 50.00    Types: Cigarettes, E-cigarettes  . Smokeless tobacco: Never Used     Comment: 1 ppd  . Alcohol use No  . Drug use: No  . Sexual activity: Not Asked   Other Topics Concern  . None   Social History Narrative   Lives in [place], [place] alone.   Tracer study.     Allergies:  Allergies  Allergen Reactions  . Codeine Nausea Only    Objective:    Vital Signs:   Temp:  [97.6 F (36.4 C)-98 F (36.7 C)] 97.6 F (36.4 C) (09/04 0753) Pulse Rate:  [39-69] 69 (09/04 0700) Resp:  [9-25] 19 (09/04 0700) BP: (116-151)/(48-119) 145/57 (09/04 0700) SpO2:  [90 %-100 %] 97 % (09/04 0700) Weight:  [149 lb 14.6 oz (68 kg)] 149 lb 14.6 oz (68 kg) (09/04 0500) Last BM Date: 02/14/17  Weight change: Filed Weights   02/13/17 0600 02/14/17 0530 02/15/17 0500  Weight: 155 lb 6.8 oz (70.5 kg) 144 lb 2.9 oz (65.4 kg) 149 lb 14.6 oz (68 kg)    Intake/Output:   Intake/Output Summary (Last 24 hours) at 02/15/17 0800 Last data filed at 02/15/17 0600  Gross per 24 hour  Intake          1078.72 ml  Output             1590 ml  Net          -511.28 ml      Physical Exam   CVP 9-10 personally reviewed General:  Chronically ill  No resp difficulty HEENT: normal Neck: supple. JVP ~10  .  Carotids 2+ bilat; no bruits. No lymphadenopathy or thyromegaly appreciated. RIJ central line Cor: PMI nondisplaced. Regular rate & rhythm. No rubs, gallops or murmurs. Lungs: clear Abdomen: soft, nontender, nondistended. No hepatosplenomegaly. No bruits or masses. Good bowel sounds. Extremities: no cyanosis, clubbing, rash, edema. R and L groin VACs.  Neuro: alert & orientedx3, cranial nerves grossly intact. moves all 4 extremities w/o difficulty. Affect pleasant GU: foley yellow urine.    Telemetry   NSR 70s personally reviewed  EKG     A fib 93 bpm on 02/09/17  Labs   Basic Metabolic Panel:  Recent Labs Lab 02/09/17 1530 02/10/17 0509  02/12/17 0322 02/13/17 0523 02/13/17 2010 02/14/17 0500 02/15/17 0410  NA 138 136  < > 133* 135 128* 133* 133*  K 4.8 5.2*  < > 3.7 4.5 4.8 4.1 3.3*  CL 110 110  < > 100* 102 96* 100* 98*  CO2 21* 22  < > 26 24 22 24 26   GLUCOSE 182* 196*  < > 188* 161* 196* 155* 121*  BUN 18 20  < > 13 26* 34* 37* 38*  CREATININE 1.07* 1.38*  < > 1.04* 1.46* 1.77* 1.78* 1.58*  CALCIUM 7.7* 7.4*  < > 7.5* 7.7* 7.7* 7.6* 7.6*  MG 1.4* 2.3  --   --   --   --   --   --   < > = values in this interval not displayed.  Liver Function Tests:  Recent Labs Lab 02/10/17 0509 02/11/17 1311  AST 52* 63*  ALT 14 30  ALKPHOS 37* 53  BILITOT 0.8 0.9  PROT 4.6* 5.1*  ALBUMIN 2.6* 2.4*    Recent Labs Lab 02/10/17 0509  AMYLASE 53   No results for input(s): AMMONIA in the last 168 hours.  CBC:  Recent Labs Lab 02/12/17 0322 02/12/17 1252 02/13/17 0523 02/13/17 2010  02/15/17 0410  WBC 15.7* 16.0* 13.6* 14.3* 12.3*  NEUTROABS  --   --   --  11.0*  --   HGB 7.7* 10.5* 10.6* 10.6* 10.2*  HCT 22.8* 31.4* 31.5* 32.6* 31.0*  MCV 88.7 85.8 85.8 88.1 87.3  PLT 136* 142* 165 205 216    Cardiac Enzymes:  Recent Labs Lab 02/14/17 0949 02/14/17 1543 02/14/17 2134  TROPONINI 2.19* 2.03* 1.90*    BNP: BNP (last 3 results)  Recent Labs   02/14/17 0948  BNP 1,788.7*    ProBNP (last 3 results) No results for input(s): PROBNP in the last 8760 hours.   CBG:  Recent Labs Lab 02/13/17 1936 02/13/17 2142 02/14/17 0805 02/14/17 1536 02/14/17 2142  GLUCAP 198* 180* 182* 175* 141*    Coagulation Studies: No results for input(s): LABPROT, INR in the last 72 hours.   Imaging    No results found.   Medications:     Current Medications: . amiodarone  200 mg Oral BID  . aspirin EC  81 mg Oral Daily  . atorvastatin  10 mg Oral q1800  . chlorhexidine  15 mL Mouth Rinse BID  . Chlorhexidine Gluconate Cloth  6 each Topical Daily  . enoxaparin (LOVENOX) injection  65 mg Subcutaneous Q24H  . insulin aspart  0-15 Units Subcutaneous TID WC  . mouth rinse  15 mL Mouth Rinse q12n4p  . pantoprazole  40 mg Oral Daily  . sodium chloride flush  10-40 mL Intracatheter Q12H     Infusions: . dextrose 5 % and 0.45% NaCl 10 mL/hr at 02/15/17 0600  . milrinone 0.0625 mcg/kg/min (02/15/17 0600)  . phenylephrine (NEO-SYNEPHRINE) Adult infusion Stopped (02/12/17 0500)       Patient Profile   [name] is a [age] year with  a history of ICM, CAD, S/P RCA BMS 2001, S/P LAD BMS 2009 RCA was occluded with collaterals, non-hodgkins lymphoma (completed chemo in 2001), HTN, hyperlipidemia, PAD, smoker, smoker, DMII, and cervical cancer.   Admitted with Aorto Bifem. Post op course complicated by acute respiratory failure and a/c systolic heart failure.    Assessment/Plan   1. PAD- S/P Aorto Bi Fem 8/29 with bilateral vac R and L groin 2. Acute Respiratory Failure - Sats down to the 70s on 9/3. Improved with IV lasix. Oxygen weaned to room air today. Sats >95%.  3. A/C Systolic Heart Failure- ICM. ECHO this admit with EF 30-35%.   Yesterday CO-OX 47% . Started on milrinone 0.0625 mcg. Todays CO-OX is 62%.  CVP trending down 9-10. Give one more dose of IV lasix 60 mg now.  Hold off on bb/ace with low output and elevated  creatinine. Add 12.5 mg hydralazine tid and 15 mg imdur daily.  4. A fib RVR- started on IV amio. During amio load had pauses. Now on po amio. Converted to NSR. On lovenox. Start apixaban 5 mg twice a day  5. AKI- creatinine on admit 1.0. Creatinine peaked at 1.78. Todays creatinine 1.58. Will need to watch closely. Hold off on ace.  6.CAD- BMS RCA 2001 and BMS LAD 2009. Had elevated troponin 2.2>2.0>1.9. No chest pain. On statin.  7. Smoker - discussed smoking cessation.  8. H/O non hodgkins lymphoma  2001 treated with chemo.  9. DMII- on sliding scale. Off metformin with elevated creatinine.     Length of Stay: 6  Amy Clegg, NP  02/15/2017, 8:00 AM  Advanced Heart Failure Team Pager [phone] (M-F; 7a - 4p)  Please contact [facility]  for night-coverage after hours (4p -7a ) and weekends on [email or web address]  Patient seen and examined with Tonye Becket, NP. We discussed all aspects of the encounter. I agree with the assessment and plan as stated above.   71 y/o smoker (ongoing tobacco use) with known CAD now POD #6 aorto-bifem bypass. Over weekend developed low-output HF and AKI. Echo read as EF 30-35% (baseline 35-40% s/p previous inferior MI). I have reviewed echo and feel EF closer to 25%. Currently improved with milrinone. Feeling better but still weak. Last cath was 2009. Denies anginal symptoms at baseline but very limited due to claudication.   By echo she has evidence of worsening LV dysfunction. This may be related to post-op stress but I am also concerned about progressive CAD. Will continue to stabilize with milrinone support with slow wean. Will also adjust oral cardiac meds. Hold b-block for now. Have recommended cardiac cath (from radial approach) prior to discharge.   Had post-op AF now back in NSR on amio. Will continue for now. If this is only episodee of AF will likely not commit her to long-term AC.  We will follow.   Arvilla Meres, MD  10:20 PM

## 2017-02-16 LAB — BASIC METABOLIC PANEL
ANION GAP: 6 (ref 5–15)
BUN: 26 mg/dL — ABNORMAL HIGH (ref 6–20)
CALCIUM: 7.8 mg/dL — AB (ref 8.9–10.3)
CO2: 29 mmol/L (ref 22–32)
Chloride: 99 mmol/L — ABNORMAL LOW (ref 101–111)
Creatinine, Ser: 1.25 mg/dL — ABNORMAL HIGH (ref 0.44–1.00)
GFR, EST AFRICAN AMERICAN: 48 mL/min — AB (ref >60)
GFR, EST NON AFRICAN AMERICAN: 42 mL/min — AB (ref >60)
Glucose, Bld: 114 mg/dL — ABNORMAL HIGH (ref 65–99)
POTASSIUM: 3.7 mmol/L (ref 3.5–5.1)
Sodium: 134 mmol/L — ABNORMAL LOW (ref 135–145)

## 2017-02-16 LAB — COOXEMETRY PANEL
CARBOXYHEMOGLOBIN: 1.2 % (ref 0.5–1.5)
METHEMOGLOBIN: 1.2 % (ref 0.0–1.5)
O2 SAT: 69.1 %
Total hemoglobin: 10.8 g/dL — ABNORMAL LOW (ref 12.0–16.0)

## 2017-02-16 LAB — GLUCOSE, CAPILLARY
GLUCOSE-CAPILLARY: 168 mg/dL — AB (ref 65–99)
Glucose-Capillary: 129 mg/dL — ABNORMAL HIGH (ref 65–99)
Glucose-Capillary: 206 mg/dL — ABNORMAL HIGH (ref 65–99)
Glucose-Capillary: 175 mg/dL — ABNORMAL HIGH (ref 65–99)

## 2017-02-16 MED ORDER — AMIODARONE HCL 200 MG PO TABS
200.0000 mg | ORAL_TABLET | Freq: Every day | ORAL | Status: DC
  Administered 2017-02-16 – 2017-02-17 (×2): 200 mg via ORAL
  Filled 2017-02-16 (×2): qty 1

## 2017-02-16 MED ORDER — HYDRALAZINE HCL 25 MG PO TABS
25.0000 mg | ORAL_TABLET | Freq: Three times a day (TID) | ORAL | Status: DC
  Administered 2017-02-16 (×3): 25 mg via ORAL
  Filled 2017-02-16 (×4): qty 1

## 2017-02-16 MED ORDER — SPIRONOLACTONE 25 MG PO TABS
12.5000 mg | ORAL_TABLET | Freq: Every day | ORAL | Status: DC
  Administered 2017-02-16 – 2017-02-19 (×4): 12.5 mg via ORAL
  Filled 2017-02-16 (×4): qty 1

## 2017-02-16 MED ORDER — ISOSORBIDE MONONITRATE ER 30 MG PO TB24
30.0000 mg | ORAL_TABLET | Freq: Every day | ORAL | Status: DC
  Administered 2017-02-16: 30 mg via ORAL
  Filled 2017-02-16 (×2): qty 1

## 2017-02-16 MED ORDER — ENOXAPARIN SODIUM 80 MG/0.8ML ~~LOC~~ SOLN
65.0000 mg | Freq: Two times a day (BID) | SUBCUTANEOUS | Status: DC
  Administered 2017-02-16 (×2): 65 mg via SUBCUTANEOUS
  Filled 2017-02-16 (×3): qty 0.8

## 2017-02-16 NOTE — Progress Notes (Addendum)
  AAA Progress Note    02/16/2017 7:24 AM 7 Days Post-Op  Subjective:  "I'm Hungry-they are starving me!" Feels better.  Didn't walk yesterday bc she didn't feel strong enough.  Wants to walk today.  Feet feel good.  Wish she had known about this a year ago.  Afebrile HR  50's-70's NSR 130's-160's systolic 96% 2LO2NC  Vitals:   02/16/17 0600 02/16/17 0700  BP: (!) 152/66   Pulse: (!) 59 60  Resp: 12 12  Temp:    SpO2: 98% 95%    Physical Exam: Cardiac:  Regular Lungs:  CTAB Abdomen:  Soft, NT/ND; -N/V; +flatus; +BM Incisions:  laparototmy incision is clean and dry and healing nicely; bilateral groins with Pravena vacs in place Extremities:  +palpable DP pulses bilaterally.  CBC    Component Value Date/Time   WBC 12.3 (H) 02/15/2017 0410   RBC 3.55 (L) 02/15/2017 0410   HGB 10.2 (L) 02/15/2017 0410   HCT 31.0 (L) 02/15/2017 0410   PLT 216 02/15/2017 0410   MCV 87.3 02/15/2017 0410   MCH 28.7 02/15/2017 0410   MCHC 32.9 02/15/2017 0410   RDW 14.1 02/15/2017 0410   LYMPHSABS 2.0 02/13/2017 2010   MONOABS 1.3 (H) 02/13/2017 2010   EOSABS 0.0 02/13/2017 2010   BASOSABS 0.0 02/13/2017 2010    BMET    Component Value Date/Time   NA 134 (L) 02/16/2017 0433   K 3.7 02/16/2017 0433   CL 99 (L) 02/16/2017 0433   CO2 29 02/16/2017 0433   GLUCOSE 114 (H) 02/16/2017 0433   BUN 26 (H) 02/16/2017 0433   CREATININE 1.25 (H) 02/16/2017 0433   CALCIUM 7.8 (L) 02/16/2017 0433   GFRNONAA 42 (L) 02/16/2017 0433   GFRAA 48 (L) 02/16/2017 0433    INR    Component Value Date/Time   INR 1.32 02/09/2017 1530     Intake/Output Summary (Last 24 hours) at 02/16/17 0724 Last data filed at 02/16/17 0500  Gross per 24 hour  Intake           1037.6 ml  Output             1310 ml  Net           -272.4 ml     Assessment/Plan:  71 y.o. female is s/p  Aortobifemoral bypass 7 Days Post-Op  CV:  NSR-on amio and Lovenox for Afib; Okay to start Eliquis from vascular  standpoint.  Thrombocytopenia improved.  Milrinone for CHF Pulmonary:  Lungs clear at bases; 96% 2LO2NC; wean O2 GI:  -N/V; +BM; +flatus and tolerating heart healthy diet. Renal:  Creatinine continues to improve and is 1.25 today Incisions:  Pravena vacs removed and bilateral groin incisions are healing nicely.  Dry dressing to bilateral groins daily and as needed to help prevent wound infection.  -continue OOB and mobilize -will consult social work to begin work on disposition.    Doreatha MassedSamantha Rhyne, PA-C [facility] [phone] 02/16/2017 7:24 AM

## 2017-02-16 NOTE — Progress Notes (Signed)
Physical Therapy Treatment Patient Details Name: Charlotte Salas MRN: [id number] DOB: 11-11-1945 Today's Date: 02/16/2017    History of Present Illness This 71 y.o. female admitted for aorto bifemoral bypass graft secondary to PVD with bil. claudication,  and AAA.  PMH includes:  DM, HTN, MI, cervical CA, hodgkin's lymphoma, ischemic cardiomyopathy, CHF, atrial septal defect    PT Comments    Patient continues to make progress with mobility and tolerated increased gait distance with use of RW and min guard assist. Pt does need assistance with transitional movements and bed mobility.  Pt will need 24 hour supervision/assistance (not sure if family can provide this, pt reports sister cannot physically assist) initially and will continue to benefit from further skilled PT services to maximize independence and safety with mobility.   Follow Up Recommendations  Home health PT;Supervision/Assistance - 24 hour     Equipment Recommendations  3in1 (PT)    Recommendations for Other Services       Precautions / Restrictions Precautions Precautions: Fall Restrictions Weight Bearing Restrictions: No    Mobility  Bed Mobility Overal bed mobility: Needs Assistance Bed Mobility: Rolling;Sit to Sidelying Rolling: Min guard       Sit to sidelying: Min assist General bed mobility comments: pt OOB in chair upon arrival; assist to bring bilat LE into bed; cues for sequencing  Transfers Overall transfer level: Needs assistance Equipment used: Rolling walker (2 wheeled) Transfers: Sit to/from Stand Sit to Stand: Min assist         General transfer comment: assist to power up into standing  Ambulation/Gait Ambulation/Gait assistance: Min guard Ambulation Distance (Feet): 175 Feet Assistive device: Rolling walker (2 wheeled) Gait Pattern/deviations: Step-through pattern;Decreased stride length Gait velocity: decreased   General Gait Details: min guard for safety; slow but steady gait;  cues for posture and proximity of RW   Stairs            Wheelchair Mobility    Modified Rankin (Stroke Patients Only)       Balance Overall balance assessment: Needs assistance Sitting-balance support: Feet supported Sitting balance-Leahy Scale: Good     Standing balance support: No upper extremity supported Standing balance-Leahy Scale: Fair Standing balance comment: pt able to stand and perform pericare without assist                            Cognition Arousal/Alertness: Awake/alert Behavior During Therapy: WFL for tasks assessed/performed Overall Cognitive Status: Within Functional Limits for tasks assessed                                        Exercises      General Comments General comments (skin integrity, edema, etc.): VSS      Pertinent Vitals/Pain Pain Assessment: Faces Faces Pain Scale: Hurts little more Pain Location: abdomen  Pain Descriptors / Indicators: Guarding;Discomfort;Aching Pain Intervention(s): Limited activity within patient's tolerance;Monitored during session;Premedicated before session;Repositioned    Home Living                      Prior Function            PT Goals (current goals can now be found in the care plan section) Acute Rehab PT Goals Patient Stated Goal: to get back to normal  PT Goal Formulation: With patient Time For Goal Achievement: 02/17/17 Potential  to Achieve Goals: Good Progress towards PT goals: Progressing toward goals    Frequency    Min 3X/week      PT Plan Current plan remains appropriate    Co-evaluation              AM-PAC PT "6 Clicks" Daily Activity  Outcome Measure  Difficulty turning over in bed (including adjusting bedclothes, sheets and blankets)?: Unable Difficulty moving from lying on back to sitting on the side of the bed? : Unable Difficulty sitting down on and standing up from a chair with arms (e.g., wheelchair, bedside commode,  etc,.)?: A Little Help needed moving to and from a bed to chair (including a wheelchair)?: A Little Help needed walking in hospital room?: A Little Help needed climbing 3-5 steps with a railing? : A Little 6 Click Score: 14    End of Session Equipment Utilized During Treatment: Gait belt Activity Tolerance: Patient tolerated treatment well Patient left: in bed;with call bell/phone within reach Nurse Communication: Mobility status PT Visit Diagnosis: Unsteadiness on feet (R26.81);Other abnormalities of gait and mobility (R26.89);Muscle weakness (generalized) (M62.81);Difficulty in walking, not elsewhere classified (R26.2)     Time: 1610-9604 PT Time Calculation (min) (ACUTE ONLY): 48 min  Charges:  $Gait Training: 23-37 mins $Therapeutic Activity: 8-22 mins                    G Codes:       Erline Levine, PTA Pager: [phone]     Carolynne Edouard 02/16/2017, 11:17 AM

## 2017-02-16 NOTE — Clinical Social Work Note (Signed)
Clinical Social Work Assessment  Patient Details  Name: Charlotte Salas MRN: [id number] Date of Birth: [birth year]  Date of referral:  02/16/17               Reason for consult:  Facility Placement                Permission sought to share information with:  Facility Industrial/product designerContact Representative Permission granted to share information::  Yes, Verbal Permission Granted  Name::        Agency::  SNFs  Relationship::     Contact Information:     Housing/Transportation Living arrangements for the past 2 months:  Single Family Home Source of Information:  Patient Patient Interpreter Needed:  None Criminal Activity/Legal Involvement Pertinent to Current Situation/Hospitalization:  No - Comment as needed Significant Relationships:  Siblings Lives with:  Self Do you feel safe going back to the place where you live?  No Need for family participation in patient care:  No (Coment)  Care giving concerns: Pt lives at home alone- has sibling who lives in [place] area but not physically able to assist much.   Social Worker assessment / plan:  CSW spokew with pt concerning PT recommendation for 24 hour help and assistance at time of DC.  Pt state sthat she understands need fo increased help and is familiar with SNF.  Employment status:  Retired Engineer, miningnsurance information:  Medicare PT Recommendations:  Home with Home Health, 24 Hour Supervision Information / Referral to community resources:  Skilled Nursing Facility  Patient/Family's Response to care:  Agreeable to short rehab stay with plan to stay with sister after rehab for a week or so while she continues to recover.  Patient/Family's Understanding of and Emotional Response to Diagnosis, Current Treatment, and Prognosis:  No questions or concerns at this time hopeful that rehab stay will be short and she can return to living independently again soon.  Emotional Assessment Appearance:  Appears stated age Attitude/Demeanor/Rapport:    Affect  (typically observed):  Appropriate, Accepting Orientation:  Oriented to Situation, Oriented to  Time, Oriented to Place, Oriented to Self Alcohol / Substance use:  Not Applicable Psych involvement (Current and /or in the community):  No (Comment)  Discharge Needs  Concerns to be addressed:  Care Coordination Readmission within the last 30 days:  No Current discharge risk:  Physical Impairment Barriers to Discharge:  Continued Medical Work up   Burna SisUris, Renad Jenniges H, LCSW 02/16/2017, 12:36 PM

## 2017-02-16 NOTE — Progress Notes (Addendum)
Occupational Therapy Treatment Patient Details Name: Charlotte Salas MRN: [id number] DOB: [birth year] Today's Date: 02/16/2017    History of present illness This 71 y.o. female admitted for aorto bifemoral bypass graft secondary to PVD with bil. claudication,  and AAA.  PMH includes:  DM, HTN, MI, cervical CA, hodgkin's lymphoma, ischemic cardiomyopathy, CHF, atrial septal defect   OT comments  Pt demonstrating progress toward OT goals this session. She demonstrated the ability to complete [facility]pivot [facility] with min assist and ambulating toilet transfer with fluctuating assistance between min guard and supervision. Additionally, pt demonstrating improved activity tolerance for ADL evidenced by ability to stand at sink to complete oral care task with supervision for safety and ambulate in hallway with up to min guard assist. Noted decreased safety awareness with RW use requiring verbal cues this session. Vital signs stable during all activities. Discussed D/C planning with pt who reports that her daughter is no longer able to stay with her for 24 hour assistance but pt will be able to D/C home with sister. Updated D/C recommendation to home health OT services accordingly. OT will continue to follow while admitted.    Follow Up Recommendations  Supervision/Assistance - 24 hour;Home health OT    Equipment Recommendations  Tub/shower bench    Recommendations for Other Services      Precautions / Restrictions Precautions Precautions: Fall Restrictions Weight Bearing Restrictions: No       Mobility Bed Mobility Overal bed mobility: Needs Assistance Bed Mobility: Rolling;Sit to Sidelying Rolling: Min guard Sidelying to sit: Min guard     Sit to sidelying: Min assist General bed mobility comments: Min guard assist for safety during bed mobility.   Transfers Overall transfer level: Needs assistance Equipment used: Rolling walker (2 wheeled);None Transfers: Sit  to/from Stand Sit to Stand: Min guard;Min assist Stand pivot transfers: Min assist       General transfer comment: Assist to power up to full standing position.     Balance Overall balance assessment: Needs assistance Sitting-balance support: Feet supported Sitting balance-Leahy Scale: Good Sitting balance - Comments: Sitting at EOB with no back support.   Standing balance support: No upper extremity supported Standing balance-Leahy Scale: Fair Standing balance comment: Supervision for safety during static standing tasks.                            ADL either performed or assessed with clinical judgement   ADL Overall ADL's : Needs assistance/impaired     Grooming: Oral care;Supervision/safety;Standing                   Toilet Transfer: Min guard;Stand-pivot;Ambulation;BSC;RW;Minimal assistance Toilet Transfer Details (indicate cue type and reason): Min assist for [facility] as pt needing to urinate quickly. With RW, able to complete ambulating transfer with fluctuating assistance between supervision and min guard.          Functional mobility during ADLs: Min guard;Rolling walker;Minimal assistance General ADL Comments: Pt with some difficulty sequencing tasks and navigating cluttered environment. VC's to utilize RW safely.      Vision       Perception     Praxis      Cognition Arousal/Alertness: Awake/alert Behavior During Therapy: WFL for tasks assessed/performed Overall Cognitive Status: Within Functional Limits for tasks assessed  Exercises     Shoulder Instructions       General Comments VSS     Pertinent Vitals/ Pain       Pain Assessment: Faces Faces Pain Scale: Hurts little more Pain Location: abdomen when donning/doffing socks Pain Descriptors / Indicators: Guarding;Discomfort;Aching Pain Intervention(s): Monitored during session;Repositioned  Home Living                                           Prior Functioning/Environment              Frequency  Min 2X/week        Progress Toward Goals  OT Goals(current goals can now be found in the care plan section)  Progress towards OT goals: Progressing toward goals  Acute Rehab OT Goals Patient Stated Goal: to get back to normal  OT Goal Formulation: With patient Time For Goal Achievement: 02/24/17 Potential to Achieve Goals: Good  Plan Discharge plan needs to be updated    Co-evaluation                 AM-PAC PT "6 Clicks" Daily Activity     Outcome Measure   Help from another person eating meals?: A Little Help from another person taking care of personal grooming?: A Little Help from another person toileting, which includes using toliet, bedpan, or urinal?: A Little Help from another person bathing (including washing, rinsing, drying)?: A Little Help from another person to put on and taking off regular upper body clothing?: A Little Help from another person to put on and taking off regular lower body clothing?: A Little 6 Click Score: 18    End of Session Equipment Utilized During Treatment: Gait belt;Rolling walker  OT Visit Diagnosis: Pain;Unsteadiness on feet (R26.81) Pain - part of body:  (abdomen)   Activity Tolerance Patient tolerated treatment well   Patient Left in chair;with call bell/phone within reach;with family/visitor present   Nurse Communication Mobility status        Time: 4098-1191 OT Time Calculation (min): 41 min  Charges: OT General Charges $OT Visit: 1 Visit OT Treatments $Self Care/Home Management : 38-52 mins  Doristine Section, MS OTR/L  Pager: [phone]    Charlotte Salas 02/16/2017, 1:46 PM

## 2017-02-16 NOTE — NC FL2 (Signed)
[place] MEDICAID FL2 LEVEL OF CARE SCREENING TOOL     IDENTIFICATION  Patient Name: Charlotte Salas Birthdate: 10-04-1945 Sex: female Admission Date (Current Location): 02/09/2017  [facility] and [place] Number:  Producer, television/film/video and Address:  [facility] [place], [address], [place], [place] [address]      Provider Number: [id number]  Attending Physician Name and Address:  Nada Libman, MD  Relative Name and Phone Number:       Current Level of Care: Hospital Recommended Level of Care: Skilled Nursing Facility Prior Approval Number:    Date Approved/Denied:   PASRR Number:    Discharge Plan: SNF    Current Diagnoses: Patient Active Problem List   Diagnosis Date Noted  . AAA (abdominal aortic aneurysm) (HCC) 02/09/2017  . Tobacco abuse 01/28/2012  . CARDIOMYOPATHY, ISCHEMIC 12/22/2009  . CAD, NATIVE VESSEL 01/07/2009  . HYPERLIPIDEMIA 01/01/2009  . HYPERTENSION 01/01/2009  . CERVICAL CANCER, HX OF 01/01/2009  . NON-HODGKIN'S LYMPHOMA, HX OF 01/01/2009  . ATRIAL SEPTAL DEFECT, HX OF 01/01/2009    Orientation RESPIRATION BLADDER Height & Weight     Self, Time, Situation, Place  O2 (2L ) Incontinent, External catheter Weight: 151 lb 8 oz (68.7 kg) Height:  4' 11.5" (151.1 cm)  BEHAVIORAL SYMPTOMS/MOOD NEUROLOGICAL BOWEL NUTRITION STATUS      Continent Diet (carb modified, cardiac)  AMBULATORY STATUS COMMUNICATION OF NEEDS Skin   Limited Assist Verbally Surgical wounds (on abdomen and groin)                       Personal Care Assistance Level of Assistance  Bathing, Dressing Bathing Assistance: Limited assistance   Dressing Assistance: Limited assistance     Functional Limitations Info             SPECIAL CARE FACTORS FREQUENCY  PT (By licensed PT), OT (By licensed OT)     PT Frequency: 5/wk OT Frequency: 5/wk            Contractures      Additional Factors Info  Code Status, Allergies, Insulin Sliding  Scale Code Status Info: FULL Allergies Info: codeine   Insulin Sliding Scale Info: 3/day       Current Medications (02/16/2017):  This is the current hospital active medication list Current Facility-Administered Medications  Medication Dose Route Frequency Provider Last Rate Last Dose  . acetaminophen (TYLENOL) tablet 325-650 mg  325-650 mg Oral Q4H PRN Dara Lords, PA-C   650 mg at 02/15/17 8119   Or  . acetaminophen (TYLENOL) suppository 325-650 mg  325-650 mg Rectal Q4H PRN Rhyne, Samantha J, PA-C      . amiodarone (PACERONE) tablet 200 mg  200 mg Oral Daily Clegg, Amy D, NP   200 mg at 02/16/17 0939  . aspirin EC tablet 81 mg  81 mg Oral Daily Antoine Poche, MD   81 mg at 02/16/17 1478  . atorvastatin (LIPITOR) tablet 10 mg  10 mg Oral q1800 Nada Libman, MD   10 mg at 02/15/17 1731  . bisacodyl (DULCOLAX) suppository 10 mg  10 mg Rectal Daily PRN Rhyne, Ames Coupe, PA-C      . Chlorhexidine Gluconate Cloth 2 % PADS 6 each  6 each Topical Daily Nada Libman, MD   6 each at 02/16/17 0940  . dextrose 5 %-0.45 % sodium chloride infusion   Intravenous Continuous Antoine Poche, MD 10 mL/hr at 02/16/17 0500    .  enoxaparin (LOVENOX) injection 65 mg  65 mg Subcutaneous Q12H Emi HolesMarkle, Jennifer S, RPH   65 mg at 02/16/17 0940  . guaiFENesin-dextromethorphan (ROBITUSSIN DM) 100-10 MG/5ML syrup 15 mL  15 mL Oral Q4H PRN Rhyne, Samantha J, PA-C      . hydrALAZINE (APRESOLINE) injection 5 mg  5 mg Intravenous Q20 Min PRN Rhyne, Samantha J, PA-C      . hydrALAZINE (APRESOLINE) tablet 25 mg  25 mg Oral TID Filbert Schilderlegg, Amy D, NP   25 mg at 02/16/17 0939  . insulin aspart (novoLOG) injection 0-15 Units  0-15 Units Subcutaneous TID WC Rhyne, Samantha J, PA-C   5 Units at 02/16/17 1218  . isosorbide mononitrate (IMDUR) 24 hr tablet 30 mg  30 mg Oral Daily Clegg, Amy D, NP   30 mg at 02/16/17 0939  . morphine 4 MG/ML injection 2 mg  2 mg Intravenous Q1H PRN Maeola Harmanain, Brandon Christopher, MD   2  mg at 02/13/17 1419  . ondansetron (ZOFRAN) injection 4 mg  4 mg Intravenous Q6H PRN Rhyne, Samantha J, PA-C      . oxyCODONE-acetaminophen (PERCOCET/ROXICET) 5-325 MG per tablet 2 tablet  2 tablet Oral Q4H PRN Maeola Harmanain, Brandon Christopher, MD   2 tablet at 02/16/17 0257  . pantoprazole (PROTONIX) EC tablet 40 mg  40 mg Oral Daily Rhyne, Ames CoupeSamantha J, PA-C   40 mg at 02/16/17 [id number]  . phenol (CHLORASEPTIC) mouth spray 1 spray  1 spray Mouth/Throat PRN Dara LordsRhyne, Samantha J, PA-C   1 spray at 02/09/17 2207  . phenylephrine (NEO-SYNEPHRINE) 10 mg in sodium chloride 0.9 % 250 mL (0.04 mg/mL) infusion  0-400 mcg/min Intravenous Titrated Maeola Harmanain, Brandon Christopher, MD   Stopped at 02/12/17 0500  . potassium chloride SA (K-DUR,KLOR-CON) CR tablet 20-40 mEq  20-40 mEq Oral Once PRN Rhyne, Samantha J, PA-C      . sodium chloride flush (NS) 0.9 % injection 10-40 mL  10-40 mL Intracatheter Q12H Nada LibmanBrabham, Jasmyn Picha W, MD   10 mL at 02/15/17 2147  . sodium chloride flush (NS) 0.9 % injection 10-40 mL  10-40 mL Intracatheter PRN Nada LibmanBrabham, Theseus Birnie W, MD      . spironolactone (ALDACTONE) tablet 12.5 mg  12.5 mg Oral Daily Clegg, Amy D, NP   12.5 mg at 02/16/17 [id number]     Discharge Medications: Please see discharge summary for a list of discharge medications.  Relevant Imaging Results:  Relevant Lab Results:   Additional Information SS#: [id number]  Burna SisUris, Jenna H, LCSW

## 2017-02-16 NOTE — Progress Notes (Signed)
Advanced Heart Failure Rounding Note  PCP: Dr Leary Roca  Primary Cardiologist: Dr Clifton James   Subjective:    Remains on milrinone 0.0625 mcg. Yesterday hydralazine and imdur added. Today's CO-OX is 69%.   Feeling better today. Denies SOB/Orthopnea.   Objective:   Weight Range: 151 lb 8 oz (68.7 kg) Body mass index is 30.09 kg/m.   Vital Signs:   Temp:  [97.6 F (36.4 C)-98.1 F (36.7 C)] 97.9 F (36.6 C) (09/05 0700) Pulse Rate:  [57-71] 60 (09/05 0700) Resp:  [8-23] 12 (09/05 0700) BP: (130-171)/(47-95) 152/66 (09/05 0600) SpO2:  [90 %-100 %] 95 % (09/05 0700) Weight:  [146 lb 1.6 oz (66.3 kg)-151 lb 8 oz (68.7 kg)] 151 lb 8 oz (68.7 kg) (09/05 0400) Last BM Date: 02/16/17  Weight change: Filed Weights   02/15/17 0500 02/15/17 0850 02/16/17 0400  Weight: 149 lb 14.6 oz (68 kg) 146 lb 1.6 oz (66.3 kg) 151 lb 8 oz (68.7 kg)    Intake/Output:   Intake/Output Summary (Last 24 hours) at 02/16/17 0804 Last data filed at 02/16/17 0500  Gross per 24 hour  Intake            895.2 ml  Output             1310 ml  Net           -414.8 ml      Physical Exam   CVP 5 General: . No resp difficulty. Sitting in the chair HEENT: Normal Neck: Supple. JVP 5-6 Carotids 2+ bilat; no bruits. No lymphadenopathy or thyromegaly appreciated. Cor: PMI nondisplaced. Regular rate & rhythm. No rubs, gallops or murmurs. Lungs: Clear on room air Abdomen: Soft, nontender, nondistended. No hepatosplenomegaly. No bruits or masses. Good bowel sounds. Extremities: No cyanosis, clubbing, rash, edema. R and L groin vac in place Neuro: Alert & orientedx3, cranial nerves grossly intact. moves all 4 extremities w/o difficulty. Affect pleasant   Telemetry   NSR 50-60 personally reviewed.   EKG    A fib 93 bpm on 02/09/2017   Labs    CBC  Recent Labs  02/13/17 2010 02/15/17 0410  WBC 14.3* 12.3*  NEUTROABS 11.0*  --   HGB 10.6* 10.2*  HCT 32.6* 31.0*  MCV 88.1 87.3  PLT 205 216     Basic Metabolic Panel  Recent Labs  02/15/17 0410 02/16/17 0433  NA 133* 134*  K 3.3* 3.7  CL 98* 99*  CO2 26 29  GLUCOSE 121* 114*  BUN 38* 26*  CREATININE 1.58* 1.25*  CALCIUM 7.6* 7.8*   Liver Function Tests No results for input(s): AST, ALT, ALKPHOS, BILITOT, PROT, ALBUMIN in the last 72 hours. No results for input(s): LIPASE, AMYLASE in the last 72 hours. Cardiac Enzymes  Recent Labs  02/14/17 0949 02/14/17 1543 02/14/17 2134  TROPONINI 2.19* 2.03* 1.90*    BNP: BNP (last 3 results)  Recent Labs  02/14/17 0948  BNP 1,788.7*    ProBNP (last 3 results) No results for input(s): PROBNP in the last 8760 hours.   D-Dimer No results for input(s): DDIMER in the last 72 hours. Hemoglobin A1C No results for input(s): HGBA1C in the last 72 hours. Fasting Lipid Panel No results for input(s): CHOL, HDL, LDLCALC, TRIG, CHOLHDL, LDLDIRECT in the last 72 hours. Thyroid Function Tests No results for input(s): TSH, T4TOTAL, T3FREE, THYROIDAB in the last 72 hours.  Invalid input(s): FREET3  Other results:   Imaging     No results found.  Medications:     Scheduled Medications: . amiodarone  200 mg Oral BID  . aspirin EC  81 mg Oral Daily  . atorvastatin  10 mg Oral q1800  . chlorhexidine  15 mL Mouth Rinse BID  . Chlorhexidine Gluconate Cloth  6 each Topical Daily  . enoxaparin (LOVENOX) injection  65 mg Subcutaneous Q24H  . hydrALAZINE  12.5 mg Oral TID  . insulin aspart  0-15 Units Subcutaneous TID WC  . isosorbide mononitrate  15 mg Oral Daily  . mouth rinse  15 mL Mouth Rinse q12n4p  . pantoprazole  40 mg Oral Daily  . sodium chloride flush  10-40 mL Intracatheter Q12H     Infusions: . dextrose 5 % and 0.45% NaCl 10 mL/hr at 02/16/17 0500  . milrinone 0.0625 mcg/kg/min (02/16/17 0500)  . phenylephrine (NEO-SYNEPHRINE) Adult infusion Stopped (02/12/17 0500)     PRN Medications:  acetaminophen **OR** acetaminophen, bisacodyl,  guaiFENesin-dextromethorphan, hydrALAZINE, morphine injection, ondansetron, oxyCODONE-acetaminophen, phenol, potassium chloride, sodium chloride flush    Patient Profile   Ms Rounds is a 82 year with  a history of ICM, CAD, S/P RCA BMS 2001, S/P LAD BMS 2009 RCA was occluded with collaterals, non-hodgkins lymphoma (completed chemo in 2001), HTN, hyperlipidemia, PAD, smoker, smoker, DMII, and cervical cancer.   Admitted with Aorto Bifem. Post op course complicated by acute respiratory failure and a/c systolic heart failure.    Assessment/Plan  1. PAD- S/P Aorto Bi Fem 8/29 with bilateral vac R and L groin 2. Acute Respiratory Failure - Sats down to the 70s on 9/3. Improved with IV lasix. Oxygen weaned to room air today. Stable.  3. A/C Systolic Heart Failure- ICM. ECHO this admit with EF 30-35%.  (Bensimhon reviewed EF 25%) Yesterday CO-OX 47% . Started on milrinone 0.0625 mcg. Todays CO-OX is 69%. Stop milrinone.  Considered digoxin but heart rate has been on the low side.  CVP 4. Stop IV lasix. Add 12.5 mg spiro daily.  BMET in am.  Hold off on bb. Increase hydralazine to 25 mg tid and increase imdur to 30 mg daily.  4. A fib RVR- started on IV amio. During amio load had pauses. Cut back amio to 200 mg daily.  Converted to NSR. On lovenox. Hold off on apixaban for now.   5. AKI- creatinine on admit 1.0. Creatinine peaked at 1.78. Creatinine trending down to 1.25. Hold off on arb. Add low dose spiro. Watch renal function.  6.CAD- BMS RCA 2001 and BMS LAD 2009. Had elevated troponin 2.2>2.0>1.9. N CP. Marland Kitchen On statin. Will need LHC prior to discharge.  Set up for tomorrow.  7. Smoker - discussed smoking cessation.  8. H/O non hodgkins lymphoma  2001 treated with chemo.  9. DMII- on sliding scale. Off metformin with elevated creatinine.   Set up LHC in the next day or so.    Length of Stay: 7   Amy Clegg, NP  02/16/2017, 8:04 AM  Advanced Heart Failure Team Pager [phone] (M-F; 7a -  4p)  Please contact [facility] for night-coverage after hours (4p -7a ) and weekends on [email or web address]  Patient seen and examined with Tonye Becket, NP. We discussed all aspects of the encounter. I agree with the assessment and plan as stated above.   Remains weak but breathing better. Co-ox looks good on low-dose milrinone support. Agree with stopping. CVP down to 4 -> can switch to po lasix. No b-blocker yet with low output. Continue hydral/nitrates. Will switch to ARB/ARNI post-cath. Likely  R/L cath on Friday.  Can likely stop amio soon.   [name], Daniel, MD  9:19 PM

## 2017-02-17 DIAGNOSIS — I739 Peripheral vascular disease, unspecified: Secondary | ICD-10-CM

## 2017-02-17 LAB — COOXEMETRY PANEL
CARBOXYHEMOGLOBIN: 1.8 % — AB (ref 0.5–1.5)
Methemoglobin: 0.9 % (ref 0.0–1.5)
O2 SAT: 65.9 %
Total hemoglobin: 11 g/dL — ABNORMAL LOW (ref 12.0–16.0)

## 2017-02-17 LAB — GLUCOSE, CAPILLARY
GLUCOSE-CAPILLARY: 144 mg/dL — AB (ref 65–99)
GLUCOSE-CAPILLARY: 172 mg/dL — AB (ref 65–99)
Glucose-Capillary: 162 mg/dL — ABNORMAL HIGH (ref 65–99)
Glucose-Capillary: 144 mg/dL — ABNORMAL HIGH (ref 65–99)

## 2017-02-17 LAB — BASIC METABOLIC PANEL
Anion gap: 7 (ref 5–15)
BUN: 15 mg/dL (ref 6–20)
CALCIUM: 7.8 mg/dL — AB (ref 8.9–10.3)
CHLORIDE: 99 mmol/L — AB (ref 101–111)
CO2: 29 mmol/L (ref 22–32)
CREATININE: 0.94 mg/dL (ref 0.44–1.00)
GFR calc Af Amer: >60 mL/min (ref >60)
GFR calc non Af Amer: 59 mL/min — ABNORMAL LOW (ref >60)
GLUCOSE: 96 mg/dL (ref 65–99)
Potassium: 3.6 mmol/L (ref 3.5–5.1)
Sodium: 135 mmol/L (ref 135–145)

## 2017-02-17 MED ORDER — ASPIRIN 81 MG PO CHEW
81.0000 mg | CHEWABLE_TABLET | ORAL | Status: AC
  Administered 2017-02-18: 81 mg via ORAL
  Filled 2017-02-17: qty 1

## 2017-02-17 MED ORDER — LOSARTAN POTASSIUM 25 MG PO TABS: 25.0000 mg | ORAL_TABLET | Freq: Every day | ORAL | Status: DC

## 2017-02-17 MED ORDER — SACUBITRIL-VALSARTAN 24-26 MG PO TABS
1.0000 | ORAL_TABLET | Freq: Two times a day (BID) | ORAL | Status: DC
  Administered 2017-02-17 – 2017-02-19 (×5): 1 via ORAL
  Filled 2017-02-17 (×6): qty 1

## 2017-02-17 NOTE — Progress Notes (Signed)
Advanced Heart Failure Rounding Note  PCP: Dr Leary RocaMahoney  Primary Cardiologist: Dr Clifton JamesMcAlhany   Subjective:    Yesterday milrinone stopped and spiro was added.   Overall feeling much better. Denies SOB. Maintaining NSR. Wounds healing.     Objective:   Weight Range: 145 lb 8.1 oz (66 kg) Body mass index is 28.9 kg/m.   Vital Signs:   Temp:  [97.9 F (36.6 C)-98.3 F (36.8 C)] 97.9 F (36.6 C) (09/06 0755) Pulse Rate:  [51-88] 81 (09/06 0700) Resp:  [10-23] 22 (09/06 0700) BP: (127-169)/(40-76) 169/73 (09/06 0700) SpO2:  [92 %-99 %] 92 % (09/06 0700) Weight:  [145 lb 8.1 oz (66 kg)] 145 lb 8.1 oz (66 kg) (09/06 0500) Last BM Date: 02/16/17  Weight change: Filed Weights   02/15/17 0850 02/16/17 0400 02/17/17 0500  Weight: 146 lb 1.6 oz (66.3 kg) 151 lb 8 oz (68.7 kg) 145 lb 8.1 oz (66 kg)    Intake/Output:   Intake/Output Summary (Last 24 hours) at 02/17/17 0845 Last data filed at 02/17/17 0500  Gross per 24 hour  Intake            242.4 ml  Output              200 ml  Net             42.4 ml      Physical Exam   CVP 4.  General:  Well appearing. No resp difficulty. Sitting in the chair HEENT: normal Neck: supple. no JVD. Carotids 2+ bilat; no bruits. No lymphadenopathy or thryomegaly appreciated. Cor: PMI nondisplaced. Regular rate & rhythm. No rubs, gallops or murmurs. Lungs: clear Abdomen: soft, nontender, nondistended. No hepatosplenomegaly. No bruits or masses. Good bowel sounds. Extremities: no cyanosis, clubbing, rash, edema.  R and L groin incisions approximated.  Neuro: alert & orientedx3, cranial nerves grossly intact. moves all 4 extremities w/o difficulty. Affect pleasant    Telemetry   NSR 50-60s personally reviewed.   EKG    A fib 93 bpm on 02/09/2017   Labs    CBC  Recent Labs  02/15/17 0410  WBC 12.3*  HGB 10.2*  HCT 31.0*  MCV 87.3  PLT 216   Basic Metabolic Panel  Recent Labs  02/16/17 0433 02/17/17 0336  NA 134*  135  K 3.7 3.6  CL 99* 99*  CO2 29 29  GLUCOSE 114* 96  BUN 26* 15  CREATININE 1.25* 0.94  CALCIUM 7.8* 7.8*   Liver Function Tests No results for input(s): AST, ALT, ALKPHOS, BILITOT, PROT, ALBUMIN in the last 72 hours. No results for input(s): LIPASE, AMYLASE in the last 72 hours. Cardiac Enzymes  Recent Labs  02/14/17 0949 02/14/17 1543 02/14/17 2134  TROPONINI 2.19* 2.03* 1.90*    BNP: BNP (last 3 results)  Recent Labs  02/14/17 0948  BNP 1,788.7*    ProBNP (last 3 results) No results for input(s): PROBNP in the last 8760 hours.   D-Dimer No results for input(s): DDIMER in the last 72 hours. Hemoglobin A1C No results for input(s): HGBA1C in the last 72 hours. Fasting Lipid Panel No results for input(s): CHOL, HDL, LDLCALC, TRIG, CHOLHDL, LDLDIRECT in the last 72 hours. Thyroid Function Tests No results for input(s): TSH, T4TOTAL, T3FREE, THYROIDAB in the last 72 hours.  Invalid input(s): FREET3  Other results:   Imaging    No results found.   Medications:     Scheduled Medications: . amiodarone  200 mg Oral  Daily  . aspirin EC  81 mg Oral Daily  . atorvastatin  10 mg Oral q1800  . Chlorhexidine Gluconate Cloth  6 each Topical Daily  . enoxaparin (LOVENOX) injection  65 mg Subcutaneous Q12H  . hydrALAZINE  25 mg Oral TID  . insulin aspart  0-15 Units Subcutaneous TID WC  . isosorbide mononitrate  30 mg Oral Daily  . pantoprazole  40 mg Oral Daily  . sodium chloride flush  10-40 mL Intracatheter Q12H  . spironolactone  12.5 mg Oral Daily    Infusions: . dextrose 5 % and 0.45% NaCl Stopped (02/16/17 1340)  . phenylephrine (NEO-SYNEPHRINE) Adult infusion Stopped (02/12/17 0500)    PRN Medications: acetaminophen **OR** acetaminophen, bisacodyl, guaiFENesin-dextromethorphan, hydrALAZINE, morphine injection, ondansetron, oxyCODONE-acetaminophen, phenol, potassium chloride, sodium chloride flush    Patient Profile   Ms Yusko is a 71  year with  a history of ICM, CAD, S/P RCA BMS 2001, S/P LAD BMS 2009 RCA was occluded with collaterals, non-hodgkins lymphoma (completed chemo in 2001), HTN, hyperlipidemia, PAD, smoker, smoker, DMII, and cervical cancer.   Admitted with Aorto Bifem. Post op course complicated by acute respiratory failure and a/c systolic heart failure.    Assessment/Plan  1. PAD- S/P Aorto Bi Fem 8/29 with bilateral vac R and L groin 2. Acute Respiratory Failure - Sats down to the 70s on 9/3. Improved with IV lasix.  Resolved on room air.   3. A/C Systolic Heart Failure- ICM. ECHO this admit with EF 30-35%.  (Calyn Rubi reviewed EF 25%) Initial CO-OX Yesterday CO-OX 47% .  Stable off milrinone. Todays CO-OX is 66%.  Hold off on bb.  Stop hydralazine/imdur Start entresto 24-26  Stop cvp and CO-OX  4. A fib RVR- started on IV amio. During amio load had pauses. Continue amio 200 mg daily today. If remains in NSR will stop amio tomorrow 5. AKI- creatinine on admit 1.0. Creatinine peaked at 1.78. Creatinine trending down to 0.9. Restart arni.  6.CAD- BMS RCA 2001 and BMS LAD 2009. Had elevated troponin 2.2>2.0>1.9. No CP.  On statin. LHC tomorrow.   7. Smoker - discussed smoking cessation.  8. H/O non hodgkins lymphoma  2001 treated with chemo.  9. DMII- on sliding scale. Off metformin with elevated creatinine. Can restart after cath.   LHC/RHC tomorrow .  Ok to move out of ICU today.     Length of Stay: 8   Amy Clegg, NP  02/17/2017, 8:45 AM  Advanced Heart Failure Team Pager [phone] (M-F; 7a - 4p)  Please contact [facility] for night-coverage after hours (4p -7a ) and weekends on [email or web address]  Patient seen and examined with Amy Clegg, NP. We discussed all aspects of the encounter. I agree with the assessment and plan as stated above.   Improving post-op. HF stable. Maintaining NSR. Plan R/L heart cath tomorrow. Can stop amio and lovenox as post-op AF has resolved. Can go to tele. Start  Entresto. Continue to hold metformin for cath.   Rody Keadle, MD  9:35 AM    

## 2017-02-17 NOTE — Progress Notes (Signed)
02/17/2017 1815 Received transfer to room 4E06 from 2H.  Pt is A&O, no requests at this time.  Tele monitor applied and CCMD notified.  Oriented to room, call light and bed.  Call bell in reach. Kathryne HitchAllen, [place] Gipe C

## 2017-02-17 NOTE — Progress Notes (Addendum)
  AAA Progress Note    02/17/2017 7:14 AM 8 Days Post-Op  Subjective:  Says she feels rank and wants to take a shower.  Says she walked twice yesterday and once this morning.  Says her family was contacted and there is a bed available in [place].  Afebrile HR  50's-80's NSR 120's-160's systolic 96% RA  Gtts:  Milrinone is off  Vitals:   02/17/17 0600 02/17/17 0700  BP: 127/76 (!) 169/73  Pulse: 88 81  Resp: (!) 23 (!) 22  Temp:    SpO2: 99% 92%    Physical Exam: Cardiac:  regular Lungs:  Clear to auscultation at bases posteriorly Abdomen:  Soft, NT/ND; +flatus; +BM Incisions:  Midline and bilateral groin incisions are healing nicely.  Extremities:  Bilateral feet warm with palpable DP pulses bilaterally  CBC    Component Value Date/Time   WBC 12.3 (H) 02/15/2017 0410   RBC 3.55 (L) 02/15/2017 0410   HGB 10.2 (L) 02/15/2017 0410   HCT 31.0 (L) 02/15/2017 0410   PLT 216 02/15/2017 0410   MCV 87.3 02/15/2017 0410   MCH 28.7 02/15/2017 0410   MCHC 32.9 02/15/2017 0410   RDW 14.1 02/15/2017 0410   LYMPHSABS 2.0 02/13/2017 2010   MONOABS 1.3 (H) 02/13/2017 2010   EOSABS 0.0 02/13/2017 2010   BASOSABS 0.0 02/13/2017 2010    BMET    Component Value Date/Time   NA 135 02/17/2017 0336   K 3.6 02/17/2017 0336   CL 99 (L) 02/17/2017 0336   CO2 29 02/17/2017 0336   GLUCOSE 96 02/17/2017 0336   BUN 15 02/17/2017 0336   CREATININE 0.94 02/17/2017 0336   CALCIUM 7.8 (L) 02/17/2017 0336   GFRNONAA 59 (L) 02/17/2017 0336   GFRAA >60 02/17/2017 0336    INR    Component Value Date/Time   INR 1.32 02/09/2017 1530     Intake/Output Summary (Last 24 hours) at 02/17/17 0714 Last data filed at 02/17/17 0500  Gross per 24 hour  Intake            243.6 ml  Output              200 ml  Net             43.6 ml     Assessment/Plan:  71 y.o. female is s/p  Aortobifemoral bypass grafting 8 Days Post-Op  -CV: continues to be in NSR.  Cards holding Eliquis for now.   Milrinone off--pt oxygen sats in the 90's on RA. Pulmonary:  Lungs clear at bases posteriorly-96% RA Renal:  Renal function continues to improve.  Will hold restarting Metformin as cards planning left and right heart cath on Friday. -Pravena vacs removed yesterday-wounds healing nicely.  Continue dry gauze to bilateral groins when sitting to help wick moisture to help prevent wound infection.   -mobilize-pt walked 2x yesterday and once today (a little further today).   -plan for rehab for a couple of weeks then she will stay with her sister for a couple of weeks.  -possible transfer to [facility] today.   [place] MassedSamantha Rhyne, PA-C [facility] [phone] 02/17/2017 7:14 AM  Agree with the above.  I have seen and examined the patient.  For cardiac cath soon.[place].  Ok to [place]hab [place] once cleared by cardiology.  Dr. Darrick [place] to see Friday and over the weekend.  [place]

## 2017-02-18 ENCOUNTER — Inpatient Hospital Stay (HOSPITAL_COMMUNITY)
Admission: RE | Disposition: A | Discharge status: Skilled Nursing Facility | Payer: Self-pay | Source: Ambulatory Visit | Attending: Surgery

## 2017-02-18 ENCOUNTER — Encounter (HOSPITAL_COMMUNITY): Payer: Self-pay | Admitting: Internal Medicine

## 2017-02-18 ENCOUNTER — Telehealth: Payer: Self-pay | Admitting: Surgery

## 2017-02-18 DIAGNOSIS — I480 Paroxysmal atrial fibrillation: Secondary | ICD-10-CM

## 2017-02-18 DIAGNOSIS — I5023 Acute on chronic systolic (congestive) heart failure: Secondary | ICD-10-CM

## 2017-02-18 HISTORY — PX: RIGHT HEART CATH: CATH118263

## 2017-02-18 LAB — BASIC METABOLIC PANEL
ANION GAP: 7 (ref 5–15)
BUN: 14 mg/dL (ref 6–20)
CALCIUM: 8 mg/dL — AB (ref 8.9–10.3)
CO2: 26 mmol/L (ref 22–32)
CREATININE: 0.91 mg/dL (ref 0.44–1.00)
Chloride: 101 mmol/L (ref 101–111)
Glucose, Bld: 111 mg/dL — ABNORMAL HIGH (ref 65–99)
Potassium: 4.4 mmol/L (ref 3.5–5.1)
Sodium: 134 mmol/L — ABNORMAL LOW (ref 135–145)

## 2017-02-18 LAB — POCT I-STAT 3, VENOUS BLOOD GAS (G3P V)
Acid-Base Excess: 4 mmol/L — ABNORMAL HIGH (ref 0.0–2.0)
Acid-Base Excess: 5 mmol/L — ABNORMAL HIGH (ref 0.0–2.0)
BICARBONATE: 29 mmol/L — AB (ref 20.0–28.0)
BICARBONATE: 29.5 mmol/L — AB (ref 20.0–28.0)
O2 Saturation: 72 %
O2 Saturation: 74 %
PCO2 VEN: 43.2 mmHg — AB (ref 44.0–60.0)
PH VEN: 7.435 — AB (ref 7.250–7.430)
PO2 VEN: 37 mmHg (ref 32.0–45.0)
PO2 VEN: 38 mmHg (ref 32.0–45.0)
TCO2: 30 mmol/L (ref 22–32)
TCO2: 31 mmol/L (ref 22–32)
pCO2, Ven: 43 mmHg — ABNORMAL LOW (ref 44.0–60.0)
pH, Ven: 7.444 — ABNORMAL HIGH (ref 7.250–7.430)

## 2017-02-18 LAB — GLUCOSE, CAPILLARY
GLUCOSE-CAPILLARY: 122 mg/dL — AB (ref 65–99)
GLUCOSE-CAPILLARY: 118 mg/dL — AB (ref 65–99)
GLUCOSE-CAPILLARY: 203 mg/dL — AB (ref 65–99)
Glucose-Capillary: 166 mg/dL — ABNORMAL HIGH (ref 65–99)

## 2017-02-18 LAB — CREATININE, SERUM
Creatinine, Ser: 1.02 mg/dL — ABNORMAL HIGH (ref 0.44–1.00)
GFR calc Af Amer: >60 mL/min (ref >60)
GFR calc non Af Amer: 53 mL/min — ABNORMAL LOW (ref >60)

## 2017-02-18 LAB — CBC
HEMATOCRIT: 33.4 % — AB (ref 36.0–46.0)
HEMOGLOBIN: 10.7 g/dL — AB (ref 12.0–15.0)
MCH: 28.8 pg (ref 26.0–34.0)
MCHC: 32 g/dL (ref 30.0–36.0)
MCV: 89.8 fL (ref 78.0–100.0)
Platelets: 368 10*3/uL (ref 150–400)
RBC: 3.72 MIL/uL — AB (ref 3.87–5.11)
RDW: 14.7 % (ref 11.5–15.5)
WBC: 11.3 10*3/uL — ABNORMAL HIGH (ref 4.0–10.5)

## 2017-02-18 SURGERY — RIGHT HEART CATH
Anesthesia: LOCAL

## 2017-02-18 MED ORDER — LIDOCAINE HCL (PF) 1 % IJ SOLN
INTRAMUSCULAR | Status: DC | PRN
  Administered 2017-02-18 (×4): 2 mL

## 2017-02-18 MED ORDER — CARVEDILOL 3.125 MG PO TABS: 3.1250 mg | ORAL_TABLET | Freq: Two times a day (BID) | ORAL | 6 refills | Status: DC

## 2017-02-18 MED ORDER — FENTANYL CITRATE (PF) 100 MCG/2ML IJ SOLN
INTRAMUSCULAR | Status: DC | PRN
  Administered 2017-02-18 (×2): 25 ug via INTRAVENOUS

## 2017-02-18 MED ORDER — SODIUM CHLORIDE 0.9 % IV SOLN
INTRAVENOUS | Status: DC
  Administered 2017-02-18: 250 mL via INTRAVENOUS

## 2017-02-18 MED ORDER — ACETAMINOPHEN 325 MG PO TABS: 650.0000 mg | ORAL_TABLET | ORAL | Status: DC | PRN

## 2017-02-18 MED ORDER — SODIUM CHLORIDE 0.9% FLUSH
3.0000 mL | Freq: Two times a day (BID) | INTRAVENOUS | Status: DC
  Administered 2017-02-18 – 2017-02-19 (×2): 3 mL via INTRAVENOUS

## 2017-02-18 MED ORDER — ONDANSETRON HCL 4 MG/2ML IJ SOLN: 4.0000 mg | Freq: Four times a day (QID) | INTRAMUSCULAR | Status: DC | PRN

## 2017-02-18 MED ORDER — SODIUM CHLORIDE 0.9% FLUSH: 3.0000 mL | Freq: Two times a day (BID) | INTRAVENOUS | Status: DC

## 2017-02-18 MED ORDER — CARVEDILOL 3.125 MG PO TABS
3.1250 mg | ORAL_TABLET | Freq: Two times a day (BID) | ORAL | Status: DC
  Administered 2017-02-18 – 2017-02-19 (×2): 3.125 mg via ORAL
  Filled 2017-02-18 (×2): qty 1

## 2017-02-18 MED ORDER — VERAPAMIL HCL 2.5 MG/ML IV SOLN
INTRAVENOUS | Status: AC
  Filled 2017-02-18: qty 2

## 2017-02-18 MED ORDER — HEPARIN (PORCINE) IN NACL 2-0.9 UNIT/ML-% IJ SOLN
INTRAMUSCULAR | Status: DC | PRN
  Administered 2017-02-18: 08:00:00

## 2017-02-18 MED ORDER — FENTANYL CITRATE (PF) 100 MCG/2ML IJ SOLN
INTRAMUSCULAR | Status: AC
  Filled 2017-02-18: qty 2

## 2017-02-18 MED ORDER — LIDOCAINE HCL (PF) 1 % IJ SOLN
INTRAMUSCULAR | Status: AC
  Filled 2017-02-18: qty 30

## 2017-02-18 MED ORDER — SODIUM CHLORIDE 0.9% FLUSH: 3.0000 mL | INTRAVENOUS | Status: DC | PRN

## 2017-02-18 MED ORDER — SODIUM CHLORIDE 0.9 % IV SOLN: 250.0000 mL | INTRAVENOUS | Status: DC | PRN

## 2017-02-18 MED ORDER — MIDAZOLAM HCL 2 MG/2ML IJ SOLN
INTRAMUSCULAR | Status: AC
  Filled 2017-02-18: qty 2

## 2017-02-18 MED ORDER — HEPARIN (PORCINE) IN NACL 2-0.9 UNIT/ML-% IJ SOLN
INTRAMUSCULAR | Status: AC
  Filled 2017-02-18: qty 1000

## 2017-02-18 MED ORDER — TRAMADOL HCL 50 MG PO TABS: 50.0000 mg | ORAL_TABLET | Freq: Four times a day (QID) | ORAL | 0 refills | Status: DC | PRN

## 2017-02-18 MED ORDER — HEPARIN SODIUM (PORCINE) 1000 UNIT/ML IJ SOLN
INTRAMUSCULAR | Status: AC
  Filled 2017-02-18: qty 1

## 2017-02-18 MED ORDER — SODIUM CHLORIDE 0.9 % IV SOLN
250.0000 mL | INTRAVENOUS | Status: DC | PRN
  Administered 2017-02-18: 250 mL via INTRAVENOUS

## 2017-02-18 MED ORDER — ENOXAPARIN SODIUM 40 MG/0.4ML ~~LOC~~ SOLN
40.0000 mg | SUBCUTANEOUS | Status: DC
  Administered 2017-02-19: 40 mg via SUBCUTANEOUS
  Filled 2017-02-18: qty 0.4

## 2017-02-18 MED ORDER — MIDAZOLAM HCL 2 MG/2ML IJ SOLN
INTRAMUSCULAR | Status: DC | PRN
  Administered 2017-02-18: 1 mg via INTRAVENOUS

## 2017-02-18 MED ORDER — IOPAMIDOL (ISOVUE-370) INJECTION 76%
INTRAVENOUS | Status: AC
  Filled 2017-02-18: qty 100

## 2017-02-18 MED ORDER — SPIRONOLACTONE 25 MG PO TABS: 12.5000 mg | ORAL_TABLET | Freq: Every day | ORAL | 6 refills | Status: DC

## 2017-02-18 MED ORDER — SACUBITRIL-VALSARTAN 24-26 MG PO TABS: 1.0000 | ORAL_TABLET | Freq: Two times a day (BID) | ORAL | 6 refills | Status: DC

## 2017-02-18 SURGICAL SUPPLY — 15 items
CATH BALLN WEDGE 5F 110CM (CATHETERS) ×2 IMPLANT
COVER PRB 48X5XTLSCP FOLD TPE (BAG) ×1 IMPLANT
COVER PROBE 5X48 (BAG) ×1
DEVICE RAD COMP TR BAND LRG (VASCULAR PRODUCTS) ×2 IMPLANT
GLIDESHEATH SLEND SS 6F .021 (SHEATH) ×2 IMPLANT
GUIDEWIRE .025 260CM (WIRE) ×2 IMPLANT
GUIDEWIRE INQWIRE 1.5J.035X260 (WIRE) ×1 IMPLANT
HOVERMATT SINGLE USE (MISCELLANEOUS) ×2 IMPLANT
INQWIRE 1.5J .035X260CM (WIRE) ×2
KIT HEART LEFT (KITS) ×2 IMPLANT
KIT MICROINTRODUCER STIFF 5F (SHEATH) ×6 IMPLANT
PACK CARDIAC CATHETERIZATION (CUSTOM PROCEDURE TRAY) ×2 IMPLANT
SHEATH GLIDE SLENDER 4/5FR (SHEATH) ×4 IMPLANT
TRANSDUCER W/STOPCOCK (MISCELLANEOUS) ×2 IMPLANT
TUBING CIL FLEX 10 FLL-RA (TUBING) ×2 IMPLANT

## 2017-02-18 NOTE — Progress Notes (Signed)
PT Cancellation Note  Patient Details Name: [place] MRN: [id number] DOB: Oct 17, 1945   Cancelled Treatment:    Reason Eval/Treat Not Completed: Patient at procedure or test/unavailable Pt in cath lab. PT will follow as able.  Anette Barra B. Beverely RisenVan Fleet PT, DPT Acute Rehabilitation  [phone] Pager [phone]  [name] 02/18/2017, 8:19 AM

## 2017-02-18 NOTE — [place] FL2 (Signed)
[place] MEDICAID [id number] LEVEL OF CARE SCREENING TOOL     IDENTIFICATION  Patient Name: [place] Birthdate: September 02, 1945 Sex: female Admission Date (Current Location): 02/09/2017  [facility] [place]   Facility and Address:  [facility], [address], [place], [place] [address]      Provider Number: [id number]  Attending Physician Name and Address:  Nada LibmanBrabham, Vance W, MD  Relative Name and Phone Number:       Current Level of Care: Hospital Recommended Level of Care: Skilled Nursing Facility Prior Approval Number:    Date Approved/Denied:   PASRR Number:    Discharge Plan: SNF    Current Diagnoses: Patient Active Problem List   Diagnosis Date Noted  . Acute systolic heart failure (HCC)   . PAF (paroxysmal atrial fibrillation) (HCC)   . AAA (abdominal aortic aneurysm) (HCC) 02/09/2017  . Tobacco abuse 01/28/2012  . CARDIOMYOPATHY, ISCHEMIC 12/22/2009  . CAD in native artery 01/07/2009  . HYPERLIPIDEMIA 01/01/2009  . HYPERTENSION 01/01/2009  . CERVICAL CANCER, HX OF 01/01/2009  . NON-HODGKIN'S LYMPHOMA, HX OF 01/01/2009  . ATRIAL SEPTAL DEFECT, HX OF 01/01/2009    Orientation RESPIRATION BLADDER Height & Weight     Self, Time, Situation, Place  O2 (2L [place]) Incontinent, External catheter Weight: 146 lb 9.6 oz (66.5 kg) Height:  4' 11.5" (151.1 cm)  BEHAVIORAL SYMPTOMS/MOOD NEUROLOGICAL BOWEL NUTRITION STATUS      Continent Diet (carb modified, cardiac)  AMBULATORY STATUS COMMUNICATION OF NEEDS Skin   Limited Assist Verbally Surgical wounds (on abdomen and groin)                       Personal Care Assistance Level of Assistance  Bathing, Dressing Bathing Assistance: Limited assistance   Dressing Assistance: Limited assistance     Functional Limitations Info             SPECIAL CARE FACTORS FREQUENCY  PT (By licensed PT), OT (By licensed OT)     PT Frequency: 5/wk OT Frequency: 5/wk             Contractures      Additional Factors Info  Code Status, Allergies, Insulin Sliding Scale Code Status Info: FULL Allergies Info: codeine   Insulin Sliding Scale Info: 3/day       Current Medications (02/18/2017):  This is the current hospital active medication list Current Facility-Administered Medications  Medication Dose Route Frequency Provider Last Rate Last Dose  . 0.9 %  sodium chloride infusion  250 mL Intravenous PRN Bensimhon, Bevelyn Bucklesaniel R, MD      . acetaminophen (TYLENOL) tablet 325-650 mg  325-650 mg Oral Q4H PRN Dara LordsRhyne, Samantha J, PA-C   650 mg at 02/15/17 [id number]   Or  . acetaminophen (TYLENOL) suppository 325-650 mg  325-650 mg Rectal Q4H PRN Rhyne, Samantha J, PA-C      . acetaminophen (TYLENOL) tablet 650 mg  650 mg Oral Q4H PRN Bensimhon, Bevelyn Bucklesaniel R, MD      . aspirin EC tablet 81 mg  81 mg Oral Daily Antoine PocheBranch, Jonathan F, MD   81 mg at 02/18/17 1151  . atorvastatin (LIPITOR) tablet 10 mg  10 mg Oral q1800 Nada LibmanBrabham, Vance W, MD   10 mg at 02/17/17 1752  . bisacodyl (DULCOLAX) suppository 10 mg  10 mg Rectal Daily PRN Rhyne, Samantha J, PA-C      . carvedilol (COREG) tablet 3.125 mg  3.125 mg Oral BID WC Clegg,  Amy D, NP      . dextrose 5 %-0.45 % sodium chloride infusion   Intravenous Continuous Wyline Mood Dorothe Pea, MD   Stopped at 02/16/17 1340  . [START ON 02/19/2017] enoxaparin (LOVENOX) injection 40 mg  40 mg Subcutaneous Q24H Bensimhon, Bevelyn Buckles, MD      . guaiFENesin-dextromethorphan (ROBITUSSIN DM) 100-10 MG/5ML syrup 15 mL  15 mL Oral Q4H PRN Rhyne, Samantha J, PA-C      . hydrALAZINE (APRESOLINE) injection 5 mg  5 mg Intravenous Q20 Min PRN Rhyne, Samantha J, PA-C      . insulin aspart (novoLOG) injection 0-15 Units  0-15 Units Subcutaneous TID WC Rhyne, Samantha J, PA-C   3 Units at 02/17/17 1752  . morphine 4 MG/ML injection 2 mg  2 mg Intravenous Q1H PRN Maeola Harman, MD   2 mg at 02/13/17 1419  . ondansetron (ZOFRAN) injection 4 mg  4 mg Intravenous Q6H  PRN Rhyne, Samantha J, PA-C      . ondansetron (ZOFRAN) injection 4 mg  4 mg Intravenous Q6H PRN Bensimhon, Bevelyn Buckles, MD      . oxyCODONE-acetaminophen (PERCOCET/ROXICET) 5-325 MG per tablet 2 tablet  2 tablet Oral Q4H PRN Maeola Harman, MD   2 tablet at 02/18/17 1217  . pantoprazole (PROTONIX) EC tablet 40 mg  40 mg Oral Daily Rhyne, Samantha J, PA-C   40 mg at 02/18/17 1150  . phenol (CHLORASEPTIC) mouth spray 1 spray  1 spray Mouth/Throat PRN Dara Lords, PA-C   1 spray at 02/09/17 2207  . phenylephrine (NEO-SYNEPHRINE) 10 mg in sodium chloride 0.9 % 250 mL (0.04 mg/mL) infusion  0-400 mcg/min Intravenous Titrated Maeola Harman, MD   Stopped at 02/12/17 0500  . sacubitril-valsartan (ENTRESTO) 24-26 mg per tablet  1 tablet Oral BID Clegg, Amy D, NP   1 tablet at 02/18/17 1150  . sodium chloride flush (NS) 0.9 % injection 3 mL  3 mL Intravenous Q12H Bensimhon, Bevelyn Buckles, MD      . sodium chloride flush (NS) 0.9 % injection 3 mL  3 mL Intravenous PRN Bensimhon, Bevelyn Buckles, MD      . spironolactone (ALDACTONE) tablet 12.5 mg  12.5 mg Oral Daily Clegg, Amy D, NP   12.5 mg at 02/18/17 1151     Discharge Medications: Please see discharge summary for a list of discharge medications.  Relevant Imaging Results:  Relevant Lab Results:   Additional Information SS#: [id number]  Althea Charon, LCSW

## 2017-02-18 NOTE — Telephone Encounter (Signed)
Sched appt 03/30/17 at 11:30. Lm on hm#.

## 2017-02-18 NOTE — Telephone Encounter (Signed)
-----   Message from Phillips Odorarol S Pullins, RN sent at 02/18/2017 11:54 AM EDT ----- Regarding: Needs 2-3 week f/u with Dr. Myra GianottiBrabham S/p Aortobifem bypass 02/09/17  ----- Message ----- From: Dara Lordshyne, Samantha J, PA-C Sent: 02/18/2017  11:06 AM To: [facility] Charge Pool  S/p aortobifem bypass.  F/u with Dr. Myra GianottiBrabham in 2-3 weeks.  Thanks

## 2017-02-18 NOTE — Discharge Instructions (Signed)
° °[facility] [place] ° °Discharge Instructions ° ° Open Aortic Surgery ° °Please refer to the following instructions for your post-procedure care. Your surgeon or Physician Assistant will discuss any changes with you. ° °Activity ° °Avoid lifting more than eight pounds (a gallon of milk) until after your first post-operative visit. You are encouraged to walk as much as you can. You can slowly return to normal activities but must avoid strenuous activity and heavy lifting until your doctor tells you it's OK. Heavy lifting can hurt the incision and cause a hernia. Avoid activities such as vacuuming or swinging a golf club. It is normal to feel tired for several weeks after your surgery. Do not drive until your doctor gives the OK and you are no longer taking prescription pain medications. It is also normal to have difficulty with sleep habits, eating and bowl movements after surgery. These will go away with time. ° °Bathing/Showering ° °You may shower after you go home. Do not soak in a bathtub, hot tub, or swim until the incision heals. ° °Incision Care ° °Shower every day. Clean your incision with mild soap and water. Pat the area dry with a clean towel. You do not need a bandage unless otherwise instructed. Do not apply any ointments or creams to your incision. You may have skin glue on your incision. Do not peel it off. It will come off on its own in about one week. If you have staples or sutures along your incision, they will be removed at your post op appointment. ° °If you have groin incisions, wash the groin wounds with soap and water daily and pat dry. (No tub bath-only shower)  Then put a dry gauze or washcloth in the groin to keep this area dry to help prevent wound infection.  Do this daily and as needed.  Do not use Vaseline or neosporin on your incisions.  Only use soap and water on your incisions and then protect and keep dry. ° °Diet ° °Resume your normal diet. There are no  special food restriction following this procedure. A low fat/low cholesterol diet is recommended for all patients with vascular disease. After your aortic surgery, it's normal to feel full faster than usual and to not feel as hungry as you normally would. You will probably lose weight initially following your surgery. It's best to eat small, frequent meals over the course of the day. Call the office if you find that you are unable to eat even small meals. In order to heal from your surgery, it is CRITICAL to get adequate nutrition. Your body requires vitamins, minerals, and protein. Vegetables are the best source of vitamins and minerals. °causing pain, you may take over-the-counter pain reliever such as acetaminophen (Tylenol). If you were prescribed a stronger pain medication, please be aware these medication can cause nausea and constipation. Prevent nausea by taking the medication with a snack or meal. Avoid constipation by drinking plenty of fluids and eating foods with a high amount of fiber, such as fruits, vegetables and grains. Take 100mg of the over-the-counter stool softener Colace twice a day as needed to help with constipation. A laxative, such as Milk of Magnesia, may be recommended for you at this time. Do not take a laxative unless your surgeon or P.A. tells you it's OK. Do not take Tylenol if you are taking stronger pain medications (such as Percocet). ° °Follow Up ° °Our office will schedule a follow up appointment 2-3 weeks after   discharge. ° °Please call us immediately for any of the following conditions  ° ° .     Severe or worsening pain in your legs or feet or in your abdomen back or chest. °Increased pain, redness drainage (pus) from your incision site. °Increased abdominal pain, bloating, nausea, vomiting, or persistent diarrhea. °Fever of 101 degrees or higher. °Swelling in your leg (s). ° °Reduce your risk of vascular disease ° °Stop smoking. If you would like help, call [facility] at  1-800-QUIT-NOW (1-800-784-8669) or [place] at 336-586-4000. °Manage your cholesterol °Maintain a desired weight °Control your diabetes °Keep your blood pressure down ° °If you have any questions please call the office at [phone]. ° ° ° °

## 2017-02-18 NOTE — H&P (View-Only) (Signed)
Advanced Heart Failure Rounding Note  PCP: Dr Leary RocaMahoney  Primary Cardiologist: Dr Clifton JamesMcAlhany   Subjective:    Yesterday milrinone stopped and spiro was added.   Overall feeling much better. Denies SOB. Maintaining NSR. Wounds healing.     Objective:   Weight Range: 145 lb 8.1 oz (66 kg) Body mass index is 28.9 kg/m.   Vital Signs:   Temp:  [97.9 F (36.6 C)-98.3 F (36.8 C)] 97.9 F (36.6 C) (09/06 0755) Pulse Rate:  [51-88] 81 (09/06 0700) Resp:  [10-23] 22 (09/06 0700) BP: (127-169)/(40-76) 169/73 (09/06 0700) SpO2:  [92 %-99 %] 92 % (09/06 0700) Weight:  [145 lb 8.1 oz (66 kg)] 145 lb 8.1 oz (66 kg) (09/06 0500) Last BM Date: 02/16/17  Weight change: Filed Weights   02/15/17 0850 02/16/17 0400 02/17/17 0500  Weight: 146 lb 1.6 oz (66.3 kg) 151 lb 8 oz (68.7 kg) 145 lb 8.1 oz (66 kg)    Intake/Output:   Intake/Output Summary (Last 24 hours) at 02/17/17 0845 Last data filed at 02/17/17 0500  Gross per 24 hour  Intake            242.4 ml  Output              200 ml  Net             42.4 ml      Physical Exam   CVP 4.  General:  Well appearing. No resp difficulty. Sitting in the chair HEENT: normal Neck: supple. no JVD. Carotids 2+ bilat; no bruits. No lymphadenopathy or thryomegaly appreciated. Cor: PMI nondisplaced. Regular rate & rhythm. No rubs, gallops or murmurs. Lungs: clear Abdomen: soft, nontender, nondistended. No hepatosplenomegaly. No bruits or masses. Good bowel sounds. Extremities: no cyanosis, clubbing, rash, edema.  R and L groin incisions approximated.  Neuro: alert & orientedx3, cranial nerves grossly intact. moves all 4 extremities w/o difficulty. Affect pleasant    Telemetry   NSR 50-60s personally reviewed.   EKG    A fib 93 bpm on 02/09/2017   Labs    CBC  Recent Labs  02/15/17 0410  WBC 12.3*  HGB 10.2*  HCT 31.0*  MCV 87.3  PLT 216   Basic Metabolic Panel  Recent Labs  02/16/17 0433 02/17/17 0336  NA 134*  135  K 3.7 3.6  CL 99* 99*  CO2 29 29  GLUCOSE 114* 96  BUN 26* 15  CREATININE 1.25* 0.94  CALCIUM 7.8* 7.8*   Liver Function Tests No results for input(s): AST, ALT, ALKPHOS, BILITOT, PROT, ALBUMIN in the last 72 hours. No results for input(s): LIPASE, AMYLASE in the last 72 hours. Cardiac Enzymes  Recent Labs  02/14/17 0949 02/14/17 1543 02/14/17 2134  TROPONINI 2.19* 2.03* 1.90*    BNP: BNP (last 3 results)  Recent Labs  02/14/17 0948  BNP 1,788.7*    ProBNP (last 3 results) No results for input(s): PROBNP in the last 8760 hours.   D-Dimer No results for input(s): DDIMER in the last 72 hours. Hemoglobin A1C No results for input(s): HGBA1C in the last 72 hours. Fasting Lipid Panel No results for input(s): CHOL, HDL, LDLCALC, TRIG, CHOLHDL, LDLDIRECT in the last 72 hours. Thyroid Function Tests No results for input(s): TSH, T4TOTAL, T3FREE, THYROIDAB in the last 72 hours.  Invalid input(s): FREET3  Other results:   Imaging    No results found.   Medications:     Scheduled Medications: . amiodarone  200 mg Oral  Daily  . aspirin EC  81 mg Oral Daily  . atorvastatin  10 mg Oral q1800  . Chlorhexidine Gluconate Cloth  6 each Topical Daily  . enoxaparin (LOVENOX) injection  65 mg Subcutaneous Q12H  . hydrALAZINE  25 mg Oral TID  . insulin aspart  0-15 Units Subcutaneous TID WC  . isosorbide mononitrate  30 mg Oral Daily  . pantoprazole  40 mg Oral Daily  . sodium chloride flush  10-40 mL Intracatheter Q12H  . spironolactone  12.5 mg Oral Daily    Infusions: . dextrose 5 % and 0.45% NaCl Stopped (02/16/17 1340)  . phenylephrine (NEO-SYNEPHRINE) Adult infusion Stopped (02/12/17 0500)    PRN Medications: acetaminophen **OR** acetaminophen, bisacodyl, guaiFENesin-dextromethorphan, hydrALAZINE, morphine injection, ondansetron, oxyCODONE-acetaminophen, phenol, potassium chloride, sodium chloride flush    Patient Profile   [place] is a 4  year with  a history of ICM, CAD, S/P RCA BMS 2001, S/P LAD BMS 2009 RCA was occluded with collaterals, non-hodgkins lymphoma (completed chemo in 2001), HTN, hyperlipidemia, PAD, smoker, smoker, DMII, and cervical cancer.   Admitted with Aorto Bifem. Post op course complicated by acute respiratory failure and a/c systolic heart failure.    Assessment/Plan  1. PAD- S/P Aorto Bi Fem 8/29 with bilateral vac R and L groin 2. Acute Respiratory Failure - Sats down to the 70s on 9/3. Improved with IV lasix.  Resolved on room air.   3. A/C Systolic Heart Failure- ICM. ECHO this admit with EF 30-35%.  ([place] reviewed EF 25%) Initial CO-OX Yesterday CO-OX 47% .  Stable off milrinone. Todays CO-OX is 66%.  Hold off on bb.  Stop hydralazine/imdur Start entresto 24-26  Stop cvp and CO-OX  4. A fib RVR- started on IV amio. During amio load had pauses. Continue amio 200 mg daily today. If remains in NSR will stop amio tomorrow 5. AKI- creatinine on admit 1.0. Creatinine peaked at 1.78. Creatinine trending down to 0.9. Restart arni.  6.CAD- BMS RCA 2001 and BMS LAD 2009. Had elevated troponin 2.2>2.0>1.9. No CP.  On statin. LHC tomorrow.   7. Smoker - discussed smoking cessation.  8. H/O non hodgkins lymphoma  2001 treated with chemo.  9. DMII- on sliding scale. Off metformin with elevated creatinine. Can restart after cath.   LHC/RHC tomorrow .  Ok to move out of ICU today.     Length of Stay: 8   Amy Clegg, NP  02/17/2017, 8:45 AM  Advanced Heart Failure Team Pager [phone] (M-F; 7a - 4p)  Please contact [facility] for night-coverage after hours (4p -7a ) and weekends on [email or web address]  Patient seen and examined with Tonye Becket, NP. We discussed all aspects of the encounter. I agree with the assessment and plan as stated above.   Improving post-op. HF stable. Maintaining NSR. Plan R/L heart cath tomorrow. Can stop amio and lovenox as post-op AF has resolved. Can go to tele. Start  Entresto. Continue to hold metformin for cath.   Arvilla Meres, MD  9:35 AM

## 2017-02-18 NOTE — Discharge Summary (Signed)
AAA Discharge Summary    Charlotte Salas 04-10-1946 71 y.o. female  [id number]  Admission Date: 02/09/2017  Discharge Date: 02/19/17  Physician: Nada Libman, MD  Admission Diagnosis: Peripheral Vascular Disease with Bilateral Claudication  I70.213 AAA  I71.4 hf - hp   HPI:   This is a 71 y.o. female who is here today for follow-up of her leg pain.  She reports burning and numbness with swelling in both legs.  Earlier this year she underwent venous ablation of the bilateral great and small saphenous veins and [place].  This did not alleviate her symptoms.  She was having difficulty walking approximately 100 feet a cousin of hip pain.  She underwent diagnostic angiography recently which showed left distal external iliac and common femoral occlusion with severe stenosis within the right common femoral artery.  She is also found to have an abdominal aortic aneurysm.  She is sent for a CT scan to further evaluate this.  She is here today for further discussions.   The patient is a diabetic. Her A1c ranges 5.8-6.8. She has a history of coronary artery disease and is status post non-STEMI. She has been stented in the past. She is medically managed for hypertension with ACE inhibitor. She takes a statin for hypercholesterolemia. She is a current smoker.  Hospital Course:  The patient was admitted to the hospital and taken to the operating room on 02/09/2017 - 02/18/2017 and underwent: Procedure:   #1: Aorto bifemoral bypass graft using 14 x 7 dacryon graft                         #2:R & G AAA                         #3:  Bilateral superficial femoral and profunda femoral endarterectomy                                #4: Left profunda femoral patch angioplasty using bovine pericardial patch                         #5: Bilateral placement of incisional wound VAC  Intraoperative findings:    Extensive calcified plaque from the renals distally.  I performed a end to end  anastomosis to the infrarenal abdominal aorta after performing endarterectomy at this level.  The aorta was ligated at the bifurcation to exclude the aneurysm.  Bilateral common femoral arteries were occluded.  Extensive disease was noted within the superficial femoral artery that went beyond the incision.  I performed an eversion endarterectomy of approximately one centimeters worth of plaque within the profunda femoral artery on the right.  On the left, the calcified plaque went on the profunda approximately 5 cm.  I performed endarterectomy of this area and then used a bovine pericardial patch for patch angioplasty prior to placing the limb of the aortobifemoral graft on the distal common femoral artery  The pt tolerated the procedure well and was transported to the PACU in good condition.   By POD 1, the pt was doing well.  She had brisk doppler signals in both feet.  She did have mild hyperkalemia and increase in creatinine this am at 5.2 and 1.38 respectively.  IVF does not have supplemental K+.    She had acute surgical blood loss anemia that she  was tolerating.  She received 550cc of cell saver in the OR.   She is encouraged to mobilize.    By POD 2, her BP had trended downward overnight and HR stable but trending upward and her oxygen requirements increased.  Her IVF were decreased and she was given a gentle dose of lasix, pulmonary toilet with percussion and OOB to chair.  CXR was ordered.   She did have a leukocytosis and was afebrile.  Her NGT was kept in place.  She denied nausea.    That afternoon, she was much more alert as narcotics were being held.  Toradol was given for better pain control.  Her abdomen was soft but tender and breathing better with lasix.  For some reason the patient is having trouble bearing weight on her feet.  She has intact motor and sensory function.  We will continue to encourage ambulation.  Will continue to monitor WBC.  No indication for abx at this time unless she  develops a fever.   On POD 3, on cxr, she had increased edema and was encouraged to mobilize and IS.  She was regular rate but having PVC's.  Cardiology was consulted.  She was diuresed.   Per cardiology: 1 postoperative atrial fibrillation/flutter-patient is having intermittent atrial arrhythmias. Her heart rate is elevated but with a combination of amiodarone and metoprolol she developed heart rates in the 30s when in sinus as well as pauses maximum length 3.6 seconds. I will discontinue her metoprolol. Instead we will try IV amiodarone alone to help maintain sinus rhythm. Follow on telemetry closely. Repeat echocardiogram. CHADSvasc 6. However this appears to be postoperative atrial arrhythmias and therefore I do not think she will require long-term anticoagulation unless arrhythmias persist. We would also need to discuss with vascular surgery when appropriate timing would be postoperatively. We'll plan to repeat echocardiogram.   2 peripheral vascular disease status post aortobifemoral bypass-management per vascular surgery.  3 coronary artery disease-resume aspirin and statin at discharge.  4 ischemic cardiomyopathy-resume carvedilol and ACE inhibitor when patient taking oral medications and BP allows.  On POD 4, her creatinine started to increase and her toradol was held. She was on amiodarone and started therapeutic lovenox.    Per cardiology: 1 postoperative atrial fibrillation/flutter-Patient remains in atrial fibrillation this morning. She has not had bradycardia since yesterday at approximately 11 AM. I will continue IV amiodarone and hopefully she will convert to sinus. Follow telemetry closely. This is likely postoperative atrial arrhythmia and I would plan to continue amiodarone for 8 weeks following discharge. If she holds sinus rhythm could discontinue at that point. Given persistent nature of atrial fibrillation I would like to begin anticoagulation. CHADSvasc 6.Would treat with  apixaban 5 mg BID when ok with vascular surgery. Given this is postoperative atrial arrhythmia I do not think she will require long-term anticoagulation. Would continue for approximately 8 weeks and discontinue if no further atrial fibrillation.   2 peripheral vascular disease status post aortobifemoral bypass-management per vascular surgery.  3 coronary artery disease-resume aspirin and statin at discharge.  4 ischemic cardiomyopathy-LV function slightly worse compared to previous; resume carvedilol and ACE inhibitor when patient taking oral medications and BP allows.FU with Dr Marcina MillardBrach following DC. Titrate medications as an outpatient. Repeat echocardiogram 3 months later. If ejection fraction remains less than 35% would need to consider ICD. Patient mildly volume overloaded with edema noted on chest x-ray. Will give Lasix 20 mg IV 1.   On POD 5, per cards:  Mixed venous O2 is 47 in setting of elevated CVP, poor urine output, volume overload, and worsening renal function. Last LVEF 30-35%.  We will start low dose milrinone, repeat Co-ox later today. Due to her poor renal function start milrinone 0.0625 mcg/kg/min, may titrate further pending her f/u co-ox.  Dina Rich MD   1220pm addendum Troponin elevated in setting of recent afib with RVR, congestive heart failure with volume overload, AKI, and recent surgery. Patient denies any chest pain. I suspect this is due to increase demand in setting of chronic obstructive CAD as opposed to acute obstructive disease. Her stress 2013 showed some inferior ischemia consistent with her known RCA disease that had collaterals by last cath. Echo 02/12/17 fairly stable LVEF, perhaps mild decrease however 5% difference certainely within realm of interobserver differences, no major decrease. She is on full dose anticoag for her afib, we will restart her home ASA. No beta blocker due to bradycardia, no ACE due to AKI, restart her home statin. Would not plan  for cath at this time.   Mixed venous O2 sat up to 61% on low dose milrinone at 0.0625 mcg/kg/min, will conitnue current dose, repeat co-ox in AM.   On POD 6, per cards:  By echo she has evidence of worsening LV dysfunction. This may be related to post-op stress but I am also concerned about progressive CAD. Will continue to stabilize with milrinone support with slow wean. Will also adjust oral cardiac meds. Hold b-block for now. Have recommended cardiac cath (from radial approach) prior to discharge.   Had post-op AF now back in NSR on amio. Will continue for now. If this is only episodee of AF will likely not commit her to long-term AC.    Her creatinine started to trend downward.  She remained on Milrinone for CHF and amio and anticoagulation were continued.  She continues to be in NSR.    On POD 7, Okay to start Eliquis per vascular.  Her thrombocytopenia improved.  Her pravena vacs were removed and groin incisions look good.    Per cards:  1. PAD- S/P Aorto Bi Fem 8/29 with bilateral vac R and L groin 2. Acute Respiratory Failure - Sats down to the 70s on 9/3. Improved with IV lasix. Oxygen weaned to room air today. Stable.  3. A/C Systolic Heart Failure- ICM. ECHO this admit with EF 30-35%. (Bensimhon reviewed EF 25%) Yesterday CO-OX 47% . Started on milrinone 0.0625 mcg. Todays CO-OX is 69%. Stop milrinone.  Considered digoxin but heart rate has been on the low side.  CVP 4. Stop IV lasix. Add 12.5 mg spiro daily.  BMET in am.  Hold off on bb. Increase hydralazine to 25 mg tid and increase imdur to 30 mg daily.  4. A fib RVR- started on IV amio. During amio load had pauses. Cut back amio to 200 mg daily.  Converted to NSR. On lovenox. Hold off on apixaban for now.   5. AKI- creatinine on admit 1.0. Creatinine peaked at 1.78. Creatinine trending down to 1.25. Hold off on arb. Add low dose spiro. Watch renal function.  6.CAD- BMS RCA 2001 and BMS LAD 2009. Had elevated troponin  2.2>2.0>1.9. N CP. Marland Kitchen On statin. Will need LHC prior to discharge.  Set up for tomorrow.  7. Smoker - discussed smoking cessation.  8. H/O non hodgkins lymphoma 2001 treated with chemo.  9. DMII- on sliding scale. Off metformin with elevated creatinine.   Set up LHC in the next day  or so.   Remains weak but breathing better. Co-ox looks good on low-dose milrinone support. Agree with stopping. CVP down to 4 -> can switch to po lasix. No b-blocker yet with low output. Continue hydral/nitrates. Will switch to ARB/ARNI post-cath. Likely R/L cath on Friday.  Can likely stop amio soon.   By POD 8, her milrinone was off.  Her eliquis was not started for heart cath.  Pt now on RA.  The pt was transferred to 4 east.  Per cards:  Improving post-op. HF stable. Maintaining NSR. Plan R/L heart cath tomorrow. Can stop amio and lovenox as post-op AF has resolved. Can go to tele. Start Entresto. Continue to hold metformin for cath.   POD 9, per cards: 1. PAD- S/P Aorto Bi Fem 8/29  - wounds healing but some mild serous drainage. VVS following 2. Acute Respiratory Failure - Sats down to the 70s on 9/3. Improved with IV lasix.  -Resolved on room air.   3. A/C Systolic Heart Failure- ICM. ECHO this admit with EF 30-35%. (Bensimhon reviewed EF 25%) - stable off milrinone. Volume status looks good. - for cath. Titrate entresto as tolerated  4. A fib RVR- started on IV amio.  - Npw in NSR. Was just post-op. AC and amio stopped  5. AKI -resolved 6.CAD- BMS RCA 2001 and BMS LAD 2009. Had elevated troponin 2.2>2.0>1.9. No CP.  On statin.  - cath today 7. Smoker - discussed smoking cessation.  8. H/O non hodgkins lymphoma 2001 treated with chemo.  9. DMII- on sliding scale. Off metformin with elevated creatinine. Can restart after cath.   On 02/18/17, pt was taken to the cardiac cath lab: 1. Well compensated hemodynamics with mild PAH 2. Inability to perform coronary angiogram due to arterial access issues  (see procedure note) --Continue medical therapy.   Pt has minimal drainage from right groin proximally.  Leukocytosis is trending downward and she has been afebrile.  Her incisions appear to be healing nicely.  Had another discussion with pt about groin wound care.  It was discussed with HF and from their perspective, okay to discharge pt to rehab.    We will place bilateral groin provena wound vacs.  Maintain wound vacs for 7 days then remove and discard.  Keep groins clean and dry. Discharge on Keflex po for 2 weeks.  The remainder of the hospital course consisted of increasing mobilization and increasing intake of solids without difficulty.  CBC    Component Value Date/Time   WBC 11.3 (H) 02/18/2017 1101   RBC 3.72 (L) 02/18/2017 1101   HGB 10.7 (L) 02/18/2017 1101   HCT 33.4 (L) 02/18/2017 1101   PLT 368 02/18/2017 1101   MCV 89.8 02/18/2017 1101   MCH 28.8 02/18/2017 1101   MCHC 32.0 02/18/2017 1101   RDW 14.7 02/18/2017 1101   LYMPHSABS 2.0 02/13/2017 2010   MONOABS 1.3 (H) 02/13/2017 2010   EOSABS 0.0 02/13/2017 2010   BASOSABS 0.0 02/13/2017 2010    BMET    Component Value Date/Time   NA 134 (L) 02/18/2017 0319   K 4.4 02/18/2017 0319   CL 101 02/18/2017 0319   CO2 26 02/18/2017 0319   GLUCOSE 111 (H) 02/18/2017 0319   BUN 14 02/18/2017 0319   CREATININE 1.02 (H) 02/18/2017 1101   CALCIUM 8.0 (L) 02/18/2017 0319   GFRNONAA 53 (L) 02/18/2017 1101   GFRAA >60 02/18/2017 1101       Discharge Diagnosis:  Peripheral Vascular Disease with Bilateral  Claudication  I70.213 AAA  I71.4 hf - hp  Secondary Diagnosis: Patient Active Problem List   Diagnosis Date Noted  . Acute systolic heart failure (HCC)   . PAF (paroxysmal atrial fibrillation) (HCC)   . AAA (abdominal aortic aneurysm) (HCC) 02/09/2017  . Tobacco abuse 01/28/2012  . CARDIOMYOPATHY, ISCHEMIC 12/22/2009  . CAD in native artery 01/07/2009  . HYPERLIPIDEMIA 01/01/2009  . HYPERTENSION 01/01/2009  .  CERVICAL CANCER, HX OF 01/01/2009  . NON-HODGKIN'S LYMPHOMA, HX OF 01/01/2009  . ATRIAL SEPTAL DEFECT, HX OF 01/01/2009   Past Medical History:  Diagnosis Date  . Atrial septal defect    hx of it  . CAD (coronary artery disease)    s/p prior stenting of the right coronary artery in '01 and non-ST-elevation myocardial infarction in jan '09, with a new bare-metal stent placed in the ostium of the left anterior descending coronary artert and the coronary anatomy as described above with total occlusion of the previously placed right coronary artery stent. EF 35-40%  . CHF (congestive heart failure) (HCC)   . Diabetes mellitus without complication (HCC)   . Hepatitis 1971   " A"  . History of cervical cancer   . History of non-Hodgkin's lymphoma   . HTN (hypertension)   . HTN (hypertension)   . Hyperlipidemia   . Ischemic cardiomyopathy    EF 35-40% by 01/2016 echo  . Myocardial infarction (HCC) 2009, 2000  . Other and unspecified hyperlipidemia   . Peripheral vascular disease (HCC)   . Personal history of malignant neoplasm of cervix uteri   . Personal history of other lymphatic and hematopoietic neoplasm 2002   hodgkins lymphoma     Allergies as of 02/18/2017      Reactions   Codeine Nausea Only      Medication List    STOP taking these medications   lisinopril 10 MG tablet Commonly known as:  PRINIVIL,ZESTRIL     TAKE these medications   aspirin EC 81 MG tablet Take 81 mg by mouth daily.   B-COMPLEX/B-12 PO Take 1 tablet by mouth daily.   carvedilol 3.125 MG tablet Commonly known as:  COREG Take 1 tablet (3.125 mg total) by mouth 2 (two) times daily with a meal. What changed:  medication strength  how much to take  how to take this  when to take this  additional instructions   Cranberry 1000 MG Caps Take 1 capsule by mouth daily.   metFORMIN 500 MG tablet Commonly known as:  GLUCOPHAGE Take 500 mg by mouth daily with breakfast.   nitroGLYCERIN 0.4 MG  SL tablet Commonly known as:  NITROSTAT Place 1 tablet (0.4 mg total) under the tongue every 5 (five) minutes as needed for chest pain (for chest pain).   omega-3 acid ethyl esters 1 g capsule Commonly known as:  LOVAZA Take by mouth 2 (two) times daily.   pregabalin 75 MG capsule Commonly known as:  LYRICA Take 75 mg by mouth 2 (two) times daily.   sacubitril-valsartan 24-26 MG Commonly known as:  ENTRESTO Take 1 tablet by mouth 2 (two) times daily.   simvastatin 20 MG tablet Commonly known as:  ZOCOR Take 1 tablet (20 mg total) by mouth daily.   spironolactone 25 MG tablet Commonly known as:  ALDACTONE Take 0.5 tablets (12.5 mg total) by mouth daily.   traMADol 50 MG tablet Commonly known as:  ULTRAM Take 1 tablet (50 mg total) by mouth every 6 (six) hours as needed for moderate  pain or severe pain.            Discharge Care Instructions        Start     Ordered   02/18/17 0000  carvedilol (COREG) 3.125 MG tablet  2 times daily with meals    Question:  Supervising Provider  Answer:  Dolores Patty   02/18/17 1348   02/18/17 0000  sacubitril-valsartan (ENTRESTO) 24-26 MG  2 times daily    Question:  Supervising Provider  Answer:  Arvilla Meres R   02/18/17 1348   02/18/17 0000  spironolactone (ALDACTONE) 25 MG tablet  Daily    Question:  Supervising Provider  Answer:  Dolores Patty   02/18/17 1348   02/18/17 0000  traMADol (ULTRAM) 50 MG tablet  Every 6 hours PRN    Question:  Supervising Provider  Answer:  Nada Libman   02/18/17 1348       Instructions:   [facility]  Discharge Instructions   Open Aortic Surgery  Please refer to the following instructions for your post-procedure care. Your surgeon or Physician Assistant will discuss any changes with you.  Activity  Avoid lifting more than eight pounds (a gallon of milk) until after your first post-operative visit. You are encouraged to walk as  much as you can. You can slowly return to normal activities but must avoid strenuous activity and heavy lifting until your doctor tells you it's OK. Heavy lifting can hurt the incision and cause a hernia. Avoid activities such as vacuuming or swinging a golf club. It is normal to feel tired for several weeks after your surgery. Do not drive until your doctor gives the OK and you are no longer taking prescription pain medications. It is also normal to have difficulty with sleep habits, eating and bowl movements after surgery. These will go away with time.  Bathing/Showering  You may shower after you go home. Do not soak in a bathtub, hot tub, or swim until the incision heals.  Incision Care Provena incisional wound vacs bilateral groins do not remove for 7 days.  Placed 02/19/2017.  Shower every day. Clean your incision with mild soap and water. Pat the area dry with a clean towel. You do not need a bandage unless otherwise instructed. Do not apply any ointments or creams to your incision. You may have skin glue on your incision. Do not peel it off. It will come off on its own in about one week. If you have staples or sutures along your incision, they will be removed at your post op appointment.  After wound vacs are removed: If you have groin incisions, wash the groin wounds with soap and water daily and pat dry. (No tub bath-only shower)  Then put a dry gauze or washcloth in the groin to keep this area dry to help prevent wound infection.  Do this daily and as needed.  Do not use Vaseline or neosporin on your incisions.  Only use soap and water on your incisions and then protect and keep dry.  Diet  Resume your normal diet. There are no special food restriction following this procedure. A low fat/low cholesterol diet is recommended for all patients with vascular disease. After your aortic surgery, it's normal to feel full faster than usual and to not feel as hungry as you normally would. You will  probably lose weight initially following your surgery. It's best to eat small, frequent meals over the course of the  day. Call the office if you find that you are unable to eat even small meals. In order to heal from your surgery, it is CRITICAL to get adequate nutrition. Your body requires vitamins, minerals, and protein. Vegetables are the best source of vitamins and minerals. causing pain, you may take over-the-counter pain reliever such as acetaminophen (Tylenol). If you were prescribed a stronger pain medication, please be aware these medication can cause nausea and constipation. Prevent nausea by taking the medication with a snack or meal. Avoid constipation by drinking plenty of fluids and eating foods with a high amount of fiber, such as fruits, vegetables and grains. Take 100mg  of the over-the-counter stool softener Colace twice a day as needed to help with constipation. A laxative, such as Milk of Magnesia, may be recommended for you at this time. Do not take a laxative unless your surgeon or P.A. tells you it's OK. Do not take Tylenol if you are taking stronger pain medications (such as Percocet).  Follow Up  Our office will schedule a follow up appointment 2-3 weeks after discharge.  Please call us immediately for any of the following conditions    .     Severe or worsening pain in your legs or feet or in your abdomen back or chest. Increased pain, redness drainage (pus) from your incision site. Increased abdominal pain, bloating, nausea, vomiting, or persistent diarrhea. Fever of 101 degrees or higher. Swelling in your leg (s).  Reduce your risk of vascular disease  Stop smoking. If you would like help, call [facility] at 1-800-QUIT-NOW ((587)712-9283) or [place] at [phone]. Manage your cholesterol Maintain a desired weight Control your diabetes Keep your blood pressure down  If you have any questions please call the office at [phone].    Prescriptions  given: 1. Tramadol #30 No Refill 2.  Entresto (per cardiology) 3.  Spironolactone (per cardiology)  Disposition: Rehab  Patient's condition: is Good  Follow up: 1. Dr. Myra Gianotti in 2-3 weeks 2. Lorin Picket, NP 03/01/17 @ 3pm   Doreatha Massed, PA-C [facility] [phone] 02/18/2017  1:48 PM   - For VQI Registry use -  Post-op:  Time to Extubation: [x]  In OR, [ ]  < 12 hrs, [ ]  12-24 hrs, [ ]  >=24 hrs Vasopressors Req. Post-op: Milrinone ICU Stay: 7 day in ICU Transfusion: Yes   If yes, 2 units given MI: No, [ ]  Troponin only, [ ]  EKG or Clinical New Arrhythmia: Yes  Complications: CHF: Yes Resp failure: Yes, [ ]  Pneumonia, [ ]  Ventilator Chg in renal function: Yes, but improved [x ] Inc. Cr > 0.5, [ ]  Temp. Dialysis, [ ]  Permanent dialysis Leg ischemia: No, no Surgery needed, [ ]  Yes, Surgery needed, [ ]  Amputation Bowel ischemia: No, [ ]  Medical Rx, [ ]  Surgical Rx Wound complication: No, [ ]  Superficial separation/infection, [ ]  Return to OR Return to OR: No  Return to OR for bleeding: No Stroke: No, [ ]  Minor, [ ]  Major  Discharge medications: Statin use:  Yes ASA use:  Yes   Plavix use:  No  Beta blocker use:  Yes  ACEI use:  No ARB use:  Yes CCB use:  No Coumadin use:  No

## 2017-02-18 NOTE — Progress Notes (Signed)
Advanced Heart Failure Rounding Note  PCP: Dr Leary Roca  Primary Cardiologist: Dr Clifton James   Subjective:    Feeling good. Denies CP or SOB. BP stable. Tolerating Entresto.     Objective:   Weight Range: 66.5 kg (146 lb 9.6 oz) Body mass index is 29.11 kg/m.   Vital Signs:   Temp:  [97.9 F (36.6 C)-98.4 F (36.9 C)] 98.4 F (36.9 C) (09/07 0336) Pulse Rate:  [59-95] 82 (09/07 0336) Resp:  [12-23] 16 (09/06 1700) BP: (107-174)/(50-85) 143/70 (09/07 0336) SpO2:  [96 %-99 %] 98 % (09/07 0336) Weight:  [66.5 kg (146 lb 9.6 oz)] 66.5 kg (146 lb 9.6 oz) (09/07 0336) Last BM Date: 02/17/17  Weight change: Filed Weights   02/16/17 0400 02/17/17 0500 02/18/17 0336  Weight: 68.7 kg (151 lb 8 oz) 66 kg (145 lb 8.1 oz) 66.5 kg (146 lb 9.6 oz)    Intake/Output:   Intake/Output Summary (Last 24 hours) at 02/18/17 0742 Last data filed at 02/18/17 0500  Gross per 24 hour  Intake              620 ml  Output                0 ml  Net              620 ml      Physical Exam   General:  Well appearing. No resp difficulty HEENT: normal Neck: supple. no JVD. Carotids 2+ bilat; no bruits. No lymphadenopathy or thryomegaly appreciated. Cor: PMI nondisplaced. Regular rate & rhythm. No rubs, gallops or murmurs. Lungs: clear but decreased BS throughout Abdomen: soft, nontender, nondistended. No hepatosplenomegaly. No bruits or masses. Good bowel sounds.+ healing wounds with some erythema Extremities: no cyanosis, clubbing, rash, edema Neuro: alert & orientedx3, cranial nerves grossly intact. moves all 4 extremities w/o difficulty. Affect pleasant  Telemetry   NSR 60-70s personally reviewed.   EKG    A fib 93 bpm on 02/09/2017   Labs    CBC No results for input(s): WBC, NEUTROABS, HGB, HCT, MCV, PLT in the last 72 hours. Basic Metabolic Panel  Recent Labs  02/17/17 0336 02/18/17 0319  NA 135 134*  K 3.6 4.4  CL 99* 101  CO2 29 26  GLUCOSE 96 111*  BUN 15 14    CREATININE 0.94 0.91  CALCIUM 7.8* 8.0*   Liver Function Tests No results for input(s): AST, ALT, ALKPHOS, BILITOT, PROT, ALBUMIN in the last 72 hours. No results for input(s): LIPASE, AMYLASE in the last 72 hours. Cardiac Enzymes No results for input(s): CKTOTAL, CKMB, CKMBINDEX, TROPONINI in the last 72 hours.  BNP: BNP (last 3 results)  Recent Labs  02/14/17 0948  BNP 1,788.7*    ProBNP (last 3 results) No results for input(s): PROBNP in the last 8760 hours.   D-Dimer No results for input(s): DDIMER in the last 72 hours. Hemoglobin A1C No results for input(s): HGBA1C in the last 72 hours. Fasting Lipid Panel No results for input(s): CHOL, HDL, LDLCALC, TRIG, CHOLHDL, LDLDIRECT in the last 72 hours. Thyroid Function Tests No results for input(s): TSH, T4TOTAL, T3FREE, THYROIDAB in the last 72 hours.  Invalid input(s): FREET3  Other results:   Imaging    No results found.   Medications:     Scheduled Medications: . [MAR Hold] aspirin EC  81 mg Oral Daily  . [MAR Hold] atorvastatin  10 mg Oral q1800  . [MAR Hold] insulin aspart  0-15 Units  Subcutaneous TID WC  . [MAR Hold] pantoprazole  40 mg Oral Daily  . [MAR Hold] sacubitril-valsartan  1 tablet Oral BID  . sodium chloride flush  3 mL Intravenous Q12H  . [MAR Hold] spironolactone  12.5 mg Oral Daily    Infusions: . sodium chloride 250 mL (02/18/17 0603)  . sodium chloride 250 mL (02/18/17 0605)  . dextrose 5 % and 0.45% NaCl Stopped (02/16/17 1340)  . [MAR Hold] phenylephrine (NEO-SYNEPHRINE) Adult infusion Stopped (02/12/17 0500)    PRN Medications: sodium chloride, [MAR Hold] acetaminophen **OR** [MAR Hold] acetaminophen, [MAR Hold] bisacodyl, [MAR Hold] guaiFENesin-dextromethorphan, [MAR Hold] hydrALAZINE, [MAR Hold]  morphine injection, [MAR Hold] ondansetron, [MAR Hold] oxyCODONE-acetaminophen, [MAR Hold] phenol, sodium chloride flush    Patient Profile   [name] is a [age] year with  a  history of ICM, CAD, S/P RCA BMS 2001, S/P LAD BMS 2009 RCA was occluded with collaterals, non-hodgkins lymphoma (completed chemo in 2001), HTN, hyperlipidemia, PAD, smoker, smoker, DMII, and cervical cancer.   Admitted with Aorto Bifem. Post op course complicated by acute respiratory failure and a/c systolic heart failure.    Assessment/Plan   1. PAD- S/P Aorto Bi Fem 8/29  - wounds healing but some mild serous drainage. VVS following 2. Acute Respiratory Failure - Sats down to the 70s on 9/3. Improved with IV lasix.  -Resolved on room air.   3. A/C Systolic Heart Failure- ICM. ECHO this admit with EF 30-35%.  (Charlotte Salas reviewed EF 25%) - stable off milrinone. Volume status looks good. - for cath. Titrate entresto as tolerated  4. A fib RVR- started on IV amio.  - Npw in NSR. Was just post-op. AC and amio stopped  5. AKI -resolved 6.CAD- BMS RCA 2001 and BMS LAD 2009. Had elevated troponin 2.2>2.0>1.9. No CP.  On statin.  - cath today 7. Smoker - discussed smoking cessation.  8. H/O non hodgkins lymphoma  2001 treated with chemo.  9. DMII- on sliding scale. Off metformin with elevated creatinine. Can restart after cath.   Length of Stay: 9   [name], Charlotte Pourciau, MD  02/18/2017, 7:42 AM  Advanced Heart Failure Team Pager [phone] (M-F; 7a - 4p)  Please contact [facility] for night-coverage after hours (4p -7a ) and weekends on [email or web address]

## 2017-02-18 NOTE — Progress Notes (Signed)
OT Cancellation Note  Patient Details Name: Charlotte Salas MRN: [id number] DOB: 1946/03/29   Cancelled Treatment:    Reason Eval/Treat Not Completed: Patient at procedure or test/ unavailable. Pt in cath lab.   Evette GeorgesLeonard, Maxie Slovacek Eva [phone] 02/18/2017, 7:50 AM

## 2017-02-18 NOTE — Care Management Note (Signed)
Case Management Note Charlotte PieriniKristi Navjot Pilgrim RN, BSN Unit 4E-Case Manager-- 2H coverage [phone]  Patient Details  Name: Charlotte Salas MRN: [id number] Date of Birth: May 02, 1946  Subjective/Objective:    Pt admitted s/p Aortobifemoral bypass grafting on 02/09/17                Action/Plan: PTA pt lived at home- CM to follow for d/c needs.   Expected Discharge Date:                  Expected Discharge Plan:  Skilled Nursing Facility  In-House Referral:  Clinical Social Work  Discharge planning Services  CM Consult, Medication Assistance  Post Acute Care Choice:  NA Choice offered to:  NA  DME Arranged:    DME Agency:     HH Arranged:    HH Agency:     Status of Service:  Completed, signed off  If discussed at [employer] Length of Stay Meetings, dates discussed:    Discharge Disposition: skilled facility   Additional Comments:  02/18/17- 1500- Charlotte PieriniKristi Beva Remund RN, CM- pt s/p attempted cath today- stable now for discharge to SNF- [facility] - per insurance check no drug coverage found- spoke with pt at bedside- confirmed with pt that she has no prescription drug coverage and pays for medications out of pocket- uses [facility] and usually gets 90 day supply (total cost was less than $40 for all meds prior to admission)-  Informed pt about Entrestro and need to apply for assistance- application provided along with 30 day free card to use once discharged from SNF- call also made to Amy NP with HF team- [facility] for them to follow up on Entrestro needs. CSW working on placement to SNF- [facility].     Zenda AlpersWebster, Lenn SinkKristi Hall, RN 02/18/2017, 3:03 PM

## 2017-02-18 NOTE — Progress Notes (Signed)
Physical Therapy Treatment Patient Details Name: Charlotte Salas MRN: [id number] DOB: 11-Jun-1946 Today's Date: 02/18/2017    History of Present Illness This 71 y.o. female admitted for aorto bifemoral bypass graft secondary to PVD with bil. claudication,  and AAA.  PMH includes:  DM, HTN, MI, cervical CA, hodgkin's lymphoma, ischemic cardiomyopathy, CHF, atrial septal defect    PT Comments    Pt continues to make good progress towards her goals and is currently min guard for safety with bed mobility, transfers and ambulation of 400 feet with RW. Pt continues to requires skilled PT to progress her mobility and to improve her strength and endurance to safely mobilize in her discharge environment.  Follow Up Recommendations  SNF     Equipment Recommendations  3in1 (PT);Other (comment) (to be determined at next venue)    Recommendations for Other Services       Precautions / Restrictions Precautions Precautions: Fall Restrictions Weight Bearing Restrictions: No    Mobility  Bed Mobility Overal bed mobility: Needs Assistance Bed Mobility: Rolling;Sit to Sidelying Rolling: Min guard       Sit to sidelying: Min guard General bed mobility comments: min guard for safety in bringing LE back into bed  Transfers Overall transfer level: Needs assistance Equipment used: Rolling walker (2 wheeled) Transfers: Sit to/from Stand Sit to Stand: Min guard         General transfer comment: min guard for safety, good power up and steadying at [employer]  Ambulation/Gait Ambulation/Gait assistance: Min guard Ambulation Distance (Feet): 400 Feet Assistive device: Rolling walker (2 wheeled) Gait Pattern/deviations: Step-through pattern;Decreased stride length Gait velocity: decreased Gait velocity interpretation: Below normal speed for age/gender General Gait Details: min guard for safety; slow but steady gait;cues for RW management in hallway       Balance Overall balance assessment:  Needs assistance Sitting-balance support: Feet supported Sitting balance-Leahy Scale: Good     Standing balance support: No upper extremity supported Standing balance-Leahy Scale: Fair Standing balance comment: pt able to stand and don pants with single UE support                            Cognition Arousal/Alertness: Awake/alert Behavior During Therapy: WFL for tasks assessed/performed Overall Cognitive Status: Within Functional Limits for tasks assessed                                           General Comments General comments (skin integrity, edema, etc.): VSS      Pertinent Vitals/Pain Pain Assessment: 0-10 Pain Score: 7  Pain Location: abdomen  Pain Descriptors / Indicators: Guarding;Discomfort;Aching;Sore Pain Intervention(s): Limited activity within patient's tolerance;Monitored during session;Patient requesting pain meds-RN notified;RN gave pain meds during session           PT Goals (current goals can now be found in the care plan section) Acute Rehab PT Goals PT Goal Formulation: With patient Time For Goal Achievement: 02/17/17 Potential to Achieve Goals: Good    Frequency    Min 3X/week      PT Plan Current plan remains appropriate;Discharge plan needs to be updated       AM-PAC PT "6 Clicks" Daily Activity  Outcome Measure  Difficulty turning over in bed (including adjusting bedclothes, sheets and blankets)?: A Lot Difficulty moving from lying on back to sitting on the side of the  bed? : Unable Difficulty sitting down on and standing up from a chair with arms (e.g., wheelchair, bedside commode, etc,.)?: A Little Help needed moving to and from a bed to chair (including a wheelchair)?: A Little Help needed walking in hospital room?: A Little Help needed climbing 3-5 steps with a railing? : A Little 6 Click Score: 15    End of Session Equipment Utilized During Treatment: Gait belt Activity Tolerance: Patient  tolerated treatment well Patient left: in bed;with call bell/phone within reach Nurse Communication: Mobility status PT Visit Diagnosis: Unsteadiness on feet (R26.81);Other abnormalities of gait and mobility (R26.89);Muscle weakness (generalized) (M62.81);Difficulty in walking, not elsewhere classified (R26.2)     Time: 0981-1914 PT Time Calculation (min) (ACUTE ONLY): 18 min  Charges:  $Gait Training: 8-22 mins                    G Codes:       Hanako Tipping B. Beverely Risen PT, DPT Acute Rehabilitation  [phone] Pager [phone]     Elon Alas Fleet 02/18/2017, 5:54 PM

## 2017-02-18 NOTE — Interval H&P Note (Signed)
History and Physical Interval Note:  02/18/2017 7:42 AM  Charlotte Salas  has presented today for surgery, with the diagnosis of hf - hp  The various methods of treatment have been discussed with the patient and family. After consideration of risks, benefits and other options for treatment, the patient has consented to  Procedure(s): RIGHT/LEFT HEART CATH AND CORONARY ANGIOGRAPHY (N/A) and possible coronary angiography as a surgical intervention .  The patient's history has been reviewed, patient examined, no change in status, stable for surgery.  I have reviewed the patient's chart and labs.  Questions were answered to the patient's satisfaction.     Bensimhon, Reuel Boomaniel

## 2017-02-18 NOTE — Progress Notes (Signed)
  Progress Note    02/18/2017 1:27 PM Day of Surgery  Subjective:  No complaints  Afebrile VSS 99% RA  Vitals:   02/18/17 1231 02/18/17 1235  BP: (!) 142/78 (!) 142/78  Pulse:  99  Resp:  18  Temp:    SpO2:  96%    Physical Exam: Cardiac:  regular Lungs:  Non labored Incisions:  Right groin with mild clear drainage from proximal portion of incision.  Other incisions are healing nicely. Extremities:  Bilateral feet are warm Abdomen:  Soft, NT/ND; +BM yesterday  CBC    Component Value Date/Time   WBC 11.3 (H) 02/18/2017 1101   RBC 3.72 (L) 02/18/2017 1101   HGB 10.7 (L) 02/18/2017 1101   HCT 33.4 (L) 02/18/2017 1101   PLT 368 02/18/2017 1101   MCV 89.8 02/18/2017 1101   MCH 28.8 02/18/2017 1101   MCHC 32.0 02/18/2017 1101   RDW 14.7 02/18/2017 1101   LYMPHSABS 2.0 02/13/2017 2010   MONOABS 1.3 (H) 02/13/2017 2010   EOSABS 0.0 02/13/2017 2010   BASOSABS 0.0 02/13/2017 2010    BMET    Component Value Date/Time   NA 134 (L) 02/18/2017 0319   K 4.4 02/18/2017 0319   CL 101 02/18/2017 0319   CO2 26 02/18/2017 0319   GLUCOSE 111 (H) 02/18/2017 0319   BUN 14 02/18/2017 0319   CREATININE 1.02 (H) 02/18/2017 1101   CALCIUM 8.0 (L) 02/18/2017 0319   GFRNONAA 53 (L) 02/18/2017 1101   GFRAA >60 02/18/2017 1101    INR    Component Value Date/Time   INR 1.32 02/09/2017 1530     Intake/Output Summary (Last 24 hours) at 02/18/17 1327 Last data filed at 02/18/17 1208  Gross per 24 hour  Intake              250 ml  Output                0 ml  Net              250 ml     Assessment:  71 y.o. female is s/p:  Aortobifemoral bypass graft  Day of Surgery  Plan: -pt doing well this am-unable to perform cardiac cath and f/u is being set up for pt. -she does have minimal drainage from proximal right groin incision-leukocytosis is trending downward and she has been afebrile.  Incision appears to be healing nicely.  Discussed with pt to keep incision dry unless in  shower.  May need prophylactic abx given synthetic graft. -discussed with HF team and it is okay for pt to be discharged to rehab.  Dr. Darrick PennaFields to see pt.     Doreatha MassedSamantha Jenniefer Salak, PA-C [facility] [phone] 02/18/2017 1:27 PM

## 2017-02-18 NOTE — Progress Notes (Signed)
   HF meds for d/c Spironolactone 12.5 mg daily Entresto 24-26 mg twice a day Carvedilol 3.125 mg twice a day Simvastatin 20 mg daily Aspirin 81 mg daily  Care Management consult for entresto placed.   HF follow up with Dr Alycia RossettiBranch/[name] set up. [place] because there was no availability [place].   Kitara Hebb  NP-C  11:21 AM

## 2017-02-19 DIAGNOSIS — I714 Abdominal aortic aneurysm, without rupture: Secondary | ICD-10-CM

## 2017-02-19 LAB — BASIC METABOLIC PANEL
ANION GAP: 7 (ref 5–15)
BUN: 12 mg/dL (ref 6–20)
CALCIUM: 8.4 mg/dL — AB (ref 8.9–10.3)
CO2: 28 mmol/L (ref 22–32)
Chloride: 99 mmol/L — ABNORMAL LOW (ref 101–111)
Creatinine, Ser: 1 mg/dL (ref 0.44–1.00)
GFR calc Af Amer: >60 mL/min (ref >60)
GFR calc non Af Amer: 55 mL/min — ABNORMAL LOW (ref >60)
GLUCOSE: 140 mg/dL — AB (ref 65–99)
Potassium: 4.2 mmol/L (ref 3.5–5.1)
Sodium: 134 mmol/L — ABNORMAL LOW (ref 135–145)

## 2017-02-19 LAB — GLUCOSE, CAPILLARY: GLUCOSE-CAPILLARY: 115 mg/dL — AB (ref 65–99)

## 2017-02-19 MED ORDER — CEPHALEXIN 500 MG PO CAPS: 500.0000 mg | ORAL_CAPSULE | Freq: Two times a day (BID) | ORAL | 0 refills | Status: DC

## 2017-02-19 NOTE — Progress Notes (Signed)
Clinical Social Worker facilitated patient discharge including contacting patient family and facility to confirm patient discharge plans.  Clinical information faxed to facility and family agreeable with plan.  CSW [facility] [facility]  RN to call [phone] and [facility] prior to discharge.  Clinical Social Worker will sign off for now as social work intervention is no longer needed. Please consult us again if new need arises.  Marrianne MoodAshley Cherrish Vitali, MSW, [employer]WA [phone]

## 2017-02-19 NOTE — Progress Notes (Addendum)
[facility] [place]  Subjective  - Doing well, no new complaints   Objective 105/77 60 98.2 F (36.8 C) (Oral) 16 98%  Intake/Output Summary (Last 24 hours) at 02/19/17 0743 Last data filed at 02/18/17 1300  Gross per 24 hour  Intake              240 ml  Output                0 ml  Net              240 ml    Abdomin soft, incision healing well Groins soft incisions intact, provena wound vac placed B groins for 7 days Heat RRR Lungs non labored breathing  Assessment/Planning: Aortobifemoral bypass graft  Day of Surgery  Plan: -pt doing well this am-unable to perform cardiac cath and f/u is being set up for pt. -she does have minimal drainage from proximal right groin incision-leukocytosis is trending downward and she has been afebrile.  Incision appears to be healing nicely.   Will start Keflex 500 mg BID for 10 days and B groin provena wound vacs for 7 days synthetic graft.  Discharge to SNF  Clinton GallantCOLLINS, [facility] 02/19/2017 7:43 AM -- Agree with above.  Feels ok to go to Rehab. D/c  Fabienne Brunsharles Fields, MD [facility] [place] Office: [phone] Pager: [phone]  Laboratory Lab Results:  Recent Labs  02/18/17 1101  WBC 11.3*  HGB 10.7*  HCT 33.4*  PLT 368   BMET  Recent Labs  02/18/17 0319 02/18/17 1101 02/19/17 0226  NA 134*  --  134*  K 4.4  --  4.2  CL 101  --  99*  CO2 26  --  28  GLUCOSE 111*  --  140*  BUN 14  --  12  CREATININE 0.91 1.02* 1.00  CALCIUM 8.0*  --  8.4*    COAG Lab Results  Component Value Date   INR 1.32 02/09/2017   INR 0.97 02/07/2017   INR 0.9 07/14/2007   No results found for: PTT

## 2017-02-19 NOTE — Progress Notes (Signed)
Advanced Heart Failure Rounding Note  PCP: Dr Leary Roca  Primary Cardiologist: Dr Clifton James   Subjective:    Feels fine. Eager to go home. RHC numbers well compensated yesterday. Coronary angios not performed due to access issues    Objective:   Weight Range: 66.5 kg (146 lb 11.2 oz) Body mass index is 29.13 kg/m.   Vital Signs:   Temp:  [98.2 F (36.8 C)-98.4 F (36.9 C)] 98.2 F (36.8 C) (09/08 0438) Pulse Rate:  [57-99] 60 (09/07 1815) Resp:  [13-24] 16 (09/07 1815) BP: (42-148)/(34-78) 105/77 (09/08 0438) SpO2:  [95 %-98 %] 98 % (09/07 1815) Weight:  [66.5 kg (146 lb 11.2 oz)] 66.5 kg (146 lb 11.2 oz) (09/08 0438) Last BM Date: 02/18/17  Weight change: Filed Weights   02/17/17 0500 02/18/17 0336 02/19/17 0438  Weight: 66 kg (145 lb 8.1 oz) 66.5 kg (146 lb 9.6 oz) 66.5 kg (146 lb 11.2 oz)    Intake/Output:   Intake/Output Summary (Last 24 hours) at 02/19/17 1047 Last data filed at 02/18/17 1300  Gross per 24 hour  Intake              240 ml  Output                0 ml  Net              240 ml      Physical Exam   General:  Sitting in chair. No resp difficulty HEENT: normal Neck: supple. JVP 5 Carotids 2+ bilat; no bruits. No lymphadenopathy or thryomegaly appreciated. Cor: PMI nondisplaced. Regular rate & rhythm. No rubs, gallops or murmurs. Lungs: clear but decreased BS throughout no wheeze Abdomen: soft, nontender, nondistended. No hepatosplenomegaly. No bruits or masses. Good bowel sounds. Extremities: no cyanosis, clubbing, rash, edema  R groin with some serous drainage. Access sites ok from cath  Neuro: alert & orientedx3, cranial nerves grossly intact. moves all 4 extremities w/o difficulty. Affect pleasant  Telemetry   NSR 60s personally reviewed.   EKG    N/A  Labs    CBC  Recent Labs  02/18/17 1101  WBC 11.3*  HGB 10.7*  HCT 33.4*  MCV 89.8  PLT 368   Basic Metabolic Panel  Recent Labs  02/18/17 0319 02/18/17 1101  02/19/17 0226  NA 134*  --  134*  K 4.4  --  4.2  CL 101  --  99*  CO2 26  --  28  GLUCOSE 111*  --  140*  BUN 14  --  12  CREATININE 0.91 1.02* 1.00  CALCIUM 8.0*  --  8.4*   Liver Function Tests No results for input(s): AST, ALT, ALKPHOS, BILITOT, PROT, ALBUMIN in the last 72 hours. No results for input(s): LIPASE, AMYLASE in the last 72 hours. Cardiac Enzymes No results for input(s): CKTOTAL, CKMB, CKMBINDEX, TROPONINI in the last 72 hours.  BNP: BNP (last 3 results)  Recent Labs  02/14/17 0948  BNP 1,788.7*    ProBNP (last 3 results) No results for input(s): PROBNP in the last 8760 hours.   D-Dimer No results for input(s): DDIMER in the last 72 hours. Hemoglobin A1C No results for input(s): HGBA1C in the last 72 hours. Fasting Lipid Panel No results for input(s): CHOL, HDL, LDLCALC, TRIG, CHOLHDL, LDLDIRECT in the last 72 hours. Thyroid Function Tests No results for input(s): TSH, T4TOTAL, T3FREE, THYROIDAB in the last 72 hours.  Invalid input(s): FREET3  Other results:   Imaging  No results found.   Medications:     Scheduled Medications: . aspirin EC  81 mg Oral Daily  . atorvastatin  10 mg Oral q1800  . carvedilol  3.125 mg Oral BID WC  . enoxaparin (LOVENOX) injection  40 mg Subcutaneous Q24H  . insulin aspart  0-15 Units Subcutaneous TID WC  . pantoprazole  40 mg Oral Daily  . sacubitril-valsartan  1 tablet Oral BID  . sodium chloride flush  3 mL Intravenous Q12H  . spironolactone  12.5 mg Oral Daily    Infusions: . sodium chloride    . dextrose 5 % and 0.45% NaCl Stopped (02/16/17 1340)  . phenylephrine (NEO-SYNEPHRINE) Adult infusion Stopped (02/12/17 0500)    PRN Medications: sodium chloride, acetaminophen **OR** acetaminophen, acetaminophen, bisacodyl, guaiFENesin-dextromethorphan, hydrALAZINE, morphine injection, ondansetron, ondansetron (ZOFRAN) IV, oxyCODONE-acetaminophen, phenol, sodium chloride flush    Patient Profile    Charlotte Salas is a [age] year with  a history of ICM, CAD, S/P RCA BMS 2001, S/P LAD BMS 2009 RCA was occluded with collaterals, non-hodgkins lymphoma (completed chemo in 2001), HTN, hyperlipidemia, PAD, smoker, smoker, DMII, and cervical cancer.   Admitted with Aorto Bifem. Post op course complicated by acute respiratory failure and a/c systolic heart failure.    Assessment/Plan   1. PAD- S/P Aorto Bi Fem 8/29  - wounds healing but some mild serous drainage from R groin site . VVS following. Keflex started 2. Acute Respiratory Failure - Sats down to the 70s on 9/3. Improved with IV lasix.  -Resolved on room air.   3. A/C Systolic Heart Failure- ICM. ECHO this admit with EF 30-35%.  (Charlotte Salas reviewed EF 25%) - stable off milrinone. Volume status looks good. RHC yesterday with well compensated hemodynamics - see below for d/c cardiac meds 4. A fib RVR- started on IV amio.  - Remains in NSR. Was just post-op. AC and amio stopped  5. AKI -resolved 6.CAD- BMS RCA 2001 and BMS LAD 2009. Had elevated troponin 2.2>2.0>1.9. No CP.  On statin.  - unable to perform coronary angio due to access issues. Can consider re-attempt via left radial at some point 7. Smoker - discussed smoking cessation.  8. H/O non hodgkins lymphoma  2001 treated with chemo.  9. DMII- on sliding scale. Off metformin with elevated creatinine. Restart 48 hours after cath.    HF meds for d/c Spironolactone 12.5 mg daily Entresto 24-26 mg twice a day Carvedilol 3.125 mg twice a day Simvastatin 20 mg daily Aspirin 81 mg daily  HF follow up with Dr Alycia RossettiBranch/[name] set up. [place] was no [place].    Length of Stay: 10   [name], [name], MD  02/19/2017, 10:47 AM  Advanced Heart Failure Team Pager [phone] (M-F; 7a - 4p)  Please contact [facility] for night-coverage after hours (4p -7a ) and weekends on [email or web address]

## 2017-02-21 MED FILL — Verapamil HCl IV Soln 2.5 MG/ML: INTRAVENOUS | Qty: 2 | Status: AC

## 2017-02-28 NOTE — Progress Notes (Signed)
Cardiology Office Note   Date:  03/01/2017   ID:  Landi, Biscardi 11/28/1945, MRN [id number]  PCP:  Lorelei Pont, DO  Cardiologist:  Konrad Dolores)  No chief complaint on file.     History of Present Illness: Charlotte Salas is a 71 y.o. female who presents for post hospitalization follow up after admission for PAD with bilateral claudication, and AAA. Other history includes CAD s/p NSTEMI, diabetes, and hypertension. She underwent diagnostic angiography which revealed left distal external iliac and common femoral occlusion with severe stenosis of right common femoral artery.   She underwent operative procedure on 02/18/2017 per Dr. Myra Gianotti.  Procedure:#1: Aorto bifemoral bypass graft using 14 x 7 dacryon graft #2:R & G AAA #3: Bilateral superficial femoral and profunda femoral endarterectomy #4: Left profunda femoral patch angioplasty using bovine pericardial patch #5: Bilateral placement of incisional wound VAC  She had post operative atrial fibrillation and was started on amiodarone and metoprolol. She became bradycardic with pauses up to 3.6 seconds. Metoprolol was discontinued. With CHADS VASC Score of 6, but was not started on anticoagulation as it was felt that she was having post-operative atrial fib. She was resumed on ACE, statin, and continued on amiodarone.    Echocardiogram was completed. 02/12/2017 Left ventricle: The cavity size was normal. Wall thickness was   increased in a pattern of mild LVH. Systolic function was   moderately to severely reduced. The estimated ejection fraction   was in the range of 30% to 35%. Diffuse hypokinesis. The study is   not technically sufficient to allow evaluation of LV diastolic   function. - Mitral valve: Mildly thickened leaflets . There was moderate   regurgitation. - Left atrium: Moderately dilated. - Right ventricle: The  cavity size was mildly dilated. Systolic   function was normal. - Right atrium: Severely dilated. - Tricuspid valve: There was moderate regurgitation. - Pulmonary arteries: PA peak pressure: 45 mm Hg (S). - Inferior vena cava: The vessel was dilated. The respirophasic   diameter changes were blunted (< 50%), consistent with elevated   central venous pressure.  [facility] 02/18/2017 Procedures   RIGHT HEART CATH  Conclusion    Findings:  RA = 3 RV = 43/3 PA = 47/14 (25) PCW = 14 Fick cardiac output/index = 5.8/3.6 PVR = 1.9 WU Ao sat = 99% PA sat = 73%  Assessment: 1. Well compensated hemodynamics with mild PAH 2. Inability to perform coronary angiogram due to arterial access issues (see procedure note)  Plan/Discussion:  Unable to gain access to perform coronary angiography. This is related to no palpable pulse in the right radial area and no identifiable vessel by ultrasound. Femoral access was not attempted due to recent surgery. Medical management was recommended.   Continue medical therapy.  Bensimhon, Daniel, MD    She is still in SNF for ongoing PT post hospitalization. She is doing well, participating in her therapy and feeling stronger. She is going to live with her sister when she is discharged.  She would like to be taken off of the Entresto and coreg. She states that she wants to decrease the amount of medications she is taking.   Past Medical History:  Diagnosis Date  . Atrial septal defect    hx of it  . CAD (coronary artery disease)    s/p prior stenting of the right coronary artery in '01 and non-ST-elevation myocardial infarction in jan '09, with a new bare-metal stent placed in the ostium of the left  anterior descending coronary artert and the coronary anatomy as described above with total occlusion of the previously placed right coronary artery stent. EF 35-40%  . CHF (congestive heart failure) (HCC)   . Diabetes mellitus without complication (HCC)   .  Hepatitis 1971   " A"  . History of cervical cancer   . History of non-Hodgkin's lymphoma   . HTN (hypertension)   . HTN (hypertension)   . Hyperlipidemia   . Ischemic cardiomyopathy    EF 35-40% by 01/2016 echo  . Myocardial infarction (HCC) 2009, 2000  . Other and unspecified hyperlipidemia   . Peripheral vascular disease (HCC)   . Personal history of malignant neoplasm of cervix uteri   . Personal history of other lymphatic and hematopoietic neoplasm 2002   hodgkins lymphoma    Past Surgical History:  Procedure Laterality Date  . ABDOMINAL AORTOGRAM N/A 01/11/2017   Procedure: Abdominal Aortogram;  Surgeon: Nada Libman, MD;  Location: [facility]IVE [facility];  Service: Cardiovascular;  Laterality: N/A;  . ANGIOPLASTY Left 02/09/2017   Procedure: LEFT PROFUNDA  FEMORAL ARTERY ANGIOPLASTY USING XENOSURE BIOLOGIC PATCH;  Surgeon: Nada Libman, MD;  Location: [facility] OR;  Service: Vascular;  Laterality: Left;  . AORTA - BILATERAL FEMORAL ARTERY BYPASS GRAFT N/A 02/09/2017   Procedure: AORTA BIFEMORAL BYPASS GRAFT USING HEMASHIELD GOLD;  Surgeon: Nada Libman, MD;  Location: [facility] OR;  Service: Vascular;  Laterality: N/A;  . ASD AND VSD REPAIR    . LOWER EXTREMITY ANGIOGRAPHY Bilateral 01/11/2017   Procedure: Lower Extremity Angiography;  Surgeon: Nada Libman, MD;  Location: [facility];  Service: Cardiovascular;  Laterality: Bilateral;  . RIGHT HEART CATH N/A 02/18/2017   Procedure: RIGHT HEART CATH;  Surgeon: Dolores Patty, MD;  Location: [facility] INV[facility] CV [facility];  Service: Cardiovascular;  Laterality: N/A;  . VEIN SURGERY Bilateral Jan. Feb. and Mar. 2018   Dr. Earna Coder ([facility] in [place]     Current Outpatient Prescriptions  Medication Sig Dispense Refill  . aspirin EC 81 MG tablet Take 81 mg by mouth daily.    . B Complex Vitamins (B-COMPLEX/B-12 PO) Take 1 tablet by mouth daily.     . carvedilol (COREG) 3.125 MG tablet Take 1 tablet (3.125 mg total)  by mouth 2 (two) times daily with a meal. 60 tablet 6  . cephALEXin (KEFLEX) 500 MG capsule Take 1 capsule (500 mg total) by mouth 2 (two) times daily. 56 capsule 0  . Cranberry 1000 MG CAPS Take 1 capsule by mouth daily.     Marland Kitchen lisinopril (PRINIVIL,ZESTRIL) 10 MG tablet Take 10 mg by mouth daily.    . metFORMIN (GLUCOPHAGE) 500 MG tablet Take 500 mg by mouth daily with breakfast.     . nitroGLYCERIN (NITROSTAT) 0.4 MG SL tablet Place 1 tablet (0.4 mg total) under the tongue every 5 (five) minutes as needed for chest pain (for chest pain). 25 tablet 6  . omega-3 acid ethyl esters (LOVAZA) 1 g capsule Take by mouth 2 (two) times daily.    . pregabalin (LYRICA) 75 MG capsule Take 75 mg by mouth 2 (two) times daily.    . sacubitril-valsartan (ENTRESTO) 24-26 MG Take 1 tablet by mouth 2 (two) times daily. 60 tablet 6  . simvastatin (ZOCOR) 20 MG tablet Take 1 tablet (20 mg total) by mouth daily. 90 tablet 1  . spironolactone (ALDACTONE) 25 MG tablet Take 0.5 tablets (12.5 mg total) by mouth daily. 30 tablet  6  . traMADol (ULTRAM) 50 MG tablet Take 1 tablet (50 mg total) by mouth every 6 (six) hours as needed for moderate pain or severe pain. 30 tablet 0   No current facility-administered medications for this visit.     Allergies:   Codeine    Social History:  The patient  reports that she has been smoking Cigarettes and E-cigarettes.  She has a 12.50 pack-year smoking history. She has never used smokeless tobacco. She reports that she does not drink alcohol or use drugs.   Family History:  The patient's family history includes Heart Problems in her mother; Heart attack in her sister; Leukemia in her father.    ROS: All other systems are reviewed and negative. Unless otherwise mentioned in H&P    PHYSICAL EXAM: VS:  BP 110/62 (BP Location: Left Arm)   Pulse (!) 59   Ht  (1.575 m)   Wt 134 lb (60.8 kg)   SpO2 95%   BMI 24.51 kg/m  , BMI Body mass index is 24.51 kg/m. GEN: Well  nourished, well developed, in no acute distress  HEENT: normal  Neck: no JVD, carotid bruits, or masses Cardiac: RRR; no murmurs, rubs, or gallops,no edema  Respiratory:  clear to auscultation bilaterally, normal work of breathing GI: soft, nontender, nondistended, + BS MS: no deformity or atrophy Midline incision is well healed with large amount of scabs, but not signs of infection or evisceration. Good pulses 2+ pulses  DP and PT. .  Skin: warm and dry, no rash Neuro:  Strength and sensation are intact Psych: euthymic mood, full affect  Recent Labs: 02/10/2017: Magnesium 2.3 02/11/2017: ALT 30 02/14/2017: B Natriuretic Peptide 1,788.7 02/18/2017: Hemoglobin 10.7; Platelets 368 02/19/2017: BUN 12; Creatinine, Ser 1.00; Potassium 4.2; Sodium 134    Lipid Panel    Component Value Date/Time   CHOL 126 01/19/2016 1115   TRIG 206 (H) 01/19/2016 1115   HDL 33 (L) 01/19/2016 1115   CHOLHDL 3.8 01/19/2016 1115   VLDL 41 (H) 01/19/2016 1115   LDLCALC 52 01/19/2016 1115   LDLDIRECT 61.4 05/11/2010 1116      Wt Readings from Last 3 Encounters:  03/01/17 134 lb (60.8 kg)  02/19/17 146 lb 11.2 oz (66.5 kg)  02/07/17 136 lb 9.6 oz (62 kg)      ASSESSMENT AND PLAN:  1. Chronic Systolic CHF:  Most recent echocardiogram demonstrated EF of 30-35%. I have spent a good bit of time explaining the need for BP and HR control in this setting. She is to continue to take Entresto and Coreg. She verbalizes understanding. She is to watch salt intake and weigh daily as she is doing. No signs of volume overload currently.  Cardiac catheterization was completed during hospitalization, but was unable to gain access to perform coronary angiography. This is related to no palpable pulse in the right radial area and no identifiable vessel by ultrasound. Femoral access was not attempted due to recent surgery. Medical management was recommended.  2. Status post aortofemoral bypass: Doing well, no evidence of  infection, she is gaining strength in her legs and states that the numbness and tingling have significantly improved. She feels warm in her left leg now and is encouraged by her response to the surgery. She is continuing physical therapy and is gaining strength each day.  3. Hypertension: Low normal blood pressure. I have explained to her the need to keep blood pressure under control with reduced EF. She verbalizes understanding.  Current medicines are reviewed at length with the patient today.  Spent greater than 30 minutes with this patient explaining medications and during assessment.  Labs/ tests ordered today include:  Bettey Mare. Liborio Nixon, ANP, AACC   03/01/2017 4:02 PM    [facility] [address], [place], [place] [address] Phone: [phone]; Fax: [phone]

## 2017-03-01 ENCOUNTER — Encounter: Payer: Self-pay | Admitting: Adult Health

## 2017-03-01 ENCOUNTER — Ambulatory Visit (INDEPENDENT_AMBULATORY_CARE_PROVIDER_SITE_OTHER): Payer: Medicare Other | Admitting: Adult Health

## 2017-03-01 VITALS — BP 110/62 | HR 59 | Ht 62.0 in | Wt 134.0 lb

## 2017-03-01 DIAGNOSIS — I5022 Chronic systolic (congestive) heart failure: Secondary | ICD-10-CM | POA: Diagnosis not present

## 2017-03-01 DIAGNOSIS — I739 Peripheral vascular disease, unspecified: Secondary | ICD-10-CM

## 2017-03-01 DIAGNOSIS — I10 Essential (primary) hypertension: Secondary | ICD-10-CM

## 2017-03-01 DIAGNOSIS — I714 Abdominal aortic aneurysm, without rupture, unspecified: Secondary | ICD-10-CM

## 2017-03-01 DIAGNOSIS — I6523 Occlusion and stenosis of bilateral carotid arteries: Secondary | ICD-10-CM

## 2017-03-01 NOTE — Patient Instructions (Signed)
Medication Instructions:  Your physician recommends that you continue on your current medications as directed. Please refer to the Current Medication list given to you today.   Labwork: NONE  Testing/Procedures: NONE  Follow-Up: Your physician recommends that you schedule a follow-up appointment in: 2 MONTHS    Any Other Special Instructions Will Be Listed Below (If Applicable).     If you need a refill on your cardiac medications before your next appointment, please call your pharmacy.   

## 2017-03-17 ENCOUNTER — Telehealth: Payer: Self-pay | Admitting: *Deleted

## 2017-03-17 NOTE — Telephone Encounter (Signed)
Sister, Britta Mccreedy called stating that patient was having back pain.  She states that patient does not have a fever but does not have an appetite.  Patient states that her pain scale is 4, however has been to a 6-7.  Britta Mccreedy states that she has been only lying on the couch with only walking to the bathroom.  She is going to have her walking this evening to see if increased activity will improve from just the flat position on the couch.  Britta Mccreedy is to call if this actually improves or worsens the discomfort.  I advised her if the pain worsens to report to the ED.  Patient and sister voiced understanding of the instructions.

## 2017-03-30 ENCOUNTER — Encounter: Payer: Medicare Other | Admitting: Surgery

## 2017-04-04 ENCOUNTER — Ambulatory Visit (INDEPENDENT_AMBULATORY_CARE_PROVIDER_SITE_OTHER): Payer: Self-pay | Admitting: Surgery

## 2017-04-04 ENCOUNTER — Encounter: Payer: Self-pay | Admitting: Surgery

## 2017-04-04 ENCOUNTER — Other Ambulatory Visit: Payer: Self-pay

## 2017-04-04 VITALS — BP 134/77 | HR 54 | Temp 97.6°F | Resp 18 | Ht 62.0 in | Wt 139.0 lb

## 2017-04-04 DIAGNOSIS — I714 Abdominal aortic aneurysm, without rupture, unspecified: Secondary | ICD-10-CM

## 2017-04-04 DIAGNOSIS — I739 Peripheral vascular disease, unspecified: Secondary | ICD-10-CM

## 2017-04-04 DIAGNOSIS — I70213 Atherosclerosis of native arteries of extremities with intermittent claudication, bilateral legs: Secondary | ICD-10-CM

## 2017-04-04 MED ORDER — TRAMADOL HCL 50 MG PO TABS: 50.0000 mg | ORAL_TABLET | Freq: Four times a day (QID) | ORAL | 0 refills | Status: AC | PRN

## 2017-04-04 MED ORDER — TRAMADOL HCL 50 MG PO TABS: 50.0000 mg | ORAL_TABLET | Freq: Four times a day (QID) | ORAL | 0 refills | Status: DC | PRN

## 2017-04-04 NOTE — Progress Notes (Signed)
Patient name: Charlotte Salas MRN: [id number] DOB: 09-16-1945 Sex: female  REASON FOR VISIT:     post op  HISTORY OF PRESENT ILLNESS:   Charlotte Salas is a 71 y.o. female who is back for her first postoperative follow-up.  She is status post aortobifemoral bypass graft using a 14 x 7 graft on 02/09/2017.  This was done for both aneurysmal and occlusive disease.  She initially presented for evaluation of leg pain.  She had undergone a venous ablation and ankle [place] that did not help her symptoms.  She states that she is doing well and her leg pain has essentially resolved.  She still has some numbness in her toes that was present before surgery.  She also has some numbness in both of her thighs.  Her appetite is back to normal.  She does not have issues with constipation.  She is taking very little pain medication.  The patient is a diabetic.  Her A1c ranges 5.8-6.8.  She has a history of coronary artery disease and is status post non-STEMI.  She has been stented in the past.  She is medically managed for hypertension with ACE inhibitor.  She takes a statin for hypercholesterolemia.  She is a current smoker.  CURRENT MEDICATIONS:    Current Outpatient Prescriptions  Medication Sig Dispense Refill  . aspirin EC 81 MG tablet Take 81 mg by mouth daily.    . B Complex Vitamins (B-COMPLEX/B-12 PO) Take 1 tablet by mouth daily.     . carvedilol (COREG) 3.125 MG tablet Take 1 tablet (3.125 mg total) by mouth 2 (two) times daily with a meal. 60 tablet 6  . cephALEXin (KEFLEX) 500 MG capsule Take 1 capsule (500 mg total) by mouth 2 (two) times daily. 56 capsule 0  . Cranberry 1000 MG CAPS Take 1 capsule by mouth daily.     Marland Kitchen lisinopril (PRINIVIL,ZESTRIL) 10 MG tablet Take 10 mg by mouth daily.    . metFORMIN (GLUCOPHAGE) 500 MG tablet Take 500 mg by mouth daily with breakfast.     . nitroGLYCERIN (NITROSTAT) 0.4 MG SL tablet Place 1 tablet (0.4 mg total) under  the tongue every 5 (five) minutes as needed for chest pain (for chest pain). 25 tablet 6  . omega-3 acid ethyl esters (LOVAZA) 1 g capsule Take by mouth 2 (two) times daily.    . pregabalin (LYRICA) 75 MG capsule Take 75 mg by mouth 2 (two) times daily.    . sacubitril-valsartan (ENTRESTO) 24-26 MG Take 1 tablet by mouth 2 (two) times daily. 60 tablet 6  . simvastatin (ZOCOR) 20 MG tablet Take 1 tablet (20 mg total) by mouth daily. 90 tablet 1  . spironolactone (ALDACTONE) 25 MG tablet Take 0.5 tablets (12.5 mg total) by mouth daily. 30 tablet 6  . traMADol (ULTRAM) 50 MG tablet Take 1 tablet (50 mg total) by mouth every 6 (six) hours as needed for moderate pain or severe pain. (Patient not taking: Reported on 04/04/2017) 30 tablet 0   No current facility-administered medications for this visit.     REVIEW OF SYSTEMS:   [X]  denotes positive finding, [ ]  denotes negative finding Cardiac  Comments:  Chest pain or chest pressure:    Shortness of breath upon exertion:    Short of breath when lying flat:    Irregular heart rhythm:    Constitutional    Fever or chills:      PHYSICAL EXAM:   Vitals:   04/04/17 0901  BP: 134/77  Pulse: (!) 54  Resp: 18  Temp: 97.6 F (36.4 C)  TempSrc: Oral  SpO2: 99%  Weight: 139 lb (63 kg)  Height: 5\' 2"  (1.575 m)    GENERAL: The patient is a well-nourished female, in no acute distress. The vital signs are documented above. CARDIOVASCULAR: There is a regular rate and rhythm. PULMONARY: Non-labored respirations Midline and femoral incisions are well healed with no evidence of hernia or separation.  Palpable femoral pulses.  Do not palpate pedal pulses.  STUDIES:   None   MEDICAL ISSUES:   Status post aortobifemoral bypass graft.  The patient is recovering nicely.  I suspect the numbness in her medial thigh region will resolve over time.  I am not sure about the numbness in her toes.  I suspect this will persist as she has been dealing with  for a long time.  I have her scheduled for follow-up in 3 months.  Charlotte CalWells Tigerlily Christine, MD [facility] Tel [phone] Pager [phone]

## 2017-04-13 NOTE — Addendum Note (Signed)
Addended by: Burton ApleyPETTY, Judithann Villamar A on: 04/13/2017 01:08 PM   Modules accepted: Orders

## 2017-05-03 ENCOUNTER — Ambulatory Visit: Payer: Medicare Other | Admitting: Cardiology

## 2017-05-19 ENCOUNTER — Telehealth: Payer: Self-pay | Admitting: Cardiology

## 2017-05-19 NOTE — Telephone Encounter (Signed)
Patient called stating doctor that manages her diabetes wants to her to have an ECHO

## 2017-05-19 NOTE — Telephone Encounter (Signed)
Patient advised that she had an echo on 02/12/17 and that we could forward a copy of this report to her PCP. Patient stated that she saw her PCP recently and had a lot of lab work on yesterday for swelling in her legs and sob. Patient stated that she was given furosemide to take temporarily. Patient is scheduled to be seen by Dr. Wyline MoodBranch on 07/02/17. Nurse offered a sooner appointment for patient but she declined stating that she could wait until 07/02/17. Patient said that she would take the furosemide the way it was prescribed. Patient advised to have her PCP contact our office if her PCP felt she needed a sooner appointment. Patient verbalized understanding of plan. Copy of last office note and last echo sent to PCP Stoney Bang(Melinda Winikur NP).

## 2017-05-20 MED ORDER — FUROSEMIDE 40 MG PO TABS: 40.0000 mg | ORAL_TABLET | Freq: Every day | ORAL | 0 refills | Status: DC

## 2017-05-20 NOTE — Addendum Note (Signed)
Addended by: Eustace MooreANDERSON, Dorian Duval M on: 05/20/2017 11:42 AM   Modules accepted: Orders

## 2017-05-20 NOTE — Telephone Encounter (Addendum)
Spoke with patient and she confirmed that she is taking furosemide 20 mg by mouth daily. Patient said that she weighed 145 lbs at her PCP visit on 05/09/17. Patient has not weighed since starting furosemide. Nurse asked patient to weigh while on phone and she weighed 150 lbs in PJ[employer] and had eaten a banana and coffee today. Per patient, the swelling and sob has not improved and she is sleeping with 2 pillows.    Dr. Wyline MoodBranch contacted and given updated information. Per Dr. Wyline MoodBranch, Increase lasix to 40 mg daily, and patient should call the office on Tuesday with an update on weight, swelling and sob. Patient advised to weigh daily and check BP and HR daily. Medications reconciled while on the phone with patient. Patient verbalized understanding of plan.

## 2017-05-20 NOTE — Telephone Encounter (Signed)
She has known heart failure and if having increased edema and SOB she likely is having an exacerbation. I don't think a repeat echo is needed. Lasix is the right treatment. What dose have they started? What have her home weights been and have they been trending up or down.    Dominga FerryJ Salomon Ganser MD

## 2017-05-24 ENCOUNTER — Telehealth: Payer: Self-pay | Admitting: *Deleted

## 2017-05-24 NOTE — Telephone Encounter (Signed)
Patient called to c/o  Bilateral lower extremity swelling "feet to lower thigh and feet look like sausages" states she called her cardiologist but office is not open. Denies pain but states she is having trouble breathing. Instructed to call 911 and get to closest ER. She states she is alone and lives in [place].

## 2017-05-27 ENCOUNTER — Telehealth: Payer: Self-pay | Admitting: Cardiology

## 2017-05-27 ENCOUNTER — Encounter: Payer: Self-pay | Admitting: *Deleted

## 2017-05-27 NOTE — Telephone Encounter (Signed)
[facility]

## 2017-05-27 NOTE — Telephone Encounter (Signed)
Pt says [facility] until yesterday for chest pain/SOB - says lasix was increased to 40 mg bid and started KCl 20 mEq daily - says no other medication changes were made - requested records from hospital - pt says she is still having SOB and wanted to know if she should be seen prior to 1/16 OV with Dr Wyline MoodBranch - pt denies any weight gain/swelling/chest pain since d/c. Pt aware that if symptoms of SOB worsen or starts having chest pain she should have evaluation at ED - will await records and forward to Dr Wyline MoodBranch

## 2017-05-27 NOTE — Telephone Encounter (Signed)
Patient called stating that she was [facility]. She has upcoming appointment Jan 2019 but Was told that she needs to have follow up before then. She states that she is obtaining fluid.   Will need to obtain records and advise.

## 2017-05-30 ENCOUNTER — Encounter (HOSPITAL_COMMUNITY): Payer: Self-pay | Admitting: *Deleted

## 2017-05-30 ENCOUNTER — Inpatient Hospital Stay (HOSPITAL_COMMUNITY)
Admission: EM | Admit: 2017-05-30 | Discharge: 2017-06-10 | DRG: 246 | Disposition: A | Discharge status: Home Health | Payer: Medicare Other | Attending: Internal Medicine | Admitting: Internal Medicine

## 2017-05-30 ENCOUNTER — Emergency Department (HOSPITAL_COMMUNITY): Payer: Medicare Other

## 2017-05-30 ENCOUNTER — Other Ambulatory Visit: Payer: Self-pay

## 2017-05-30 DIAGNOSIS — I480 Paroxysmal atrial fibrillation: Secondary | ICD-10-CM | POA: Diagnosis present

## 2017-05-30 DIAGNOSIS — Z7982 Long term (current) use of aspirin: Secondary | ICD-10-CM

## 2017-05-30 DIAGNOSIS — I130 Hypertensive heart and chronic kidney disease with heart failure and stage 1 through stage 4 chronic kidney disease, or unspecified chronic kidney disease: Secondary | ICD-10-CM | POA: Diagnosis not present

## 2017-05-30 DIAGNOSIS — E785 Hyperlipidemia, unspecified: Secondary | ICD-10-CM | POA: Diagnosis present

## 2017-05-30 DIAGNOSIS — R7401 Elevation of levels of liver transaminase levels: Secondary | ICD-10-CM

## 2017-05-30 DIAGNOSIS — I4892 Unspecified atrial flutter: Secondary | ICD-10-CM | POA: Diagnosis present

## 2017-05-30 DIAGNOSIS — R32 Unspecified urinary incontinence: Secondary | ICD-10-CM | POA: Diagnosis present

## 2017-05-30 DIAGNOSIS — Z8571 Personal history of Hodgkin lymphoma: Secondary | ICD-10-CM

## 2017-05-30 DIAGNOSIS — I5043 Acute on chronic combined systolic (congestive) and diastolic (congestive) heart failure: Secondary | ICD-10-CM | POA: Diagnosis present

## 2017-05-30 DIAGNOSIS — I447 Left bundle-branch block, unspecified: Secondary | ICD-10-CM | POA: Diagnosis present

## 2017-05-30 DIAGNOSIS — K8020 Calculus of gallbladder without cholecystitis without obstruction: Secondary | ICD-10-CM | POA: Diagnosis present

## 2017-05-30 DIAGNOSIS — Z6825 Body mass index (BMI) 25.0-25.9, adult: Secondary | ICD-10-CM

## 2017-05-30 DIAGNOSIS — Z955 Presence of coronary angioplasty implant and graft: Secondary | ICD-10-CM

## 2017-05-30 DIAGNOSIS — Z9221 Personal history of antineoplastic chemotherapy: Secondary | ICD-10-CM

## 2017-05-30 DIAGNOSIS — R740 Nonspecific elevation of levels of transaminase and lactic acid dehydrogenase [LDH]: Secondary | ICD-10-CM

## 2017-05-30 DIAGNOSIS — E1151 Type 2 diabetes mellitus with diabetic peripheral angiopathy without gangrene: Secondary | ICD-10-CM | POA: Diagnosis present

## 2017-05-30 DIAGNOSIS — K761 Chronic passive congestion of liver: Secondary | ICD-10-CM | POA: Diagnosis present

## 2017-05-30 DIAGNOSIS — I255 Ischemic cardiomyopathy: Secondary | ICD-10-CM | POA: Diagnosis present

## 2017-05-30 DIAGNOSIS — Z8572 Personal history of non-Hodgkin lymphomas: Secondary | ICD-10-CM

## 2017-05-30 DIAGNOSIS — F17290 Nicotine dependence, other tobacco product, uncomplicated: Secondary | ICD-10-CM | POA: Diagnosis present

## 2017-05-30 DIAGNOSIS — I5023 Acute on chronic systolic (congestive) heart failure: Secondary | ICD-10-CM

## 2017-05-30 DIAGNOSIS — Z79899 Other long term (current) drug therapy: Secondary | ICD-10-CM

## 2017-05-30 DIAGNOSIS — N183 Chronic kidney disease, stage 3 (moderate): Secondary | ICD-10-CM | POA: Diagnosis present

## 2017-05-30 DIAGNOSIS — I509 Heart failure, unspecified: Secondary | ICD-10-CM

## 2017-05-30 DIAGNOSIS — Z66 Do not resuscitate: Secondary | ICD-10-CM | POA: Diagnosis present

## 2017-05-30 DIAGNOSIS — I2510 Atherosclerotic heart disease of native coronary artery without angina pectoris: Secondary | ICD-10-CM | POA: Diagnosis present

## 2017-05-30 DIAGNOSIS — I5082 Biventricular heart failure: Secondary | ICD-10-CM | POA: Diagnosis present

## 2017-05-30 DIAGNOSIS — J449 Chronic obstructive pulmonary disease, unspecified: Secondary | ICD-10-CM | POA: Diagnosis present

## 2017-05-30 DIAGNOSIS — Z923 Personal history of irradiation: Secondary | ICD-10-CM

## 2017-05-30 DIAGNOSIS — J9811 Atelectasis: Secondary | ICD-10-CM | POA: Diagnosis present

## 2017-05-30 DIAGNOSIS — I252 Old myocardial infarction: Secondary | ICD-10-CM

## 2017-05-30 DIAGNOSIS — Z7984 Long term (current) use of oral hypoglycemic drugs: Secondary | ICD-10-CM

## 2017-05-30 DIAGNOSIS — Z720 Tobacco use: Secondary | ICD-10-CM | POA: Diagnosis present

## 2017-05-30 DIAGNOSIS — E1169 Type 2 diabetes mellitus with other specified complication: Secondary | ICD-10-CM | POA: Diagnosis not present

## 2017-05-30 DIAGNOSIS — I472 Ventricular tachycardia: Secondary | ICD-10-CM | POA: Diagnosis present

## 2017-05-30 DIAGNOSIS — E1122 Type 2 diabetes mellitus with diabetic chronic kidney disease: Secondary | ICD-10-CM | POA: Diagnosis present

## 2017-05-30 DIAGNOSIS — Z885 Allergy status to narcotic agent status: Secondary | ICD-10-CM

## 2017-05-30 DIAGNOSIS — K7200 Acute and subacute hepatic failure without coma: Secondary | ICD-10-CM | POA: Diagnosis present

## 2017-05-30 DIAGNOSIS — I714 Abdominal aortic aneurysm, without rupture, unspecified: Secondary | ICD-10-CM | POA: Diagnosis present

## 2017-05-30 DIAGNOSIS — R0602 Shortness of breath: Secondary | ICD-10-CM | POA: Diagnosis not present

## 2017-05-30 DIAGNOSIS — Z8541 Personal history of malignant neoplasm of cervix uteri: Secondary | ICD-10-CM

## 2017-05-30 DIAGNOSIS — Z452 Encounter for adjustment and management of vascular access device: Secondary | ICD-10-CM

## 2017-05-30 DIAGNOSIS — N179 Acute kidney failure, unspecified: Secondary | ICD-10-CM

## 2017-05-30 LAB — BASIC METABOLIC PANEL
Anion gap: 15 (ref 5–15)
BUN: 55 mg/dL — ABNORMAL HIGH (ref 6–20)
CALCIUM: 9.1 mg/dL (ref 8.9–10.3)
CHLORIDE: 95 mmol/L — AB (ref 101–111)
CO2: 27 mmol/L (ref 22–32)
CREATININE: 1.69 mg/dL — AB (ref 0.44–1.00)
GFR, EST AFRICAN AMERICAN: 34 mL/min — AB (ref >60)
GFR, EST NON AFRICAN AMERICAN: 29 mL/min — AB (ref >60)
Glucose, Bld: 145 mg/dL — ABNORMAL HIGH (ref 65–99)
Potassium: 4.7 mmol/L (ref 3.5–5.1)
SODIUM: 137 mmol/L (ref 135–145)

## 2017-05-30 LAB — BRAIN NATRIURETIC PEPTIDE: B NATRIURETIC PEPTIDE 5: 2426 pg/mL — AB (ref 0.0–100.0)

## 2017-05-30 LAB — CBC
HCT: 40.4 % (ref 36.0–46.0)
Hemoglobin: 12.7 g/dL (ref 12.0–15.0)
MCH: 26.8 pg (ref 26.0–34.0)
MCHC: 31.4 g/dL (ref 30.0–36.0)
MCV: 85.2 fL (ref 78.0–100.0)
PLATELETS: 192 10*3/uL (ref 150–400)
RBC: 4.74 MIL/uL (ref 3.87–5.11)
RDW: 14.8 % (ref 11.5–15.5)
WBC: 10.7 10*3/uL — AB (ref 4.0–10.5)

## 2017-05-30 LAB — HEPATIC FUNCTION PANEL
ALBUMIN: 3.2 g/dL — AB (ref 3.5–5.0)
ALT: 431 U/L — ABNORMAL HIGH (ref 14–54)
AST: 304 U/L — AB (ref 15–41)
Alkaline Phosphatase: 177 U/L — ABNORMAL HIGH (ref 38–126)
Bilirubin, Direct: 0.4 mg/dL (ref 0.1–0.5)
Indirect Bilirubin: 0.7 mg/dL (ref 0.3–0.9)
TOTAL PROTEIN: 6.5 g/dL (ref 6.5–8.1)
Total Bilirubin: 1.1 mg/dL (ref 0.3–1.2)

## 2017-05-30 LAB — TROPONIN I: TROPONIN I: 0.06 ng/mL — AB (ref <0.03)

## 2017-05-30 MED ORDER — SIMVASTATIN 20 MG PO TABS
20.0000 mg | ORAL_TABLET | Freq: Every day | ORAL | Status: DC
  Administered 2017-05-30 – 2017-06-08 (×10): 20 mg via ORAL
  Filled 2017-05-30 (×10): qty 1

## 2017-05-30 MED ORDER — ENOXAPARIN SODIUM 30 MG/0.3ML ~~LOC~~ SOLN
30.0000 mg | SUBCUTANEOUS | Status: DC
  Administered 2017-05-30 – 2017-06-03 (×5): 30 mg via SUBCUTANEOUS
  Filled 2017-05-30 (×5): qty 0.3

## 2017-05-30 MED ORDER — ACETAMINOPHEN 650 MG RE SUPP: 650.0000 mg | Freq: Four times a day (QID) | RECTAL | Status: DC | PRN

## 2017-05-30 MED ORDER — CARVEDILOL 12.5 MG PO TABS
12.5000 mg | ORAL_TABLET | Freq: Two times a day (BID) | ORAL | Status: DC
  Administered 2017-05-30: 12.5 mg via ORAL
  Filled 2017-05-30: qty 1

## 2017-05-30 MED ORDER — ACETAMINOPHEN 325 MG PO TABS: 650.0000 mg | ORAL_TABLET | Freq: Four times a day (QID) | ORAL | Status: DC | PRN

## 2017-05-30 MED ORDER — SODIUM CHLORIDE 0.9% FLUSH
3.0000 mL | Freq: Two times a day (BID) | INTRAVENOUS | Status: DC
  Administered 2017-05-31 – 2017-06-05 (×10): 3 mL via INTRAVENOUS

## 2017-05-30 MED ORDER — FUROSEMIDE 10 MG/ML IJ SOLN
40.0000 mg | Freq: Once | INTRAMUSCULAR | Status: AC
  Administered 2017-05-30: 40 mg via INTRAVENOUS
  Filled 2017-05-30: qty 4

## 2017-05-30 MED ORDER — ASPIRIN EC 81 MG PO TBEC
81.0000 mg | DELAYED_RELEASE_TABLET | Freq: Every evening | ORAL | Status: DC
  Administered 2017-05-31 – 2017-06-09 (×9): 81 mg via ORAL
  Filled 2017-05-30 (×9): qty 1

## 2017-05-30 MED ORDER — PREGABALIN 25 MG PO CAPS
75.0000 mg | ORAL_CAPSULE | Freq: Every day | ORAL | Status: DC
  Administered 2017-05-30 – 2017-06-09 (×11): 75 mg via ORAL
  Filled 2017-05-30 (×2): qty 1
  Filled 2017-05-30 (×2): qty 3
  Filled 2017-05-30 (×7): qty 1

## 2017-05-30 MED ORDER — ONDANSETRON HCL 4 MG PO TABS: 4.0000 mg | ORAL_TABLET | Freq: Four times a day (QID) | ORAL | Status: DC | PRN

## 2017-05-30 MED ORDER — SODIUM CHLORIDE 0.9% FLUSH
3.0000 mL | INTRAVENOUS | Status: DC | PRN
  Filled 2017-05-30: qty 3

## 2017-05-30 MED ORDER — TRAMADOL HCL 50 MG PO TABS
50.0000 mg | ORAL_TABLET | Freq: Four times a day (QID) | ORAL | Status: DC | PRN
  Administered 2017-05-30 – 2017-06-09 (×8): 50 mg via ORAL
  Filled 2017-05-30 (×8): qty 1

## 2017-05-30 MED ORDER — FUROSEMIDE 10 MG/ML IJ SOLN
20.0000 mg | Freq: Two times a day (BID) | INTRAMUSCULAR | Status: DC
  Administered 2017-05-31: 20 mg via INTRAVENOUS
  Filled 2017-05-30: qty 2

## 2017-05-30 MED ORDER — ONDANSETRON HCL 4 MG/2ML IJ SOLN: 4.0000 mg | Freq: Four times a day (QID) | INTRAMUSCULAR | Status: DC | PRN

## 2017-05-30 MED ORDER — INSULIN ASPART 100 UNIT/ML ~~LOC~~ SOLN
0.0000 [IU] | Freq: Three times a day (TID) | SUBCUTANEOUS | Status: DC
  Administered 2017-05-31: 3 [IU] via SUBCUTANEOUS
  Administered 2017-05-31: 2 [IU] via SUBCUTANEOUS
  Administered 2017-06-01: 3 [IU] via SUBCUTANEOUS

## 2017-05-30 MED ORDER — SODIUM CHLORIDE 0.9 % IV SOLN: 250.0000 mL | INTRAVENOUS | Status: DC | PRN

## 2017-05-30 NOTE — Telephone Encounter (Signed)
Can we check in on this patient and see how her symptoms are doing? I may try to squeezer her in Dec 28th in my clinic or possibly arrange a nursing visit earlier. How is her breathing, swelling, and weights (if she has several days of weights that would be great, if not needs to start keeping track)   Dominga FerryJ Karma Ansley MD

## 2017-05-30 NOTE — Telephone Encounter (Signed)
Patient returned call stating she was having her sister take her to [facility].

## 2017-05-30 NOTE — ED Triage Notes (Addendum)
Pt c/o SOB, abdominal swelling, leg and ankle swelling x 3-4 weeks. Dr. Wyline MoodBranch advised pt to increase her Lasix but hasn't seen a change in her breathing or swelling. Pt also reports problems with her thinking over the past week.

## 2017-05-30 NOTE — ED Provider Notes (Signed)
[facility] EMERGENCY DEPARTMENT Provider Note   CSN: [id number] Arrival date & time: 05/30/17  1612     History   Chief Complaint Chief Complaint  Patient presents with  . Shortness of Breath    HPI Charlotte Salas is a 71 y.o. female.  HPI 71 year old female who presents with shortness of breath.  She has a history of coronary artery disease status post stenting, chronic systolic heart failure with EF of 30-35%, hypertension, diabetes, paroxysmal atrial fibrillation.  Reports recently being discharged from the hospital in [place] 3 days ago after admission for CHF exacerbation.  Her family states that she was diuresed about 20 pounds.  States that upon discharge from the hospital, she continued to have shortness of breath with activity.  States that she has had gradually worsening orthopnea needing to now sleep upright.  This morning she weighed at 150 pounds.  She is unsure what her dry weight is.   Past Medical History:  Diagnosis Date  . Atrial septal defect    hx of it  . CAD (coronary artery disease)    s/p prior stenting of the right coronary artery in '01 and non-ST-elevation myocardial infarction in jan '09, with a new bare-metal stent placed in the ostium of the left anterior descending coronary artert and the coronary anatomy as described above with total occlusion of the previously placed right coronary artery stent. EF 35-40%  . CHF (congestive heart failure) (HCC)   . Diabetes mellitus without complication (HCC)   . Hepatitis 1971   " A"  . History of cervical cancer   . History of non-Hodgkin's lymphoma   . HTN (hypertension)   . HTN (hypertension)   . Hyperlipidemia   . Ischemic cardiomyopathy    EF 35-40% by 01/2016 echo  . Myocardial infarction (HCC) 2009, 2000  . Other and unspecified hyperlipidemia   . Peripheral vascular disease (HCC)   . Personal history of malignant neoplasm of cervix uteri   . Personal history of other lymphatic and  hematopoietic neoplasm 2002   hodgkins lymphoma    Patient Active Problem List   Diagnosis Date Noted  . Acute systolic heart failure (HCC)   . PAF (paroxysmal atrial fibrillation) (HCC)   . AAA (abdominal aortic aneurysm) (HCC) 02/09/2017  . Tobacco abuse 01/28/2012  . CARDIOMYOPATHY, ISCHEMIC 12/22/2009  . CAD in native artery 01/07/2009  . HYPERLIPIDEMIA 01/01/2009  . HYPERTENSION 01/01/2009  . CERVICAL CANCER, HX OF 01/01/2009  . NON-HODGKIN'S LYMPHOMA, HX OF 01/01/2009  . ATRIAL SEPTAL DEFECT, HX OF 01/01/2009    Past Surgical History:  Procedure Laterality Date  . ABDOMINAL AORTOGRAM N/A 01/11/2017   Procedure: Abdominal Aortogram;  Surgeon: Nada LibmanBrabham, Vance W, MD;  [facility]: [facility] INV[facility] CV LAB;  Service: Cardiovascular;  Laterality: N/A;  . ANGIOPLASTY Left 02/09/2017   Procedure: LEFT PROFUNDA  FEMORAL ARTERY ANGIOPLASTY USING XENOSURE BIOLOGIC PATCH;  Surgeon: Nada LibmanBrabham, Vance W, MD;  Location: [facility] OR;  Service: Vascular;  Laterality: Left;  . AORTA - BILATERAL FEMORAL ARTERY BYPASS GRAFT N/A 02/09/2017   Procedure: AORTA BIFEMORAL BYPASS GRAFT USING HEMASHIELD GOLD;  Surgeon: Nada LibmanBrabham, Vance W, MD;  Location: [facility] OR;  Service: Vascular;  Laterality: N/A;  . ASD AND VSD REPAIR    . LOWER EXTREMITY ANGIOGRAPHY Bilateral 01/11/2017   Procedure: Lower Extremity Angiography;  Surgeon: Nada LibmanBrabham, Vance W, MD;  Location: [facility] INVASIVE CV LAB;  Service: Cardiovascular;  Laterality: Bilateral;  . RIGHT HEART CATH N/A 02/18/2017   Procedure: RIGHT HEART CATH;  Surgeon: Dolores Patty, MD;  Location: [facility] INVASIVE CV LAB;  Service: Cardiovascular;  Laterality: N/A;  . VEIN SURGERY Bilateral Jan. Feb. and Mar. 2018   Dr. Earna Coder (heart and vein sova [place]    OB History    No data available       Home Medications    Prior to Admission medications   Medication Sig Start Date End Date Taking? Authorizing Provider  aspirin EC 81 MG tablet Take 81 mg by mouth daily.    [provider]  carvedilol (COREG) 12.5 MG tablet Take 12.5 mg by mouth 2 (two) times daily with a meal.    [provider]  Cranberry 1000 MG CAPS Take 1 capsule by mouth daily.     [provider]  furosemide (LASIX) 40 MG tablet Take 1 tablet (40 mg total) by mouth daily. 05/20/17 08/18/17  Antoine Poche, MD  lisinopril (PRINIVIL,ZESTRIL) 10 MG tablet Take 10 mg by mouth daily.    [provider]  metFORMIN (GLUCOPHAGE) 500 MG tablet Take 500 mg by mouth daily with breakfast.     [provider]  nitroGLYCERIN (NITROSTAT) 0.4 MG SL tablet Place 1 tablet (0.4 mg total) under the tongue every 5 (five) minutes as needed for chest pain (for chest pain). 10/17/15   Kathleene Hazel, MD  pregabalin (LYRICA) 75 MG capsule Take 75 mg by mouth daily.     [provider]  simvastatin (ZOCOR) 20 MG tablet Take 1 tablet (20 mg total) by mouth daily. 01/26/17   Antoine Poche, MD  traMADol (ULTRAM) 50 MG tablet Take 1 tablet (50 mg total) by mouth every 6 (six) hours as needed. 04/04/17   Nada Libman, MD    Family History Family History  Problem Relation Age of Onset  . Leukemia Father   . Heart Problems Mother   . Heart attack Sister     Social History Social History   Tobacco Use  . Smoking status: Current Some Day Smoker    Packs/day: 0.25    Years: 50.00    Pack years: 12.50    Types: Cigarettes, E-cigarettes  . Smokeless tobacco: Never Used  . Tobacco comment: 1 ppd  Substance Use Topics  . Alcohol use: No  . Drug use: No     Allergies   Codeine   Review of Systems Review of Systems  Constitutional: Positive for fatigue. Negative for fever.  Respiratory: Positive for shortness of breath. Negative for cough.   Cardiovascular: Positive for leg swelling. Negative for chest pain.  Gastrointestinal: Negative for abdominal pain, diarrhea, nausea and vomiting.  All other systems reviewed and are negative.    Physical  Exam Updated Vital Signs BP 104/87   Pulse 76   Temp 97.7 F (36.5 C) (Oral)   Resp 15   Ht 4\' 11"  (1.499 m)   Wt 68 kg (150 lb)   SpO2 99%   BMI 30.30 kg/m   Physical Exam Physical Exam  Nursing note and vitals reviewed. Constitutional: Well developed, well nourished, non-toxic, and in no acute distress Head: Normocephalic and atraumatic.  Mouth/Throat: Oropharynx is clear and moist.  Neck: Normal range of motion. Neck supple.  Cardiovascular: Normal rate and regular rhythm.   Pulmonary/Chest: Effort normal and breath sounds normal.  Abdominal: Soft. There is no tenderness. There is no rebound and no guarding.  Musculoskeletal: Normal range of motion. +2 symmetric lower extremity edema Neurological: Alert, no facial droop, fluent speech,  moves all extremities symmetrically Skin: Skin is warm and dry.  Psychiatric: Cooperative   ED Treatments / Results  Labs (all labs ordered are listed, but only abnormal results are displayed) Labs Reviewed  BASIC METABOLIC PANEL - Abnormal; Notable for the following components:      Result Value   Chloride 95 (*)    Glucose, Bld 145 (*)    BUN 55 (*)    Creatinine, Ser 1.69 (*)    GFR calc non Af Amer 29 (*)    GFR calc Af Amer 34 (*)    All other components within normal limits  CBC - Abnormal; Notable for the following components:   WBC 10.7 (*)    All other components within normal limits  TROPONIN I - Abnormal; Notable for the following components:   Troponin I 0.06 (*)    All other components within normal limits  HEPATIC FUNCTION PANEL - Abnormal; Notable for the following components:   Albumin 3.2 (*)    AST 304 (*)    ALT 431 (*)    Alkaline Phosphatase 177 (*)    All other components within normal limits  BRAIN NATRIURETIC PEPTIDE - Abnormal; Notable for the following components:   B Natriuretic Peptide 2,426.0 (*)    All other components within normal limits    EKG  EKG Interpretation  Date/Time:  Monday  May 30 2017 16:32:57 EST Ventricular Rate:  85 PR Interval:  236 QRS Duration: 148 QT Interval:  432 QTC Calculation: 514 R Axis:   146 Text Interpretation:  Sinus rhythm with sinus arrhythmia with 1st degree A-V block Right axis deviation Non-specific intra-ventricular conduction block Abnormal ECG Confirmed by Crista Curb [phone]) on 05/30/2017 5:24:08 PM       Radiology Dg Chest 2 View  Result Date: 05/30/2017 CLINICAL DATA:  Shortness of breath EXAM: CHEST  2 VIEW COMPARISON:  02/14/2017 FINDINGS: Linear atelectasis or scarring at the bases. No acute consolidation or effusion. Stable cardiomegaly with aortic atherosclerosis. No pneumothorax. IMPRESSION: 1. Linear scarring or atelectasis at both lung bases. No focal pulmonary opacity is seen 2. Stable mild cardiomegaly. Electronically Signed   By: Jasmine Pang M.D.   On: 05/30/2017 17:42    Procedures Procedures (including critical care time)  Medications Ordered in ED Medications  furosemide (LASIX) injection 40 mg (40 mg Intravenous Given 05/30/17 1758)     Initial Impression / Assessment and Plan / ED Course  I have reviewed the triage vital signs and the nursing notes.  Pertinent labs & imaging results that were available during my care of the patient were reviewed by me and considered in my medical decision making (see chart for details).     71 year old female who presents with persistent shortness of breath and lower extremity edema after recent admission for treatment of CHF exacerbation in [place].  She is nontoxic in no acute distress.  No hypoxia while at rest on room air.  Does clinically look fluid overloaded with lower extremity edema.  Lungs are clear and chest x-ray shows no significant interstitial pulmonary edema.  However she does have a significantly elevated BNP of 2400.  She has a mild troponin elevation of 0.06, likely more related to demand in the setting of her CHF.  She has no acute EKG changes  and no chest pain.  Blood work also notable for elevated LFTs suggestive of hepatic congestion.  She does have AKI as well with creatinine of 1.69, likely related to recent diuresis in the  hospital.  Given that she still has signs of persistent CHF exacerbation with kidney injury, I think she is best served if she is readmitted into the hospital for ongoing diuresis. Spoke with Dr. Sharl MaLama who will admit.  Final Clinical Impressions(s) / ED Diagnoses   Final diagnoses:  Acute on chronic systolic congestive heart failure [facility](HCC)    ED Discharge Orders    None       Lavera GuiseLiu, Dana Duo, MD 05/30/17 1925

## 2017-05-30 NOTE — Telephone Encounter (Signed)
Pt c/o SOB and swelling in legs/stomach - and general weakness - has not been monitoring weights but weight to day is 150 lbs (has been taking lasix 40 mg bid over the weekend) - also says not sleeping more than a few hours at a time - denies any chest pain - Explained that we could work her in on 12/28 however with pt symptoms still not better and swelling is worse, may be best to be evaluated at [facility] ED - pt agreed and will call family member to take her this afternoon if possible. Will forward to provider for any further suggestions.

## 2017-05-30 NOTE — Telephone Encounter (Signed)
I agree with ER evaluation   J Branch MD 

## 2017-05-30 NOTE — H&P (Addendum)
TR[facility] H&P    Patient Demographics:    Charlotte Salas, is a 71 y.o. female  MRN: [id number]  DOB - 09/06/1945  Admit Date - 05/30/2017  Referring MD/NP/PA: Rayna Sexton  Outpatient Primary MD for the patient is Stoney Bang, FNP  Patient coming from: home Chief Complaint  Patient presents with  . Shortness of Breath      HPI:    Charlotte Salas  is a 71 y.o. female, with history of coronary artery disease status post content, chronic systolic heart failure with EF 30 to 35%, hypertension, diabetes mellitus, paroxysmal atrial fibrillation, not on anti-globalization as it was felt that she only had postoperative atrial fibrillation, peripheral arterial disease status post aorta by femoral bypass graft who came to hospital with complaints of shortness of breath. Orthopnea. Patient was recently discharged from the hospital at [facility] where she was diarist with IV Lasix. Patient says that she lost 20 pounds weight at [place]. She complains of worsening leg swelling in both lower extremities.  She denies chest pain no nausea vomiting or diarrhea. Denies fever or chills. No abdominal pain no previous history of stroke or seizures does have a history of cervical cancer status post radiation and chemotherapy in 1972 non-Hodgkin lymphoma status post chemotherapy, in remission  in the ED, lab showed BNP 2426, troponin .06, AST 304, ALT 431, alkaline phosphatase 177    Review of systems:     All other systems reviewed and are negative.   With Past History of the following :    Past Medical History:  Diagnosis Date  . Atrial septal defect    hx of it  . CAD (coronary artery disease)    s/p prior stenting of the right coronary artery in '01 and non-ST-elevation myocardial infarction in jan '09, with a new bare-metal stent placed in the ostium of the left anterior descending coronary artert and the  coronary anatomy as described above with total occlusion of the previously placed right coronary artery stent. EF 35-40%  . CHF (congestive heart failure) (HCC)   . Diabetes mellitus without complication (HCC)   . Hepatitis 1971   " A"  . History of cervical cancer   . History of non-Hodgkin's lymphoma   . HTN (hypertension)   . HTN (hypertension)   . Hyperlipidemia   . Ischemic cardiomyopathy    EF 35-40% by 01/2016 echo  . Myocardial infarction (HCC) 2009, 2000  . Other and unspecified hyperlipidemia   . Peripheral vascular disease (HCC)   . Personal history of malignant neoplasm of cervix uteri   . Personal history of other lymphatic and hematopoietic neoplasm 2002   hodgkins lymphoma      Past Surgical History:  Procedure Laterality Date  . ABDOMINAL AORTOGRAM N/A 01/11/2017   Procedure: Abdominal Aortogram;  Surgeon: Nada Libman, MD;  Location: [facility] INVASIVE CV LAB;  Service: Cardiovascular;  Laterality: N/A;  . ANGIOPLASTY Left 02/09/2017   Procedure: LEFT PROFUNDA  FEMORAL ARTERY ANGIOPLASTY USING XENOSURE BIOLOGIC PATCH;  Surgeon: Nada Libman, MD;  Location: [facility] OR;  Service: Vascular;  Laterality: Left;  . AORTA - BILATERAL FEMORAL ARTERY BYPASS GRAFT N/A 02/09/2017   Procedure: AORTA BIFEMORAL BYPASS GRAFT USING HEMASHIELD GOLD;  Surgeon: Nada LibmanBrabham, Vance W, MD;  Location: [facility] OR;  Service: Vascular;  Laterality: N/A;  . ASD AND VSD REPAIR    . LOWER EXTREMITY ANGIOGRAPHY Bilateral 01/11/2017   Procedure: Lower Extremity Angiography;  Surgeon: Nada LibmanBrabham, Vance W, MD;  Location: [facility] INVASIVE CV LAB;  Service: Cardiovascular;  Laterality: Bilateral;  . RIGHT HEART CATH N/A 02/18/2017   Procedure: RIGHT HEART CATH;  Surgeon: Dolores PattyBensimhon, Daniel R, MD;  [facility];  Service: Cardiovascular;  Laterality: N/A;  . VEIN SURGERY Bilateral Jan. Feb. and Mar. 2018   Dr. Earna Coderzachary [place]heart [place]      Social History:      Social History    Tobacco Use  . Smoking status: Current Some Day Smoker    Packs/day: 0.25    Years: 50.00    Pack years: 12.50    Types: Cigarettes, E-cigarettes  . Smokeless tobacco: Never Used  . Tobacco comment: 1 ppd  Substance Use Topics  . Alcohol use: No       Family History :     Family History  Problem Relation Age of Onset  . Leukemia Father   . Heart Problems Mother   . Heart attack Sister       Home Medications:   Prior to Admission medications   Medication Sig Start Date End Date Taking? Authorizing Provider  aspirin EC 81 MG tablet Take 81 mg by mouth every evening.    Yes [provider]  carvedilol (COREG) 12.5 MG tablet Take 12.5 mg by mouth 2 (two) times daily with a meal.   Yes [provider]  Cranberry 1000 MG CAPS Take 1 capsule by mouth daily.    Yes [provider]  furosemide (LASIX) 40 MG tablet Take 1 tablet (40 mg total) by mouth daily. 05/20/17 08/18/17 Yes Branch, Dorothe PeaJonathan F, MD  lisinopril (PRINIVIL,ZESTRIL) 10 MG tablet Take 10 mg by mouth daily.   Yes [provider]  metFORMIN (GLUCOPHAGE) 500 MG tablet Take 500 mg by mouth daily with breakfast.    Yes [provider]  nitroGLYCERIN (NITROSTAT) 0.4 MG SL tablet Place 1 tablet (0.4 mg total) under the tongue every 5 (five) minutes as needed for chest pain (for chest pain). 10/17/15  Yes Kathleene HazelMcAlhany, Christopher D, MD  pregabalin (LYRICA) 75 MG capsule Take 75 mg by mouth at bedtime.    Yes [provider]  simvastatin (ZOCOR) 20 MG tablet Take 1 tablet (20 mg total) by mouth daily. 01/26/17  Yes Branch, Dorothe PeaJonathan F, MD  traMADol (ULTRAM) 50 MG tablet Take 1 tablet (50 mg total) by mouth every 6 (six) hours as needed. 04/04/17  Yes Nada LibmanBrabham, Vance W, MD     Allergies:     Allergies  Allergen Reactions  . Codeine Nausea Only     Physical Exam:   Vitals  Blood pressure 104/87, pulse 76, temperature 97.7 F (36.5 C), temperature source Oral, resp. rate  15, height 4\' 11"  (1.499 m), weight 68 kg (150 lb), SpO2 99 %.  1.  General: appears in no acute distress  2. Psychiatric:  Intact judgement and  insight, awake alert, oriented x 3.  3. Neurologic: No focal neurological deficits, all cranial nerves intact.Strength 5/5 all 4 extremities, sensation intact all 4 extremities, plantars down going.  4. Eyes :  anicteric sclerae, moist conjunctivae with no lid lag. PERRLA.  5. ENMT:  Oropharynx clear with moist mucous membranes and good dentition  6. Neck:  supple, no cervical lymphadenopathy appriciated, No thyromegaly  7. Respiratory : Normal respiratory effort, good air movement bilaterally, faint bibasilar crackles  8. Cardiovascular : RRR, no gallops, rubs or murmurs, bilateral 2+ pitting edema of the lower extremities  9. Gastrointestinal:  Positive bowel sounds, abdomen soft, non-tender to palpation,no hepatosplenomegaly, no rigidity or guarding       10. Skin:  No cyanosis, normal texture and turgor, no rash, lesions or ulcers  11.Musculoskeletal:  Good muscle tone,  joints appear normal , no effusions,  normal range of motion    Data Review:    CBC Recent Labs  Lab 05/30/17 1744  WBC 10.7*  HGB 12.7  HCT 40.4  PLT 192  MCV 85.2  MCH 26.8  MCHC 31.4  RDW 14.8   ------------------------------------------------------------------------------------------------------------------  Chemistries  Recent Labs  Lab 05/30/17 1744  NA 137  K 4.7  CL 95*  CO2 27  GLUCOSE 145*  BUN 55*  CREATININE 1.69*  CALCIUM 9.1  AST 304*  ALT 431*  ALKPHOS 177*  BILITOT 1.1   ------------------------------------------------------------------------------------------------------------------  ------------------------------------------------------------------------------------------------------------------ GFR: Estimated Creatinine Clearance: 24.9 mL/min (A) (by C-G formula based on SCr of 1.69 mg/dL (H)). Liver Function  Tests: Recent Labs  Lab 05/30/17 1744  AST 304*  ALT 431*  ALKPHOS 177*  BILITOT 1.1  PROT 6.5  ALBUMIN 3.2*   No results for input(s): LIPASE, AMYLASE in the last 168 hours. No results for input(s): AMMONIA in the last 168 hours. Coagulation Profile: No results for input(s): INR, PROTIME in the last 168 hours. Cardiac Enzymes: Recent Labs  Lab 05/30/17 1744  TROPONINI 0.06*    --------------------------------------------------------------------------------------------------------------- Urine analysis:    Component Value Date/Time   COLORURINE YELLOW 02/07/2017 1153   APPEARANCEUR CLEAR 02/07/2017 1153   LABSPEC 1.008 02/07/2017 1153   PHURINE 5.0 02/07/2017 1153   GLUCOSEU NEGATIVE 02/07/2017 1153   HGBUR NEGATIVE 02/07/2017 1153   BILIRUBINUR NEGATIVE 02/07/2017 1153   KETONESUR NEGATIVE 02/07/2017 1153   PROTEINUR NEGATIVE 02/07/2017 1153   NITRITE NEGATIVE 02/07/2017 1153   LEUKOCYTESUR NEGATIVE 02/07/2017 1153      Imaging Results:    Dg Chest 2 View  Result Date: 05/30/2017 CLINICAL DATA:  Shortness of breath EXAM: CHEST  2 VIEW COMPARISON:  02/14/2017 FINDINGS: Linear atelectasis or scarring at the bases. No acute consolidation or effusion. Stable cardiomegaly with aortic atherosclerosis. No pneumothorax. IMPRESSION: 1. Linear scarring or atelectasis at both lung bases. No focal pulmonary opacity is seen 2. Stable mild cardiomegaly. Electronically Signed   By: Jasmine PangKim  Fujinaga M.D.   On: 05/30/2017 17:42    My personal review of EKG: Rhythm NSR   Assessment & Plan:    Active Problems:   CHF exacerbation (HCC)   1. CHF exacerbation-acute on chronic systolic CHF, patient was given Lasix 40 mg IV in the ED. Blood pressure has been soft. We'll start Lasix 20 mg IV Q 12 hour from tomorrow morning. Consult cardiology in a.m. intake and output. Daily weights.  2. Diabetes mellitus- start sliding scale insulin with NovoLog. Hold metformin 3. Elevated  troponin-patient has mild elevation of troponin .06,likely from demand ischemia due to CHF exacerbation. Will cycle troponin q six hours time three. Patient has no chest pain. 4. Paroxysmal atrial fibrillation- patient had postop atrial fibrillation, she was not put on anticoagulation. Will monitor. 5. Acute kidney  injury- and is 1.16, patient's baseline creatinine is around one. Likely from diuresis with Lasix. Closely monitor BMP in am. 6. Transamnitis-patient has elevated liver enzymes AST, ALT, alkaline phosphatase likely from passive liver congestion due to CHF versus hypotension, as patient has been dizzy at home and blood pressure resolved in the ED. follow liver enzymes in a.m. If persistently elevated consider abdominal ultrasound. 7. Hypertension-continue Coreg 8. Hyperlipidemia-continued Zocor     DVT Prophylaxis-   Lovenox   AM Labs Ordered, also please review Full Orders  Family Communication: Admission, patients condition and plan of care including tests being ordered have been discussed with the patient who indicate understanding and agree with the plan and Code Status.  Code Status: DNR  Admission status: observation    Time spent in minutes : 60 minutes   Meredeth Ide M.D on 05/30/2017 at 7:59 PM  Between 7am to 7pm - Pager - [phone]. After 7pm go to [email or web address] - password [facility]  [facility] - Office  [phone]

## 2017-05-31 ENCOUNTER — Observation Stay (HOSPITAL_COMMUNITY): Payer: Medicare Other

## 2017-05-31 ENCOUNTER — Inpatient Hospital Stay (HOSPITAL_COMMUNITY): Payer: Medicare Other

## 2017-05-31 DIAGNOSIS — I5043 Acute on chronic combined systolic (congestive) and diastolic (congestive) heart failure: Secondary | ICD-10-CM

## 2017-05-31 DIAGNOSIS — N189 Chronic kidney disease, unspecified: Secondary | ICD-10-CM | POA: Diagnosis not present

## 2017-05-31 DIAGNOSIS — K761 Chronic passive congestion of liver: Secondary | ICD-10-CM | POA: Diagnosis present

## 2017-05-31 DIAGNOSIS — I5082 Biventricular heart failure: Secondary | ICD-10-CM | POA: Diagnosis present

## 2017-05-31 DIAGNOSIS — R740 Nonspecific elevation of levels of transaminase and lactic acid dehydrogenase [LDH]: Secondary | ICD-10-CM

## 2017-05-31 DIAGNOSIS — I714 Abdominal aortic aneurysm, without rupture: Secondary | ICD-10-CM

## 2017-05-31 DIAGNOSIS — I361 Nonrheumatic tricuspid (valve) insufficiency: Secondary | ICD-10-CM | POA: Diagnosis not present

## 2017-05-31 DIAGNOSIS — R0602 Shortness of breath: Secondary | ICD-10-CM | POA: Diagnosis present

## 2017-05-31 DIAGNOSIS — Z720 Tobacco use: Secondary | ICD-10-CM | POA: Diagnosis not present

## 2017-05-31 DIAGNOSIS — F17290 Nicotine dependence, other tobacco product, uncomplicated: Secondary | ICD-10-CM | POA: Diagnosis present

## 2017-05-31 DIAGNOSIS — Z8541 Personal history of malignant neoplasm of cervix uteri: Secondary | ICD-10-CM | POA: Diagnosis not present

## 2017-05-31 DIAGNOSIS — I509 Heart failure, unspecified: Secondary | ICD-10-CM

## 2017-05-31 DIAGNOSIS — N183 Chronic kidney disease, stage 3 (moderate): Secondary | ICD-10-CM

## 2017-05-31 DIAGNOSIS — Z923 Personal history of irradiation: Secondary | ICD-10-CM | POA: Diagnosis not present

## 2017-05-31 DIAGNOSIS — Z9221 Personal history of antineoplastic chemotherapy: Secondary | ICD-10-CM | POA: Diagnosis not present

## 2017-05-31 DIAGNOSIS — I2510 Atherosclerotic heart disease of native coronary artery without angina pectoris: Secondary | ICD-10-CM

## 2017-05-31 DIAGNOSIS — E1151 Type 2 diabetes mellitus with diabetic peripheral angiopathy without gangrene: Secondary | ICD-10-CM | POA: Diagnosis present

## 2017-05-31 DIAGNOSIS — N289 Disorder of kidney and ureter, unspecified: Secondary | ICD-10-CM | POA: Diagnosis not present

## 2017-05-31 DIAGNOSIS — K8020 Calculus of gallbladder without cholecystitis without obstruction: Secondary | ICD-10-CM | POA: Diagnosis present

## 2017-05-31 DIAGNOSIS — I25110 Atherosclerotic heart disease of native coronary artery with unstable angina pectoris: Secondary | ICD-10-CM | POA: Diagnosis not present

## 2017-05-31 DIAGNOSIS — I10 Essential (primary) hypertension: Secondary | ICD-10-CM | POA: Diagnosis not present

## 2017-05-31 DIAGNOSIS — J9811 Atelectasis: Secondary | ICD-10-CM | POA: Diagnosis present

## 2017-05-31 DIAGNOSIS — J449 Chronic obstructive pulmonary disease, unspecified: Secondary | ICD-10-CM | POA: Diagnosis present

## 2017-05-31 DIAGNOSIS — Z955 Presence of coronary angioplasty implant and graft: Secondary | ICD-10-CM | POA: Diagnosis not present

## 2017-05-31 DIAGNOSIS — Z8572 Personal history of non-Hodgkin lymphomas: Secondary | ICD-10-CM | POA: Diagnosis not present

## 2017-05-31 DIAGNOSIS — I480 Paroxysmal atrial fibrillation: Secondary | ICD-10-CM | POA: Diagnosis present

## 2017-05-31 DIAGNOSIS — E785 Hyperlipidemia, unspecified: Secondary | ICD-10-CM | POA: Diagnosis present

## 2017-05-31 DIAGNOSIS — I4892 Unspecified atrial flutter: Secondary | ICD-10-CM | POA: Diagnosis present

## 2017-05-31 DIAGNOSIS — I130 Hypertensive heart and chronic kidney disease with heart failure and stage 1 through stage 4 chronic kidney disease, or unspecified chronic kidney disease: Secondary | ICD-10-CM | POA: Diagnosis present

## 2017-05-31 DIAGNOSIS — N170 Acute kidney failure with tubular necrosis: Secondary | ICD-10-CM | POA: Diagnosis not present

## 2017-05-31 DIAGNOSIS — R7401 Elevation of levels of liver transaminase levels: Secondary | ICD-10-CM

## 2017-05-31 DIAGNOSIS — I5023 Acute on chronic systolic (congestive) heart failure: Secondary | ICD-10-CM | POA: Diagnosis not present

## 2017-05-31 DIAGNOSIS — E1122 Type 2 diabetes mellitus with diabetic chronic kidney disease: Secondary | ICD-10-CM | POA: Diagnosis present

## 2017-05-31 DIAGNOSIS — I472 Ventricular tachycardia: Secondary | ICD-10-CM | POA: Diagnosis present

## 2017-05-31 DIAGNOSIS — N179 Acute kidney failure, unspecified: Secondary | ICD-10-CM

## 2017-05-31 DIAGNOSIS — K7200 Acute and subacute hepatic failure without coma: Secondary | ICD-10-CM | POA: Diagnosis present

## 2017-05-31 LAB — ECHOCARDIOGRAM COMPLETE
AO mean calculated velocity dopler: 69.8 cm/s
AOVTI: 20.8 cm
AV Area VTI index: 0.61 cm2/m2
AV Area VTI: 1.45 cm2
AV Area mean vel: 1.39 cm2
AV VEL mean LVOT/AV: 0.61
AV area mean vel ind: 0.8 cm2/m2
AV pk vel: 120 cm/s
AVA: 1.06 cm2
AVG: 3 mmHg
AVPG: 6 mmHg
Ao pk vel: 0.64 m/s
CHL CUP AV PEAK INDEX: 0.84
CHL CUP AV VALUE AREA INDEX: 0.61
CHL CUP AV VEL: 1.06
CHL CUP DOP CALC LVOT VTI: 9.71 cm
CHL CUP MV DEC (S): 102
CHL CUP TV REG PEAK VELOCITY: 328 cm/s
E decel time: 102 msec
FS: 4 % — AB (ref 28–44)
HEIGHTINCHES: 60 in
IVS/LV PW RATIO, ED: 1.09
LA diam end sys: 47 mm
LA vol: 91.1 mL
LADIAMINDEX: 2.71 cm/m2
LASIZE: 47 mm
LAVOLA4C: 103 mL
LAVOLIN: 52.5 mL/m2
LV PW d: 10 mm — AB (ref 0.6–1.1)
LV dias vol index: 83 mL/m2
LV dias vol: 144 mL — AB (ref 46–106)
LVOT SV: 22 mL
LVOT area: 2.27 cm2
LVOT diameter: 17 mm
LVOT peak VTI: 0.47 cm
LVOT peak grad rest: 2 mmHg
LVOT peak vel: 76.9 cm/s
LVSYSVOL: 95 mL — AB
LVSYSVOLIN: 54 mL/m2
MV pk A vel: 38.3 m/s
MV pk E vel: 122 m/s
MVPG: 6 mmHg
PISA EROA: 0.17 cm2
RV TAPSE: 10.9 mm
Simpson's disk: 34
Stroke v: 50 ml
TRMAXVEL: 328 cm/s
VTI: 140 cm
WEIGHTICAEL: 2433.88 [oz_av]

## 2017-05-31 LAB — GLUCOSE, CAPILLARY
GLUCOSE-CAPILLARY: 237 mg/dL — AB (ref 65–99)
GLUCOSE-CAPILLARY: 189 mg/dL — AB (ref 65–99)
Glucose-Capillary: 191 mg/dL — ABNORMAL HIGH (ref 65–99)
Glucose-Capillary: 150 mg/dL — ABNORMAL HIGH (ref 65–99)

## 2017-05-31 LAB — CARBOXYHEMOGLOBIN - COOX: CARBOXYHEMOGLOBIN: 1.2 % (ref 0.5–1.5)

## 2017-05-31 LAB — COMPREHENSIVE METABOLIC PANEL
ALT: 358 U/L — AB (ref 14–54)
AST: 244 U/L — AB (ref 15–41)
Albumin: 2.9 g/dL — ABNORMAL LOW (ref 3.5–5.0)
Alkaline Phosphatase: 163 U/L — ABNORMAL HIGH (ref 38–126)
Anion gap: 13 (ref 5–15)
BUN: 56 mg/dL — AB (ref 6–20)
CHLORIDE: 93 mmol/L — AB (ref 101–111)
CO2: 28 mmol/L (ref 22–32)
CREATININE: 1.68 mg/dL — AB (ref 0.44–1.00)
Calcium: 8.6 mg/dL — ABNORMAL LOW (ref 8.9–10.3)
GFR calc Af Amer: 34 mL/min — ABNORMAL LOW (ref >60)
GFR, EST NON AFRICAN AMERICAN: 29 mL/min — AB (ref >60)
GLUCOSE: 230 mg/dL — AB (ref 65–99)
Potassium: 4.1 mmol/L (ref 3.5–5.1)
Sodium: 134 mmol/L — ABNORMAL LOW (ref 135–145)
Total Bilirubin: 1.1 mg/dL (ref 0.3–1.2)
Total Protein: 5.9 g/dL — ABNORMAL LOW (ref 6.5–8.1)

## 2017-05-31 LAB — CBC
HEMATOCRIT: 39.9 % (ref 36.0–46.0)
Hemoglobin: 12.4 g/dL (ref 12.0–15.0)
MCH: 26.7 pg (ref 26.0–34.0)
MCHC: 31.1 g/dL (ref 30.0–36.0)
MCV: 85.8 fL (ref 78.0–100.0)
PLATELETS: 153 10*3/uL (ref 150–400)
RBC: 4.65 MIL/uL (ref 3.87–5.11)
RDW: 14.9 % (ref 11.5–15.5)
WBC: 10.3 10*3/uL (ref 4.0–10.5)

## 2017-05-31 LAB — TROPONIN I
Troponin I: 0.05 ng/mL (ref <0.03)
Troponin I: 0.05 ng/mL (ref <0.03)

## 2017-05-31 MED ORDER — CARVEDILOL 3.125 MG PO TABS
3.1250 mg | ORAL_TABLET | Freq: Two times a day (BID) | ORAL | Status: DC
  Administered 2017-05-31 – 2017-06-02 (×5): 3.125 mg via ORAL
  Filled 2017-05-31 (×5): qty 1

## 2017-05-31 MED ORDER — FUROSEMIDE 10 MG/ML IJ SOLN
40.0000 mg | Freq: Two times a day (BID) | INTRAMUSCULAR | Status: DC
  Administered 2017-05-31 – 2017-06-02 (×4): 40 mg via INTRAVENOUS
  Filled 2017-05-31 (×4): qty 4

## 2017-05-31 NOTE — Progress Notes (Addendum)
PROGRESS NOTE  Riley Lammma J Whitfill XBJ:[id number] DOB: 1946-04-19 DOA: 05/30/2017 PCP: Stoney BangWinikur, Melinda, FNP  Brief History:  71 year old female with a history of coronary artery disease, systolic CHF with EF 30-35%, hypertension, diabetes mellitus, postoperative atrial fibrillation, peripheral arterial disease, hyperlipidemia presented with 2-day history of shortness of breath, orthopnea, lower extremity edema, and increasing abdominal girth.  Patient was recently [place] in [place] where [place] was admitted for approximately 3 days for CHF exacerbation.  The patient was discharged home with furosemide 40 mg po twice daily.  She states she was compliant with her medications as well as her diet and fluid restrictions.  The patient stated that she weighed herself, and her last weight on the morning of May 30, 2017 was 150 pounds.  Upon presentation, the patient was noted to have serum creatinine 1.69.  Chest x-ray showed bibasilar scarring.  BNP was 2126.  Troponin was 0.06.  The patient was started on intravenous furosemide.  Pt states she has not taken Entresto beyond September 2018 due to cost.  Assessment/Plan: Acute on chronic systolic CHF -February 12, 2017 echo EF 30-35%, diffuse HK, moderate MR/TR, PAS P 45 -Despite moderate TR contributing to JVD, the patient remains clinically fluid overloaded -Continue intravenous furosemide--> increase to 40 mg twice daily -Consult cardiology -February 19, 2017 discharge weight 146 pounds March 01, 2017 office weight-134 pounds -Daily weights -Strict I's and O's  Elevated troponin -Secondary to AK I and CHF exacerbation -No chest pain -Personally reviewed EKG--LBBB, sinus rhythm -Trend is flat  Acute on chronic renal failure--CKD3 -Baseline creatinine 0.9-1.2 -Presented creatinine 1.69 -am BMP  Transaminasemia -likely hepatic congestion from CHF -RUQ US -am LFTs  Essential hypertension -Decrease  carvedilol secondary to soft blood pressure  Tobacco abuse/COPD -Patient has over 40-pack-year history -Has not had a cigarette in over 1 week -Tobacco cessation discussed  Diabetes mellitus -Holding metformin--likely will not restarted given the patient's worsening renal function -NovoLog sliding scale for now -check A1C  Postoperative atrial fibrillation -Presently in sinus rhythm  Coronary artery disease -Continue aspirin and statin -BMS to LAD 2009 -No chest pain presently   Disposition Plan:   Home in 2-3 days  Family Communication:  No  Family at bedside  Consultants:  cardiology  Code Status:  FULL   DVT Prophylaxis:  [place] Lovenox   Procedures: As Listed in Progress Note Above  Antibiotics: None    Subjective: Patient is breathing better this morning, but she remains dyspneic with exertion and lying flat.  She denies any fevers, chills, chest discomfort, nausea, vomiting, diarrhea, abdominal pain.  No dysuria hematuria.  No hematochezia melena.  Objective: Vitals:   05/30/17 1830 05/30/17 2000 05/30/17 2107 05/31/17 0600  BP: 104/87 100/60 106/70 97/79  Pulse: 76 86 (!) 101 74  Resp: 15 (!) 22 (!) 22 20  Temp:   97.7 F (36.5 C) 97.6 F (36.4 C)  TempSrc:   Oral Oral  SpO2: 99% 93% 93% 99%  Weight:   66.9 kg (147 lb 8 oz) 69 kg (152 lb 1.9 oz)  Height:   5' (1.524 m)     Intake/Output Summary (Last 24 hours) at 05/31/2017 0726 Last data filed at 05/30/2017 2319 Gross per 24 hour  Intake -  Output 200 ml  Net -200 ml   Weight change:  Exam:   General:  Pt is alert, follows commands appropriately, not in acute distress  HEENT: No icterus, No  thrush, No neck mass, [place]/AT  Cardiovascular: RRR, S1/S2, no rubs, no gallops +JVD  Respiratory: Bibasilar crackle; clear to auscultation on the left.  No wheezing  Abdomen: Soft/+BS, non tender, non distended, no guarding  Extremities: 1+ LE edema, No lymphangitis, No petechiae, No rashes, no  synovitis   Data Reviewed: I have personally reviewed following labs and imaging studies Basic Metabolic Panel: Recent Labs  Lab 05/30/17 1744 05/31/17 0546  NA 137 134*  K 4.7 4.1  CL 95* 93*  CO2 27 28  GLUCOSE 145* 230*  BUN 55* 56*  CREATININE 1.69* 1.68*  CALCIUM 9.1 8.6*   Liver Function Tests: Recent Labs  Lab 05/30/17 1744 05/31/17 0546  AST 304* 244*  ALT 431* 358*  ALKPHOS 177* 163*  BILITOT 1.1 1.1  PROT 6.5 5.9*  ALBUMIN 3.2* 2.9*   No results for input(s): LIPASE, AMYLASE in the last 168 hours. No results for input(s): AMMONIA in the last 168 hours. Coagulation Profile: No results for input(s): INR, PROTIME in the last 168 hours. CBC: Recent Labs  Lab 05/30/17 1744 05/31/17 0546  WBC 10.7* 10.3  HGB 12.7 12.4  HCT 40.4 39.9  MCV 85.2 85.8  PLT 192 153   Cardiac Enzymes: Recent Labs  Lab 05/30/17 1744 05/31/17 0028 05/31/17 0546  TROPONINI 0.06* 0.05* 0.05*   BNP: Invalid input(s): POCBNP CBG: No results for input(s): GLUCAP in the last 168 hours. HbA1C: No results for input(s): HGBA1C in the last 72 hours. Urine analysis:    Component Value Date/Time   COLORURINE YELLOW 02/07/2017 1153   APPEARANCEUR CLEAR 02/07/2017 1153   LABSPEC 1.008 02/07/2017 1153   PHURINE 5.0 02/07/2017 1153   GLUCOSEU NEGATIVE 02/07/2017 1153   HGBUR NEGATIVE 02/07/2017 1153   BILIRUBINUR NEGATIVE 02/07/2017 1153   KETONESUR NEGATIVE 02/07/2017 1153   PROTEINUR NEGATIVE 02/07/2017 1153   NITRITE NEGATIVE 02/07/2017 1153   LEUKOCYTESUR NEGATIVE 02/07/2017 1153   Sepsis Labs: @LABRCNTIP (procalcitonin:4,lacticidven:4) )No results found for this or any previous visit (from the past 240 hour(s)).   Scheduled Meds: . aspirin EC  81 mg Oral QPM  . carvedilol  12.5 mg Oral BID WC  . enoxaparin (LOVENOX) injection  30 mg Subcutaneous Q24H  . furosemide  20 mg Intravenous Q12H  . insulin aspart  0-9 Units Subcutaneous TID WC  . pregabalin  75 mg Oral QHS   . simvastatin  20 mg Oral q1800  . sodium chloride flush  3 mL Intravenous Q12H   Continuous Infusions: . sodium chloride      Procedures/Studies: Dg Chest 2 View  Result Date: 05/30/2017 CLINICAL DATA:  Shortness of breath EXAM: CHEST  2 VIEW COMPARISON:  02/14/2017 FINDINGS: Linear atelectasis or scarring at the bases. No acute consolidation or effusion. Stable cardiomegaly with aortic atherosclerosis. No pneumothorax. IMPRESSION: 1. Linear scarring or atelectasis at both lung bases. No focal pulmonary opacity is seen 2. Stable mild cardiomegaly. Electronically Signed   By: Jasmine PangKim  Fujinaga M.D.   On: 05/30/2017 17:42    Catarina Hartshornavid Acen Craun, DO  [facility] Pager [phone]  If 7PM-7AM, please contact night-coverage [email or web address] Password [email or web address] 05/31/2017, 7:26 AM   LOS: 0 days

## 2017-05-31 NOTE — Consult Note (Signed)
Cardiology Consult    Patient ID: Charlotte Salas; [id number]; 05-Feb-1946   Admit date: 05/30/2017 Date of Consult: 05/31/2017  Primary Care Provider: Stoney BangWinikur, Melinda, FNP Primary Cardiologist: Dr. Wyline MoodBranch   Patient Profile    Charlotte Salas is a 71 y.o. female with past medical history of CAD (s/p stenting of RCA in 2001, BMS to LAD in 2009 with total occlusion of previously placed RCA stent noted with collateral flow present), ischemic cardiomyopathy (EF 30-35% by echo in 02/2017), PAD (s/p aortobifemoral bypass in 02/2017), post-operative atrial fibrillation (after surgery in 02/2017, not started on anticoagulation), HTN, HLD, and AAA who is being seen today for the evaluation of CHF at the request of Dr. Arbutus Leasat.   History of Present Illness    Charlotte Salas was last examined by Joni ReiningKathryn Lawrence, NP In 02/2017 following her recent fem-pop bypass and [facility] She was continued on Coreg and Entresto at that time.   In talking with the patient today, she reports being recently admitted [facility] from 12/10 - 05/26/2017 for worsening dyspnea on exertion and lower extremity edema. She was diuresed with IV Lasix by her report and sent home on PO Lasix 40 mg daily. While there, her symptoms did not improve and she started to retain more fluid. She has noted worsening dyspnea on exertion and orthopnea as well. Denies any recent chest pain or palpitations. Reports she has not slept well since recent hospital discharge due to her dyspnea and being anxious about her current state of health. She does not follow her weight daily reports this is usually in the 130's and was elevated to 150 lbs at the time of admission.  Initial labs showed WBC 10.7, hemoglobin 12.7, and platelets 192.  Na+ 137, K+ 4.7 and creatinine elevated to 1.69 (previously 1.00 in 02/2017). AST 244 and ALT 358. BNP is at 2426. Initial troponin 0.06 with repeat values of 0.05. CXR showing atelectasis  with no focal abnormalities.  EKG with NSR, HR 85, with 1st degree AV Block, and LBBB   She has been admitted with an acute on chronic CHF exacerbation and started on IV Lasix.  This was initially at 20 mg twice daily and has been increased to 40 mg twice daily. Minimal recorded output thus far.    Past Medical History:  Diagnosis Date  . Atrial septal defect    hx of it  . CAD (coronary artery disease)    s/p prior stenting of the right coronary artery in '01 and non-ST-elevation myocardial infarction in jan '09, with a new bare-metal stent placed in the ostium of the left anterior descending coronary artert and the coronary anatomy as described above with total occlusion of the previously placed right coronary artery stent. EF 35-40%  . CHF (congestive heart failure) (HCC)   . Diabetes mellitus without complication (HCC)   . Hepatitis 1971   " A"  . History of cervical cancer   . History of non-Hodgkin's lymphoma   . HTN (hypertension)   . HTN (hypertension)   . Hyperlipidemia   . Ischemic cardiomyopathy    EF 35-40% by 01/2016 echo  . Myocardial infarction (HCC) 2009, 2000  . Other and unspecified hyperlipidemia   . Peripheral vascular disease (HCC)   . Personal history of malignant neoplasm of cervix uteri   . Personal history of other lymphatic and hematopoietic neoplasm 2002   hodgkins lymphoma    Past Surgical History:  Procedure Laterality Date  .  ABDOMINAL AORTOGRAM N/A 01/11/2017   Procedure: Abdominal Aortogram;  Surgeon: Nada Libman, MD;  Location: [facility] INVASIVE CV LAB;  Service: Cardiovascular;  Laterality: N/A;  . ANGIOPLASTY Left 02/09/2017   Procedure: LEFT PROFUNDA  FEMORAL ARTERY ANGIOPLASTY USING XENOSURE BIOLOGIC PATCH;  Surgeon: Nada Libman, MD;  Location: [facility] OR;  Service: Vascular;  Laterality: Left;  . AORTA - BILATERAL FEMORAL ARTERY BYPASS GRAFT N/A 02/09/2017   Procedure: AORTA BIFEMORAL BYPASS GRAFT USING HEMASHIELD GOLD;  Surgeon: Nada Libman,  MD;  Location: [facility] OR;  Service: Vascular;  Laterality: N/A;  . ASD AND VSD REPAIR    . LOWER EXTREMITY ANGIOGRAPHY Bilateral 01/11/2017   Procedure: Lower Extremity Angiography;  Surgeon: Nada Libman, MD;  Location: [facility] INVASIVE CV LAB;  Service: Cardiovascular;  Laterality: Bilateral;  . RIGHT HEART CATH N/A 02/18/2017   Procedure: RIGHT HEART CATH;  Surgeon: Dolores Patty, MD;  Location: [facility] INVASIVE CV LAB;  Service: Cardiovascular;  Laterality: N/A;  . VEIN SURGERY Bilateral Jan. Feb. and Mar. 2018   Dr. Earna Coder (heart and vein sova [place]     Home Medications:  Prior to Admission medications   Medication Sig Start Date End Date Taking? Authorizing Provider  aspirin EC 81 MG tablet Take 81 mg by mouth every evening.    Yes [provider]  carvedilol (COREG) 12.5 MG tablet Take 12.5 mg by mouth 2 (two) times daily with a meal.   Yes [provider]  Cranberry 1000 MG CAPS Take 1 capsule by mouth daily.    Yes [provider]  furosemide (LASIX) 40 MG tablet Take 1 tablet (40 mg total) by mouth daily. 05/20/17 08/18/17 Yes Branch, Dorothe Pea, MD  lisinopril (PRINIVIL,ZESTRIL) 10 MG tablet Take 10 mg by mouth daily.   Yes [provider]  metFORMIN (GLUCOPHAGE) 500 MG tablet Take 500 mg by mouth daily with breakfast.    Yes [provider]  nitroGLYCERIN (NITROSTAT) 0.4 MG SL tablet Place 1 tablet (0.4 mg total) under the tongue every 5 (five) minutes as needed for chest pain (for chest pain). 10/17/15  Yes Kathleene Hazel, MD  pregabalin (LYRICA) 75 MG capsule Take 75 mg by mouth at bedtime.    Yes [provider]  simvastatin (ZOCOR) 20 MG tablet Take 1 tablet (20 mg total) by mouth daily. 01/26/17  Yes Branch, Dorothe Pea, MD  traMADol (ULTRAM) 50 MG tablet Take 1 tablet (50 mg total) by mouth every 6 (six) hours as needed. 04/04/17  Yes Nada Libman, MD    Inpatient Medications: Scheduled Meds: . aspirin EC   81 mg Oral QPM  . carvedilol  3.125 mg Oral BID WC  . enoxaparin (LOVENOX) injection  30 mg Subcutaneous Q24H  . furosemide  40 mg Intravenous Q12H  . insulin aspart  0-9 Units Subcutaneous TID WC  . pregabalin  75 mg Oral QHS  . simvastatin  20 mg Oral q1800  . sodium chloride flush  3 mL Intravenous Q12H   Continuous Infusions: . sodium chloride     PRN Meds: sodium chloride, acetaminophen **OR** acetaminophen, ondansetron **OR** ondansetron (ZOFRAN) IV, sodium chloride flush, traMADol  Allergies:    Allergies  Allergen Reactions  . Codeine Nausea Only    Social History:   Social History   Socioeconomic History  . Marital status: Divorced    Spouse name: Not on file  . Number of children: Not on file  . Years of education: Not on  file  . Highest education level: Not on file  Social Needs  . Financial resource strain: Not on file  . Food insecurity - worry: Not on file  . Food insecurity - inability: Not on file  . Transportation needs - medical: Not on file  . Transportation needs - non-medical: Not on file  Occupational History  . Not on file  Tobacco Use  . Smoking status: Current Some Day Smoker    Packs/day: 0.25    Years: 50.00    Pack years: 12.50    Types: Cigarettes, E-cigarettes  . Smokeless tobacco: Never Used  . Tobacco comment: 1 ppd  Substance and Sexual Activity  . Alcohol use: No  . Drug use: No  . Sexual activity: Not on file  Other Topics Concern  . Not on file  Social History Narrative   Lives in [place], [place] alone.   Tracer study.      Family History:    Family History  Problem Relation Age of Onset  . Leukemia Father   . Heart Problems Mother   . Heart attack Sister       Review of Systems    General:  No chills, fever, night sweats or weight changes.  Cardiovascular:  No chest pain, dyspnea on exertion, palpitations, paroxysmal nocturnal dyspnea. Positive for lower extremity edema and orthopnea.  Dermatological: No rash,  lesions/masses Respiratory: No cough, dyspnea Urologic: No hematuria, dysuria Abdominal:   No nausea, vomiting, diarrhea, bright red blood per rectum, melena, or hematemesis Neurologic:  No visual changes, wkns, changes in mental status. All other systems reviewed and are otherwise negative except as noted above.  Physical Exam/Data    Vitals:   05/30/17 1830 05/30/17 2000 05/30/17 2107 05/31/17 0600  BP: 104/87 100/60 106/70 97/79  Pulse: 76 86 (!) 101 74  Resp: 15 (!) 22 (!) 22 20  Temp:   97.7 F (36.5 C) 97.6 F (36.4 C)  TempSrc:   Oral Oral  SpO2: 99% 93% 93% 99%  Weight:   147 lb 8 oz (66.9 kg) 152 lb 1.9 oz (69 kg)  Height:   5' (1.524 m)     Intake/Output Summary (Last 24 hours) at 05/31/2017 0838 Last data filed at 05/30/2017 2319 Gross per 24 hour  Intake -  Output 200 ml  Net -200 ml   Filed Weights   05/30/17 1622 05/30/17 2107 05/31/17 0600  Weight: 150 lb (68 kg) 147 lb 8 oz (66.9 kg) 152 lb 1.9 oz (69 kg)   Body mass index is 29.71 kg/m.   General: Pleasant Caucasian female appearing in NAD Psych: Normal affect. Neuro: Alert and oriented X 3. Moves all extremities spontaneously. HEENT: Normal  Neck: Supple without bruits or JVD. Lungs:  Resp regular and unlabored, decreased breath sounds along bases bilaterally. Heart: RRR no s3, s4, or murmurs. Abdomen: Soft, non-tender, non-distended, BS + x 4.  Extremities: No clubbing or cyanosis. 1+ pitting edema bilaterally. DP/PT/Radials 2+ and equal bilaterally.   EKG:  The EKG was personally reviewed and demonstrates: NSR, HR 85, with 1st degree AV Block, and LBBB     Labs/Studies     Relevant CV Studies:  Echocardiogram: 02/2017 Study Conclusions  - Left ventricle: The cavity size was normal. Wall thickness was   increased in a pattern of mild LVH. Systolic function was   moderately to severely reduced. The estimated ejection fraction   was in the range of 30% to 35%. Diffuse hypokinesis. The  study is  not technically sufficient to allow evaluation of LV diastolic   function. - Mitral valve: Mildly thickened leaflets . There was moderate   regurgitation. - Left atrium: Moderately dilated. - Right ventricle: The cavity size was mildly dilated. Systolic   function was normal. - Right atrium: Severely dilated. - Tricuspid valve: There was moderate regurgitation. - Pulmonary arteries: PA peak pressure: 45 mm Hg (S). - Inferior vena cava: The vessel was dilated. The respirophasic   diameter changes were blunted (< 50%), consistent with elevated   central venous pressure.  Impressions:  - Compared to a prior study in 01/2016, the LVEF is lower at 30-35%   with global hypokinesis.   Laboratory Data:  Chemistry Recent Labs  Lab 05/30/17 1744 05/31/17 0546  NA 137 134*  K 4.7 4.1  CL 95* 93*  CO2 27 28  GLUCOSE 145* 230*  BUN 55* 56*  CREATININE 1.69* 1.68*  CALCIUM 9.1 8.6*  GFRNONAA 29* 29*  GFRAA 34* 34*  ANIONGAP 15 13    Recent Labs  Lab 05/30/17 1744 05/31/17 0546  PROT 6.5 5.9*  ALBUMIN 3.2* 2.9*  AST 304* 244*  ALT 431* 358*  ALKPHOS 177* 163*  BILITOT 1.1 1.1   Hematology Recent Labs  Lab 05/30/17 1744 05/31/17 0546  WBC 10.7* 10.3  RBC 4.74 4.65  HGB 12.7 12.4  HCT 40.4 39.9  MCV 85.2 85.8  MCH 26.8 26.7  MCHC 31.4 31.1  RDW 14.8 14.9  PLT 192 153   Cardiac Enzymes Recent Labs  Lab 05/30/17 1744 05/31/17 0028 05/31/17 0546  TROPONINI 0.06* 0.05* 0.05*   No results for input(s): TROPIPOC in the last 168 hours.  BNP Recent Labs  Lab 05/30/17 1744  BNP 2,426.0*     Radiology/Studies:  Dg Chest 2 View  Result Date: 05/30/2017 CLINICAL DATA:  Shortness of breath EXAM: CHEST  2 VIEW COMPARISON:  02/14/2017 FINDINGS: Linear atelectasis or scarring at the bases. No acute consolidation or effusion. Stable cardiomegaly with aortic atherosclerosis. No pneumothorax. IMPRESSION: 1. Linear scarring or atelectasis at both lung  bases. No focal pulmonary opacity is seen 2. Stable mild cardiomegaly. Electronically Signed   By: Jasmine Pang M.D.   On: 05/30/2017 17:42    Assessment & Plan    1. Acute on Chronic Combined Systolic and Diastolic CHF - the patient has a known reduced EF of 30-35% by echo in 02/2017. Recently admitted in [place] for an acute CHF exacerbation but her dyspnea, orthopnea, and lower extremity edema represented upon returning home.   - BNP elevated to 2426. Initial troponin 0.06 with repeat values of 0.05. Minimal diuresis with IV Lasix 20mg . Agree with increasing to 40mg  BID. Continue to follow I&O's along with daily weights.  - Coreg dosing reduced to 3.125mg  BID in the setting of hypotension. PTA Entresto held due to hypotension and AKI.   2. CAD/Elevated Troponin  - s/p stenting of RCA in 2001, BMS to LAD in 2009 with total occlusion of previously placed RCA stent noted with collateral flow present. - cyclic troponin values have been flat at 0.06 and 0.05. Does not represent ACS and likely secondary to demand ischemia in the setting of her CHF exacerbation.  - she denies any recent chest pain. Has baseline dyspnea on exertion.  - continue ASA, BB, and statin therapy.   3. PAD - s/p aortobifemoral bypass in 02/2017.  - followed by Vascular Surgery.   4. Post-operative atrial fibrillation - after surgery in 02/2017, no known recurrence.  -  continue to monitor on telemetry. Not on anticoagulation due to the arrhythmia occurring in the post-operative setting.   5. AKI - creatinine elevated to 1.69 (previously 1.00 in 02/2017).  - repeat BMET in AM.   6. Transaminitis - AST 244 and ALT 358. Abdominal US has been ordered by the admitting team.  - continue to follow.   For questions or updates, please contact [facility] Please consult [email or web address] for contact info under Cardiology/STEMI.  Signed, Ellsworth Lennox, PA-C 05/31/2017, 8:38 AM Pager:  [phone]   Attending note:  Patient seen and examined. Reviewed chart and available records, discussed case with Ms. Iran Ouch PA-C. Charlotte Salas presents with progressive dyspnea on exertion, lower extremity edema with increased abdominal girth, and weight gain of approximately 15 pounds concerning for acute on chronic combined heart failure. She was recently managed at [facility] although it sounds like did not have substantial diuresis at least based on her current weight, and her medications have been modified substantially. It was very difficult to determine how she was taking her medications at home based on discussion with her today.  On examination she is in no distress. Reports abdominal fullness and leg edema. No chest pain. Heart rate is in the 70s with sinus rhythm by telemetry. Systolic blood pressure has been running in the 90s. Lungs exhibit no wheezing, faint crackles at the bases. Cardiac exam reveals a largely regular rhythm with indistinct PMI in soft apical systolic murmur. Abdomen is protuberant. There is 2+ lower leg edema. Lab work reveals potassium 4.1, BUN 56, creatinine 0.68, AST 244, ALT 358, troponin I flat at 0.05 and 0.06, BNP 2426, hemoglobin 12.4, platelets 153. Chest exam reveals linear atelectasis without infiltrates. ECG shows sinus rhythm with prolonged PR interval and IVCD consistent with incomplete left bundle branch block.  Patient presents with progressive shortness of breath and fluid gain although no dramatic degree of pulmonary edema by chest x-ray. BNP is elevated and she has progressive renal insufficiency with increased LFTs. Question is whether she has low output heart failure particular in light of her recent diuresis and continued symptoms. She is currently on IV Lasix, has not had a very vigorous diuresis. Recommend follow-up echocardiogram. Would also place a PICC line and then obtain cooximetry. It may be that she would benefit from transfer to  [facility] for further management by the Heart Failure team.  Jonelle Sidle, M.D., F.A.C.C.

## 2017-05-31 NOTE — Progress Notes (Signed)
COOX received and results on chart

## 2017-05-31 NOTE — Progress Notes (Signed)
*  PRELIMINARY RESULTS* Echocardiogram 2D Echocardiogram has been performed.  Stacey DrainWhite, Nathalya Wolanski J 05/31/2017, 3:32 PM

## 2017-06-01 LAB — COMPREHENSIVE METABOLIC PANEL
ALBUMIN: 2.7 g/dL — AB (ref 3.5–5.0)
ALK PHOS: 143 U/L — AB (ref 38–126)
ALT: 298 U/L — AB (ref 14–54)
AST: 187 U/L — AB (ref 15–41)
Anion gap: 12 (ref 5–15)
BILIRUBIN TOTAL: 1 mg/dL (ref 0.3–1.2)
BUN: 55 mg/dL — AB (ref 6–20)
CO2: 30 mmol/L (ref 22–32)
Calcium: 8.6 mg/dL — ABNORMAL LOW (ref 8.9–10.3)
Chloride: 95 mmol/L — ABNORMAL LOW (ref 101–111)
Creatinine, Ser: 1.67 mg/dL — ABNORMAL HIGH (ref 0.44–1.00)
GFR calc Af Amer: 34 mL/min — ABNORMAL LOW (ref >60)
GFR calc non Af Amer: 29 mL/min — ABNORMAL LOW (ref >60)
GLUCOSE: 148 mg/dL — AB (ref 65–99)
Potassium: 3.6 mmol/L (ref 3.5–5.1)
SODIUM: 137 mmol/L (ref 135–145)
Total Protein: 5.6 g/dL — ABNORMAL LOW (ref 6.5–8.1)

## 2017-06-01 LAB — GLUCOSE, CAPILLARY
GLUCOSE-CAPILLARY: 181 mg/dL — AB (ref 65–99)
GLUCOSE-CAPILLARY: 199 mg/dL — AB (ref 65–99)
Glucose-Capillary: 124 mg/dL — ABNORMAL HIGH (ref 65–99)
Glucose-Capillary: 216 mg/dL — ABNORMAL HIGH (ref 65–99)

## 2017-06-01 LAB — COOXEMETRY PANEL
CARBOXYHEMOGLOBIN: 0.9 % (ref 0.5–1.5)
Methemoglobin: 0.7 % (ref 0.0–1.5)
O2 SAT: 54.1 %
TOTAL HEMOGLOBIN: 12.9 g/dL (ref 12.0–16.0)
Total oxygen content: 9.7 mL/dL — ABNORMAL LOW (ref 15.0–23.0)

## 2017-06-01 LAB — HEMOGLOBIN A1C
Hgb A1c MFr Bld: 7.8 % — ABNORMAL HIGH (ref 4.8–5.6)
Hgb A1c MFr Bld: 7.9 % — ABNORMAL HIGH (ref 4.8–5.6)
MEAN PLASMA GLUCOSE: 177 mg/dL
Mean Plasma Glucose: 180.03 mg/dL

## 2017-06-01 LAB — MAGNESIUM: MAGNESIUM: 2.2 mg/dL (ref 1.7–2.4)

## 2017-06-01 MED ORDER — POTASSIUM CHLORIDE CRYS ER 20 MEQ PO TBCR
40.0000 meq | EXTENDED_RELEASE_TABLET | Freq: Once | ORAL | Status: AC
  Administered 2017-06-01: 40 meq via ORAL
  Filled 2017-06-01: qty 2

## 2017-06-01 MED ORDER — INSULIN ASPART 100 UNIT/ML ~~LOC~~ SOLN
0.0000 [IU] | Freq: Three times a day (TID) | SUBCUTANEOUS | Status: DC
  Administered 2017-06-01 – 2017-06-02 (×2): 3 [IU] via SUBCUTANEOUS
  Administered 2017-06-02: 8 [IU] via SUBCUTANEOUS
  Administered 2017-06-03: 5 [IU] via SUBCUTANEOUS
  Administered 2017-06-03: 2 [IU] via SUBCUTANEOUS
  Administered 2017-06-04: 8 [IU] via SUBCUTANEOUS
  Administered 2017-06-04: 3 [IU] via SUBCUTANEOUS
  Administered 2017-06-05: 2 [IU] via SUBCUTANEOUS
  Administered 2017-06-05: 5 [IU] via SUBCUTANEOUS
  Administered 2017-06-06: 3 [IU] via SUBCUTANEOUS
  Administered 2017-06-07: 2 [IU] via SUBCUTANEOUS
  Administered 2017-06-07 – 2017-06-08 (×2): 5 [IU] via SUBCUTANEOUS
  Administered 2017-06-09: 2 [IU] via SUBCUTANEOUS
  Administered 2017-06-10: 1 [IU] via SUBCUTANEOUS

## 2017-06-01 NOTE — Progress Notes (Signed)
Progress Note  Patient Name: Charlotte Salas Date of Encounter: 06/01/2017  Primary Cardiologist: Dr. Wyline MoodBranch  Subjective   Patient reports some improvement in her breathing. Had episodes of AMS overnight but now A&Ox3. Denies any chest pain or palpitations.  Inpatient Medications    Scheduled Meds: . aspirin EC  81 mg Oral QPM  . carvedilol  3.125 mg Oral BID WC  . enoxaparin (LOVENOX) injection  30 mg Subcutaneous Q24H  . furosemide  40 mg Intravenous Q12H  . insulin aspart  0-9 Units Subcutaneous TID WC  . pregabalin  75 mg Oral QHS  . simvastatin  20 mg Oral q1800  . sodium chloride flush  3 mL Intravenous Q12H   Continuous Infusions: . sodium chloride     PRN Meds: sodium chloride, acetaminophen **OR** acetaminophen, ondansetron **OR** ondansetron (ZOFRAN) IV, sodium chloride flush, traMADol   Vital Signs    Vitals:   05/31/17 2001 05/31/17 2153 06/01/17 0512 06/01/17 0600  BP:  113/62 (!) 83/45 96/68  Pulse:  79 67 75  Resp:  16 16   Temp:  98.7 F (37.1 C) 98.1 F (36.7 C)   TempSrc:  Oral Oral   SpO2: 98% 100% 100%   Weight:    150 lb 5.7 oz (68.2 kg)  Height:        Intake/Output Summary (Last 24 hours) at 06/01/2017 0732 Last data filed at 05/31/2017 1800 Gross per 24 hour  Intake 720 ml  Output -  Net 720 ml   Filed Weights   05/30/17 2107 05/31/17 0600 06/01/17 0600  Weight: 147 lb 8 oz (66.9 kg) 152 lb 1.9 oz (69 kg) 150 lb 5.7 oz (68.2 kg)    Telemetry    NSR, HR in 60's - 70's. 12 beats NSVT this morning.  - Personally Reviewed  ECG   No new tracings.   Physical Exam   General: Well developed, Caucasian female appearing in no acute distress. Head: Normocephalic, atraumatic.  Neck: Supple without bruits, JVD at 9cm. Lungs:  Resp regular and unlabored, mild rales along bases bilaterally. Heart: RRR, S1, S2, no S3, S4, or murmur; no rub. Abdomen: Soft, non-tender, non-distended with normoactive bowel sounds. No hepatomegaly. No  rebound/guarding. No obvious abdominal masses. Extremities: No clubbing. 1+ pitting edema up to mid-shins bilaterally. Distal pedal pulses are 2+ bilaterally. Neuro: Alert and oriented X 3. Moves all extremities spontaneously. Psych: Normal affect.  Labs    Chemistry Recent Labs  Lab 05/30/17 1744 05/31/17 0546 06/01/17 0416  NA 137 134* 137  K 4.7 4.1 3.6  CL 95* 93* 95*  CO2 27 28 30   GLUCOSE 145* 230* 148*  BUN 55* 56* 55*  CREATININE 1.69* 1.68* 1.67*  CALCIUM 9.1 8.6* 8.6*  PROT 6.5 5.9* 5.6*  ALBUMIN 3.2* 2.9* 2.7*  AST 304* 244* 187*  ALT 431* 358* 298*  ALKPHOS 177* 163* 143*  BILITOT 1.1 1.1 1.0  GFRNONAA 29* 29* 29*  GFRAA 34* 34* 34*  ANIONGAP 15 13 12      Hematology Recent Labs  Lab 05/30/17 1744 05/31/17 0546  WBC 10.7* 10.3  RBC 4.74 4.65  HGB 12.7 12.4  HCT 40.4 39.9  MCV 85.2 85.8  MCH 26.8 26.7  MCHC 31.4 31.1  RDW 14.8 14.9  PLT 192 153    Cardiac Enzymes Recent Labs  Lab 05/30/17 1744 05/31/17 0028 05/31/17 0546  TROPONINI 0.06* 0.05* 0.05*   No results for input(s): TROPIPOC in the last 168 hours.   BNP  Recent Labs  Lab 05/30/17 1744  BNP 2,426.0*     DDimer No results for input(s): DDIMER in the last 168 hours.   Radiology    Dg Chest 2 View  Result Date: 05/30/2017 CLINICAL DATA:  Shortness of breath EXAM: CHEST  2 VIEW COMPARISON:  02/14/2017 FINDINGS: Linear atelectasis or scarring at the bases. No acute consolidation or effusion. Stable cardiomegaly with aortic atherosclerosis. No pneumothorax. IMPRESSION: 1. Linear scarring or atelectasis at both lung bases. No focal pulmonary opacity is seen 2. Stable mild cardiomegaly. Electronically Signed   By: Jasmine PangKim  Fujinaga M.D.   On: 05/30/2017 17:42   [place] Abdomen Complete  Result Date: 05/31/2017 CLINICAL DATA:  Acute renal failure.  Elevated liver transaminases. EXAM: ABDOMEN ULTRASOUND COMPLETE COMPARISON:  CT, 01/24/2017 FINDINGS: Gallbladder: Multiple dependent gallstones,  largest measuring 1.7 cm. Patient is tender to transducer pressure over the gallbladder. Wall measures 2.6 mm in thickness. No pericholecystic fluid. Common bile duct: Diameter: 3 mm Liver: No focal lesion identified. Within normal limits in parenchymal echogenicity. Portal vein is patent on color Doppler imaging with normal direction of blood flow towards the liver. IVC: No abnormality visualized. Pancreas: Suboptimal visualization.  Cannot accurately assess. Spleen: Size and appearance within normal limits. Right Kidney: Length: 10.2 cm. Echogenicity within normal limits. No mass or hydronephrosis visualized. Left Kidney: Length: 8.3 cm. Normal overall parenchymal echogenicity, but not well visualized due to overlying bowel gas. No gross hydronephrosis or obvious mass. Abdominal aorta: Not well visualized. The aneurysm noted on the current CT is not appreciated sonographically. Maximum AP diameter is 2 cm. Other findings: Right pleural effusion. IMPRESSION: 1. No acute findings. No sonographic evidence of acute cholecystitis. 2. Multiple gallstones. 3. Normal sonographic appearance of the liver. 4. Pancreas mostly obscured by bowel gas. Left kidneys suboptimally assessed. 5. Known infrarenal abdominal aortic aneurysm not visualized sonographically. 6. Right pleural effusion. Electronically Signed   By: Amie Portlandavid  Ormond M.D.   On: 05/31/2017 15:00   Dg Chest Port 1 View  Result Date: 05/31/2017 CLINICAL DATA:  PICC placement. EXAM: PORTABLE CHEST 1 VIEW COMPARISON:  Frontal and lateral views yesterday. FINDINGS: Right upper extremity PICC with tip in the distal SVC. Cardiomegaly is similar allowing for differences in technique. Atherosclerosis of the thoracic aorta. Development of peribronchial thickening, fluid in the minor fissure and small right pleural effusion from prior exam. Subsegmental atelectasis or scarring at the left lung base. No pneumothorax. IMPRESSION: 1. Right upper extremity PICC tip in the  distal SVC. No pneumothorax. 2. Development of pulmonary edema and right pleural effusion since yesterday, suspicious for CHF. Cardiomegaly is grossly stable. Electronically Signed   By: Rubye OaksMelanie  Ehinger M.D.   On: 05/31/2017 21:43    Cardiac Studies   Echocardiogram: 05/31/2017 Study Conclusions  - Left ventricle: The cavity size was mildly dilated. Wall   thickness was at the upper limits of normal. Systolic function   was severely reduced. The estimated ejection fraction was in the   range of 15% to 20%. Diffuse hypokinesis. There is akinesis of   the inferoseptal myocardium. The study is not technically   sufficient to allow evaluation of LV diastolic function. - Ventricular septum: Septal motion showed abnormal function and   dyssynergy. - Aortic valve: Moderately calcified annulus. Trileaflet. - Mitral valve: Mildly calcified annulus. There was moderate   regurgitation. - Left atrium: The atrium was severely dilated. - Right ventricle: The cavity size was mildly dilated. Systolic   function was moderately to severely reduced. -  Right atrium: The atrium was mildly to moderately dilated.   Central venous pressure (est): 8 mm Hg. - Tricuspid valve: There was severe regurgitation. - Pulmonary arteries: PA peak pressure: 51 mm Hg (S). - Pericardium, extracardiac: There was no pericardial effusion.  Impressions:  - Upper normal LV wall thickness with mild chamber dilatation and   LVEF approximately 15-20%. There is diffuse hypokinesis with   regional variation and akinesis of the inferoseptal wall and   septal dyssynergy. Indeterminate diastolic function, but appears   to be restrictive filling pattern. Severe left atrial   enlargement. Mildly calcified mitral annulus with at least   moderate mitral regurgitation. moderate aortic annular   calcification. Moderately dilated right ventricle with moderately   to severely reduced contraction. Mild to moderate right atrial    enlargement. Severe tricuspid regurgitation with PASP estimated   at 51 mmHg.  Patient Profile     71 y.o. female w/ PMH of CAD (s/p stenting of RCA in 2001, BMS to LAD in 2009 with total occlusion of previously placed RCA stent noted with collateral flow present), ischemic cardiomyopathy (EF 30-35% by echo in 02/2017), PAD (s/p aortobifemoral bypass in 02/2017), post-operative atrial fibrillation (after surgery in 02/2017, not started on anticoagulation), HTN, HLD, and AAA who presented to [facility] ED on 05/30/2017 for worsening dyspnea. Cardiology consulted for further management of CHF.  Assessment & Plan    1. Acute on Chronic Combined Systolic and Diastolic CHF - the patient has a known reduced EF of 30-35% by echo in 02/2017. Recently admitted in [place] for an acute CHF exacerbation but her dyspnea, orthopnea, and lower extremity edema represented upon returning home.   - BNP elevated to 2426 on admission. Repeat echo shows a further reduced EF of 15-20% with diffuse HK. - on IV Lasix 40mg  BID. Strict I&O's have not been recorded but listed as having 3 urine occurrences due to incontinence. Please obtain I&O's along with daily weights. Weight has trended down from 152 lbs on 12/18 to 144 lbs today (standing weight was obtained during this encounter). Reports baseline of 135 lbs. Co-ox at 1.2. Coreg dosing reduced to 3.125mg  BID in the setting of hypotension. No ACE-I/ARB/ARNI secondary to hypotension and AKI. Would continue with IV Lasix at current dosing as creatinine remains stable and she is responding well.   2. CAD/Elevated Troponin  - s/p stenting of RCA in 2001, BMS to LAD in 2009 with total occlusion of previously placed RCA stent noted with collateral flow present. - cyclic troponin values have been flat at 0.06 and 0.05. Does not represent ACS and likely secondary to demand ischemia in the setting of her CHF exacerbation.  - she denies any recent chest pain. Has baseline  dyspnea on exertion.  - continue ASA, BB, and statin therapy.   3. PAD - s/p aortobifemoral bypass in 02/2017.  - followed by Vascular Surgery.    4. Post-operative atrial fibrillation - after surgery in 02/2017, no known recurrence.  - Not on anticoagulation due to the arrhythmia occurring in the post-operative setting. Maintaining NSR this admission.   5. AKI - creatinine 1.69 on admission, stable at 1.67 today (previously 1.00 in 02/2017).  - in talking with the patient, she had been taking BOTH Entresto and Lisinopril while at rehab and for a week upon returning home. She since discontinued Entresto due to cost. Discussed with the patient she should make [place] aware prior to self-discontinuing medications or starting previously used medications as being on both  of these at the same time could have contributed to her AKI. - obtain daily BMET while receiving IV Lasix.   6. Transaminitis - AST 244 and ALT 358. Abdominal US shows known gallstones but no acute findings.  - continue to follow.   7. NSVT - noted to have 12 beats NSVT this morning.  - K+ 3.6. Will add-on Mg to AM labs. Keep K+ ~ 4.0 and Mg ~ 2.0.   For questions or updates, please contact [facility] Please consult [email or web address] for contact info under Cardiology/STEMI.   Lorri Frederick , PA-C 7:32 AM 06/01/2017 Pager: [phone]   Attending note:  Patient seen and examined. Case discussed with Ms. Iran Ouch PA-C. Ms. Wiesman feels somewhat better in terms of breathlessness. PICC line was placed yesterday, awaiting full co-oximetry results. Follow-up echocardiogram from yesterday shows severe reduction in LVEF at approximately 20%, possibly restrictive diastolic filling pattern although overall inconclusive, at least moderate mitral regurgitation, right ventricular dysfunction, and severe tricuspid regurgitation with PASP 51 mmHg. Net urine output is incomplete. Recorded weight however is down to  144. Heart rhythm by telemetry this morning looks to be atrial flutter with variable conduction, also had a burst of NSVT. She has some crackles on examination the lungs, probably right sided and decreased at the bases. Current medications include aspirin, Coreg (dose reduced), Zocor, and IV Lasix. Lab work shows creatinine down to 1.67, AST 187, ALT 298. Plan to follow-up on complete co-oximetry results, if this is significantly reduced can discuss transfer to [facility] for inotropic therapy and management by the Heart Failure team. Overall, her prognosis looks to be poor based on comorbidities and present situation however.  Jonelle Sidle, M.D., F.A.C.C.

## 2017-06-01 NOTE — Progress Notes (Signed)
New Admission Note:   Arrival Method: via carelink from [facility] [address] Mental Orientation: A&O x4 Telemetry: initiated, verified, a.fib Skin:  scar on abdomen, BLE edema, abdominal swelling-per patient Pain: denies Tubes:  right arm, double lumen PICC (old bloody drainage) Safety Measures: implemented  Orders to be reviewed and implemented. Will continue to monitor the patient. Call light has been placed within reach and bed alarm has been activated.   Mar DaringJequetta Xayla Puzio, RN Phone: [phone]

## 2017-06-01 NOTE — Progress Notes (Signed)
   Co-ox was checked this morning and resulted at 54%. Results discussed with Dr. Diona BrownerMcDowell who recommended transfer to [facility] so the patient can be assessed by the Advanced Heart Failure team. Reviewed plan of care with the patient and she is in agreement with this. Notified the admitting team along with Trish at [facility].   Signed, Ellsworth LennoxBrittany M Iana Buzan, PA-C 06/01/2017, 1:02 PM

## 2017-06-01 NOTE — Progress Notes (Addendum)
PROGRESS NOTE  Charlotte Salas UJW:[id number]:[id number] DOB: 04/28/1946 DOA: 05/30/2017 PCP: Stoney BangWinikur, Melinda, FNP  Brief History:  71 year old female with a history of coronary artery disease, systolic CHF with EF 30-35%, hypertension, diabetes mellitus, postoperative atrial fibrillation, peripheral arterial disease, hyperlipidemia presented with 2-day history of shortness of breath, orthopnea, lower extremity edema, and increasing abdominal girth.  [place] 3 [place] for CHF exacerbation.  The patient was discharged home with furosemide 40 mg po twice daily.  She states she was compliant with her medications as well as her diet and fluid restrictions.  The patient stated that she weighed herself, and her last weight on the morning of May 30, 2017 was 150 pounds.  Upon presentation, the patient was noted to have serum creatinine 1.69.  Chest x-ray showed bibasilar scarring.  BNP was 2126.  Troponin was 0.06.  The patient was started on intravenous furosemide.  Pt states she has not taken Entresto beyond September 2018 due to cost. The patient was started on intravenous furosemide with good clinical effect.  Cardiology was consulted to assist.  A PICC line was placed and Co-oximetry was checked.  With low values on co-oximetry, decision was made to [facility] consultation with advance heart failure team.  Assessment/Plan: Acute on chronic systolic CHF -February 12, 2017 echo EF 30-35%, diffuse HK, moderate MR/TR, PAS P 45 -May 31, 2017 echo EF 15-20%, PAS P 51, moderate MR, severe TR -Despite moderate TR contributing to JVD, the patient remains clinically fluid overloaded -Continue intravenous furosemide--> 40 mg twice daily -Consult cardiology--appreciated-->[facility] -PICC line placed on 05/31/17--low Co-ox--total oxygen content 9.8  with 54.1% oxygen sat -February 19, 2017 discharge weight 146 pounds March 01, 2017 office weight-134 pounds -Daily weights--NEG 6 lbs (150 lbs at home prior to admit) -Strict I's and O's--not accurate  Elevated troponin -Secondary to AK I and CHF exacerbation -No chest pain -Personally reviewed EKG--LBBB, sinus rhythm -Trend is flat  NSVT -pt had aflutter with 12 beats NSVT am 12/19 -continue coreg -optimize electrolytes  -management per cardiology  Acute on chronic renal failure--CKD3 -Baseline creatinine 0.9-1.2 -Presented creatinine 1.69--suspect there is a degree of cardiorenal syndrome -am BMP -renal US--no hydronephrosis  Transaminasemia -likely hepatic congestion from CHF -RUQ US--cholelithiasis without cholecystitis -LFTs--trending down  Essential hypertension -Decrease carvedilol secondary to soft blood pressure and due to low EF  Tobacco abuse/COPD -Patient has over 40-pack-year history -Has not had a cigarette in over 1 week -Tobacco cessation discussed  Diabetes mellitus -Holding metformin--likely will not restarted given the patient's worsening renal function -NovoLog sliding scale for now -check A1C--7.9 -increase to moderate SSI  Postoperative atrial fibrillation -Presently in sinus rhythm but having intermittent episodes of aflutter with NSVT  Coronary artery disease -Continue aspirin and statin -BMS to LAD 2009 -No chest pain presently   Disposition Plan:   [facility]  Family Communication:  Updated daughter on phone 12/19--Total time spent 35 minutes.  Greater than 50% spent face to face counseling and coordinating care.  Consultants:  cardiology  Code Status:  FULL   DVT Prophylaxis:  [place] Lovenox   Procedures: As Listed in Progress Note Above  Antibiotics: None     Subjective: Patient breathing better today but still has dyspnea on exertion.  She denies any fevers, chills, chest pain,  nausea, vomiting, diarrhea, abdominal pain.  No dysuria or hematuria.  No rashes.  Objective: Vitals:   06/01/17 0600 06/01/17 0752 06/01/17 0807 06/01/17 1029  BP: 96/68  (!) 146/72 (!) 106/55  Pulse: 75  86 74  Resp:      Temp:      TempSrc:      SpO2:   100% 98%  Weight: 68.2 kg (150 lb 5.7 oz) 65.7 kg (144 lb 12.8 oz)    Height:        Intake/Output Summary (Last 24 hours) at 06/01/2017 1257 Last data filed at 06/01/2017 1200 Gross per 24 hour  Intake 1280 ml  Output -  Net 1280 ml   Weight change: 0.16 kg (5.7 oz) Exam:   General:  Pt is alert, follows commands appropriately, not in acute distress  HEENT: No icterus, No thrush, No neck mass, [place]/AT  Cardiovascular: RRR, S1/S2, no rubs, no gallops  Respiratory: CTA bilaterally, no wheezing, no crackles, no rhonchi  Abdomen: Soft/+BS, non tender, non distended, no guarding  Extremities: trace LE edema, No lymphangitis, No petechiae, No rashes, no synovitis   Data Reviewed: I have personally reviewed following labs and imaging studies Basic Metabolic Panel: Recent Labs  Lab 05/30/17 1744 05/31/17 0546 06/01/17 0416 06/01/17 0738  NA 137 134* 137  --   K 4.7 4.1 3.6  --   CL 95* 93* 95*  --   CO2 27 28 30   --   GLUCOSE 145* 230* 148*  --   BUN 55* 56* 55*  --   CREATININE 1.69* 1.68* 1.67*  --   CALCIUM 9.1 8.6* 8.6*  --   MG  --   --   --  2.2   Liver Function Tests: Recent Labs  Lab 05/30/17 1744 05/31/17 0546 06/01/17 0416  AST 304* 244* 187*  ALT 431* 358* 298*  ALKPHOS 177* 163* 143*  BILITOT 1.1 1.1 1.0  PROT 6.5 5.9* 5.6*  ALBUMIN 3.2* 2.9* 2.7*   No results for input(s): LIPASE, AMYLASE in the last 168 hours. No results for input(s): AMMONIA in the last 168 hours. Coagulation Profile: No results for input(s): INR, PROTIME in the last 168 hours. CBC: Recent Labs  Lab 05/30/17 1744 05/31/17 0546  WBC 10.7* 10.3  HGB 12.7 12.4  HCT 40.4 39.9  MCV 85.2 85.8  PLT 192 153    Cardiac Enzymes: Recent Labs  Lab 05/30/17 1744 05/31/17 0028 05/31/17 0546  TROPONINI 0.06* 0.05* 0.05*   BNP: Invalid input(s): POCBNP CBG: Recent Labs  Lab 05/31/17 1136 05/31/17 1610 05/31/17 2149 06/01/17 0734 06/01/17 1126  GLUCAP 191* 150* 189* 124* 216*   HbA1C: Recent Labs    05/30/17 1744  HGBA1C 7.8*   Urine analysis:    Component Value Date/Time   COLORURINE YELLOW 02/07/2017 1153   APPEARANCEUR CLEAR 02/07/2017 1153   LABSPEC 1.008 02/07/2017 1153   PHURINE 5.0 02/07/2017 1153   GLUCOSEU NEGATIVE 02/07/2017 1153   HGBUR NEGATIVE 02/07/2017 1153   BILIRUBINUR NEGATIVE 02/07/2017 1153   KETONESUR NEGATIVE 02/07/2017 1153   PROTEINUR NEGATIVE 02/07/2017 1153   NITRITE NEGATIVE 02/07/2017 1153   LEUKOCYTESUR NEGATIVE 02/07/2017 1153   Sepsis Labs: @LABRCNTIP (procalcitonin:4,lacticidven:4) )No results found for this or any previous visit (from the past 240 hour(s)).   Scheduled Meds: . aspirin EC  81 mg Oral QPM  . carvedilol  3.125 mg Oral BID WC  . enoxaparin (LOVENOX) injection  30 mg Subcutaneous Q24H  . furosemide  40 mg Intravenous Q12H  .  insulin aspart  0-9 Units Subcutaneous TID WC  . pregabalin  75 mg Oral QHS  . simvastatin  20 mg Oral q1800  . sodium chloride flush  3 mL Intravenous Q12H   Continuous Infusions: . sodium chloride      Procedures/Studies: Dg Chest 2 View  Result Date: 05/30/2017 CLINICAL DATA:  Shortness of breath EXAM: CHEST  2 VIEW COMPARISON:  02/14/2017 FINDINGS: Linear atelectasis or scarring at the bases. No acute consolidation or effusion. Stable cardiomegaly with aortic atherosclerosis. No pneumothorax. IMPRESSION: 1. Linear scarring or atelectasis at both lung bases. No focal pulmonary opacity is seen 2. Stable mild cardiomegaly. Electronically Signed   By: Jasmine Pang M.D.   On: 05/30/2017 17:42   US Abdomen Complete  Result Date: 05/31/2017 CLINICAL DATA:  Acute renal failure.  Elevated liver  transaminases. EXAM: ABDOMEN ULTRASOUND COMPLETE COMPARISON:  CT, 01/24/2017 FINDINGS: Gallbladder: Multiple dependent gallstones, largest measuring 1.7 cm. Patient is tender to transducer pressure over the gallbladder. Wall measures 2.6 mm in thickness. No pericholecystic fluid. Common bile duct: Diameter: 3 mm Liver: No focal lesion identified. Within normal limits in parenchymal echogenicity. Portal vein is patent on color Doppler imaging with normal direction of blood flow towards the liver. IVC: No abnormality visualized. Pancreas: Suboptimal visualization.  Cannot accurately assess. Spleen: Size and appearance within normal limits. Right Kidney: Length: 10.2 cm. Echogenicity within normal limits. No mass or hydronephrosis visualized. Left Kidney: Length: 8.3 cm. Normal overall parenchymal echogenicity, but not well visualized due to overlying bowel gas. No gross hydronephrosis or obvious mass. Abdominal aorta: Not well visualized. The aneurysm noted on the current CT is not appreciated sonographically. Maximum AP diameter is 2 cm. Other findings: Right pleural effusion. IMPRESSION: 1. No acute findings. No sonographic evidence of acute cholecystitis. 2. Multiple gallstones. 3. Normal sonographic appearance of the liver. 4. Pancreas mostly obscured by bowel gas. Left kidneys suboptimally assessed. 5. Known infrarenal abdominal aortic aneurysm not visualized sonographically. 6. Right pleural effusion. Electronically Signed   By: Amie Portland M.D.   On: 05/31/2017 15:00   Dg Chest Port 1 View  Result Date: 05/31/2017 CLINICAL DATA:  PICC placement. EXAM: PORTABLE CHEST 1 VIEW COMPARISON:  Frontal and lateral views yesterday. FINDINGS: Right upper extremity PICC with tip in the distal SVC. Cardiomegaly is similar allowing for differences in technique. Atherosclerosis of the thoracic aorta. Development of peribronchial thickening, fluid in the minor fissure and small right pleural effusion from prior exam.  Subsegmental atelectasis or scarring at the left lung base. No pneumothorax. IMPRESSION: 1. Right upper extremity PICC tip in the distal SVC. No pneumothorax. 2. Development of pulmonary edema and right pleural effusion since yesterday, suspicious for CHF. Cardiomegaly is grossly stable. Electronically Signed   By: Rubye Oaks M.D.   On: 05/31/2017 21:43    Catarina Hartshorn, DO  [facility] Pager [phone]  If 7PM-7AM, please contact night-coverage [email or web address] Password [email or web address] 06/01/2017, 12:57 PM   LOS: 1 day

## 2017-06-02 DIAGNOSIS — I5023 Acute on chronic systolic (congestive) heart failure: Secondary | ICD-10-CM

## 2017-06-02 LAB — BASIC METABOLIC PANEL
Anion gap: 10 (ref 5–15)
BUN: 50 mg/dL — AB (ref 6–20)
CALCIUM: 8.5 mg/dL — AB (ref 8.9–10.3)
CO2: 32 mmol/L (ref 22–32)
CREATININE: 1.73 mg/dL — AB (ref 0.44–1.00)
Chloride: 95 mmol/L — ABNORMAL LOW (ref 101–111)
GFR calc Af Amer: 33 mL/min — ABNORMAL LOW (ref >60)
GFR, EST NON AFRICAN AMERICAN: 28 mL/min — AB (ref >60)
GLUCOSE: 113 mg/dL — AB (ref 65–99)
Potassium: 4.3 mmol/L (ref 3.5–5.1)
SODIUM: 137 mmol/L (ref 135–145)

## 2017-06-02 LAB — CBC WITH DIFFERENTIAL/PLATELET
BASOS ABS: 0 10*3/uL (ref 0.0–0.1)
Basophils Relative: 0 %
EOS PCT: 2 %
Eosinophils Absolute: 0.1 10*3/uL (ref 0.0–0.7)
HEMATOCRIT: 39.9 % (ref 36.0–46.0)
Hemoglobin: 12.7 g/dL (ref 12.0–15.0)
LYMPHS ABS: 3.3 10*3/uL (ref 0.7–4.0)
LYMPHS PCT: 38 %
MCH: 27 pg (ref 26.0–34.0)
MCHC: 31.8 g/dL (ref 30.0–36.0)
MCV: 84.9 fL (ref 78.0–100.0)
MONO ABS: 1 10*3/uL (ref 0.1–1.0)
Monocytes Relative: 11 %
NEUTROS ABS: 4.3 10*3/uL (ref 1.7–7.7)
Neutrophils Relative %: 49 %
PLATELETS: 192 10*3/uL (ref 150–400)
RBC: 4.7 MIL/uL (ref 3.87–5.11)
RDW: 14.7 % (ref 11.5–15.5)
WBC: 8.7 10*3/uL (ref 4.0–10.5)

## 2017-06-02 LAB — GLUCOSE, CAPILLARY
GLUCOSE-CAPILLARY: 181 mg/dL — AB (ref 65–99)
GLUCOSE-CAPILLARY: 124 mg/dL — AB (ref 65–99)
Glucose-Capillary: 104 mg/dL — ABNORMAL HIGH (ref 65–99)
Glucose-Capillary: 252 mg/dL — ABNORMAL HIGH (ref 65–99)

## 2017-06-02 LAB — COOXEMETRY PANEL
Carboxyhemoglobin: 1.2 % (ref 0.5–1.5)
Methemoglobin: 1.2 % (ref 0.0–1.5)
O2 Saturation: 72.2 %
TOTAL HEMOGLOBIN: 11.9 g/dL — AB (ref 12.0–16.0)

## 2017-06-02 MED ORDER — DIGOXIN 125 MCG PO TABS
0.0625 mg | ORAL_TABLET | Freq: Every day | ORAL | Status: DC
  Administered 2017-06-02 – 2017-06-05 (×4): 0.0625 mg via ORAL
  Filled 2017-06-02 (×5): qty 1

## 2017-06-02 MED ORDER — HYDRALAZINE HCL 20 MG/ML IJ SOLN: 10.0000 mg | Freq: Three times a day (TID) | INTRAMUSCULAR | Status: DC | PRN

## 2017-06-02 MED ORDER — FUROSEMIDE 10 MG/ML IJ SOLN
40.0000 mg | Freq: Once | INTRAMUSCULAR | Status: AC
  Administered 2017-06-02: 40 mg via INTRAVENOUS
  Filled 2017-06-02: qty 4

## 2017-06-02 MED ORDER — FUROSEMIDE 10 MG/ML IJ SOLN
80.0000 mg | Freq: Two times a day (BID) | INTRAMUSCULAR | Status: DC
  Administered 2017-06-02 – 2017-06-05 (×6): 80 mg via INTRAVENOUS
  Filled 2017-06-02 (×6): qty 8

## 2017-06-02 MED ORDER — SODIUM CHLORIDE 0.9% FLUSH
10.0000 mL | INTRAVENOUS | Status: DC | PRN
  Administered 2017-06-08: 10 mL
  Filled 2017-06-02: qty 40

## 2017-06-02 MED ORDER — LISINOPRIL 2.5 MG PO TABS
2.5000 mg | ORAL_TABLET | Freq: Every day | ORAL | Status: DC
  Administered 2017-06-02 – 2017-06-05 (×4): 2.5 mg via ORAL
  Filled 2017-06-02 (×4): qty 1

## 2017-06-02 NOTE — Plan of Care (Signed)
  Cardiac: Ability to achieve and maintain adequate cardiopulmonary perfusion will improve 06/02/2017 0125 - Progressing by Jeanella Flatteryhomas, Saria Haran T, RN   Education: Ability to demonstrate management of disease process will improve 06/02/2017 0125 - Progressing by Jeanella Flatteryhomas, Arvis Miguez T, RN

## 2017-06-02 NOTE — Progress Notes (Signed)
Pt voided 200 ML since removal of foley.

## 2017-06-02 NOTE — Progress Notes (Signed)
Pt wean of oxygen 2L [place], saturating at 100% on RA.

## 2017-06-02 NOTE — Progress Notes (Signed)
Foley removed per unit protocol. Awaiting patient to void

## 2017-06-02 NOTE — Consult Note (Signed)
Advanced Heart Failure Team Consult Note  Primary Cardiologist:  Dr. Wyline Mood  Reason for Consultation: A/C systolic CHF  HPI:    Charlotte Salas is seen today for evaluation of A/C systolic CHF at the request of Dr. Diona Browner.   Charlotte Salas is a 71 y.o. female with PMH CAD (s/p stenting of RCA in 2001, BMS to LAD in 2009 with total occlusion of previously placed RCA stent noted with collateral flow present), ICM, Systolic CHF, PAD s/p aortobifemoral bypass in 02/2017, post op afib, HTN, HLD, and AAA.   Pt presented to [facility] ED 05/30/17 with worsening dyspnea. Pertinent labs on admission include BNP 2426, Creatinine 1.69, K 4.7,  WBC 10.7, Hgb 12.7, and troponin mildly elevated at 0.06 without trend. Diuresed on IV lasix 40 mg BID. Weight 152 lbs on 12/18. Reported baseline of ~135 lbs at home.     Coox checked on am of 06/01/17 depressed at 54%, so transferred to [facility] for further evaluation by HF team.   Feeling fatigued, but better than on admit. States she has felt gradually worse over the past month with no clear trigger, just gradually more SOB. + Orthopnea, slept on 2 pillows. SOB changing clothes and bathing. She had mild dizziness standing, especially first thing in the mornings. Had been taking all medications as directed. Denies CP. She does not typically have peripheral edema, but has for the past month.   I/O + 170 cc, but inaccurate due to incontinence. Weight down another 3 lbs. (Down 11 from highest weight this admission)  CXR 05/31/17 with pulmonary edema and R pleural effusion. PICC placement stable without Pneumothorax.  Echo 05/31/17 15-20%, Moderate MR, Severe LAE, Mod/Sev RV reduction, Mild mod RAE, Severe TR, PA peak pressure 51 mm Hg.    (This is worse compared to EF 02/2017 30-35%)  RHC 02/18/2017 (Unable to access artery, see procedure note for further. Noted that could re-attempt via left radial if needed) RA = 3 RV = 43/3 PA = 47/14 (25) PCW = 14 Fick cardiac  output/index = 5.8/3.6 PVR = 1.9 WU Ao sat = 99% PA sat = 73%  Review of Systems: [y] = yes, [ ]  = no   General: Weight gain [ ] ; Weight loss [ ] ; Anorexia [ ] ; Fatigue [y]; Fever [ ] ; Chills [ ] ; Weakness [ ]   Cardiac: Chest pain/pressure [ ] ; Resting SOB [ ] ; Exertional SOB [y]; Orthopnea [y]; Pedal Edema [y]; Palpitations [ ] ; Syncope [ ] ; Presyncope [ ] ; Paroxysmal nocturnal dyspnea[ ]   Pulmonary: Cough [y]; Wheezing[ ] ; Hemoptysis[ ] ; Sputum [ ] ; Snoring [ ]   GI: Vomiting[ ] ; Dysphagia[ ] ; Melena[ ] ; Hematochezia [ ] ; Heartburn[ ] ; Abdominal pain [ ] ; Constipation [ ] ; Diarrhea [ ] ; BRBPR [ ]   GU: Hematuria[ ] ; Dysuria [ ] ; Nocturia[ ]   Vascular: Pain in legs with walking [ ] ; Pain in feet with lying flat [ ] ; Non-healing sores [ ] ; Stroke [ ] ; TIA [ ] ; Slurred speech [ ] ;  Neuro: Headaches[ ] ; Vertigo[ ] ; Seizures[ ] ; Paresthesias[ ] ;Blurred vision [ ] ; Diplopia [ ] ; Vision changes [ ]   Ortho/Skin: Arthritis [y]; Joint pain [y]; Muscle pain [ ] ; Joint swelling [ ] ; Back Pain [ ] ; Rash [ ]   Psych: Depression[ ] ; Anxiety[ ]   Heme: Bleeding problems [ ] ; Clotting disorders [ ] ; Anemia [ ]   Endocrine: Diabetes [ ] ; Thyroid dysfunction[ ]   Home Medications Prior to Admission medications   Medication Sig Start Date End Date Taking?  Authorizing Provider  aspirin EC 81 MG tablet Take 81 mg by mouth every evening.    Yes [provider]  carvedilol (COREG) 12.5 MG tablet Take 12.5 mg by mouth 2 (two) times daily with a meal.   Yes [provider]  Cranberry 1000 MG CAPS Take 1 capsule by mouth daily.    Yes [provider]  furosemide (LASIX) 40 MG tablet Take 1 tablet (40 mg total) by mouth daily. 05/20/17 08/18/17 Yes Branch, Dorothe PeaJonathan F, MD  lisinopril (PRINIVIL,ZESTRIL) 10 MG tablet Take 10 mg by mouth daily.   Yes [provider]  metFORMIN (GLUCOPHAGE) 500 MG tablet Take 500 mg by mouth daily with breakfast.    Yes [provider]  nitroGLYCERIN  (NITROSTAT) 0.4 MG SL tablet Place 1 tablet (0.4 mg total) under the tongue every 5 (five) minutes as needed for chest pain (for chest pain). 10/17/15  Yes Kathleene HazelMcAlhany, Christopher D, MD  pregabalin (LYRICA) 75 MG capsule Take 75 mg by mouth at bedtime.    Yes [provider]  simvastatin (ZOCOR) 20 MG tablet Take 1 tablet (20 mg total) by mouth daily. 01/26/17  Yes Branch, Dorothe PeaJonathan F, MD  traMADol (ULTRAM) 50 MG tablet Take 1 tablet (50 mg total) by mouth every 6 (six) hours as needed. 04/04/17  Yes Nada LibmanBrabham, Vance W, MD    Past Medical History: Past Medical History:  Diagnosis Date  . Atrial septal defect    hx of it  . CAD (coronary artery disease)    s/p prior stenting of the right coronary artery in '01 and non-ST-elevation myocardial infarction in jan '09, with a new bare-metal stent placed in the ostium of the left anterior descending coronary artert and the coronary anatomy as described above with total occlusion of the previously placed right coronary artery stent. EF 35-40%  . CHF (congestive heart failure) (HCC)   . Diabetes mellitus without complication (HCC)   . Hepatitis 1971   " A"  . History of cervical cancer   . History of non-Hodgkin's lymphoma   . HTN (hypertension)   . HTN (hypertension)   . Hyperlipidemia   . Ischemic cardiomyopathy    EF 35-40% by 01/2016 echo  . Myocardial infarction (HCC) 2009, 2000  . Other and unspecified hyperlipidemia   . Peripheral vascular disease (HCC)   . Personal history of malignant neoplasm of cervix uteri   . Personal history of other lymphatic and hematopoietic neoplasm 2002   hodgkins lymphoma    Past Surgical History: Past Surgical History:  Procedure Laterality Date  . ABDOMINAL AORTOGRAM N/A 01/11/2017   Procedure: Abdominal Aortogram;  Surgeon: Nada LibmanBrabham, Vance W, MD;  Location: [facility] INVASIVE CV [facility];  Service: Cardiovascular;  Laterality: N/A;  . ANGIOPLASTY Left 02/09/2017   Procedure: LEFT PROFUNDA  FEMORAL ARTERY  ANGIOPLASTY USING XENOSURE BIOLOGIC PATCH;  Surgeon: Nada LibmanBrabham, Vance W, MD;  Location: [facility] OR;  Service: Vascular;  Laterality: Left;  . AORTA - BILATERAL FEMORAL ARTERY BYPASS GRAFT N/A 02/09/2017   Procedure: AORTA BIFEMORAL BYPASS GRAFT USING HEMASHIELD GOLD;  Surgeon: Nada LibmanBrabham, Vance W, MD;  Location: [facility] OR;  Service: Vascular;  Laterality: N/A;  . ASD AND VSD REPAIR    . LOWER EXTREMITY ANGIOGRAPHY Bilateral 01/11/2017   Procedure: Lower Extremity Angiography;  Surgeon: Nada LibmanBrabham, Vance W, MD;  Location: [facility] INV[facility];  Service: Cardiovascular;  Laterality: Bilateral;  . RIGHT HEART CATH N/A 02/18/2017   Procedure: RIGHT HEART CATH;  Surgeon: Dolores PattyBensimhon, Daniel R, MD;  Location:  [facility] INVAS[facility] CV [facility];  Service: Cardiovascular;  Laterality: N/A;  . VEIN SURGERY Bilateral Jan. Feb. and Mar. 2018   Dr. Earna Coderzachary [place]    Family History: Family History  Problem Relation Age of Onset  . Leukemia Father   . Heart Problems Mother   . Heart attack Sister     Social History: Social History   Socioeconomic History  . Marital status: Divorced    Spouse name: None  . Number of children: None  . Years of education: None  . Highest education level: None  Social Needs  . Financial resource strain: None  . Food insecurity - worry: None  . Food insecurity - inability: None  . Transportation needs - medical: None  . Transportation needs - non-medical: None  Occupational History  . None  Tobacco Use  . Smoking status: Current Some Day Smoker    Packs/day: 0.25    Years: 50.00    Pack years: 12.50    Types: Cigarettes, E-cigarettes  . Smokeless tobacco: Never Used  . Tobacco comment: 1 ppd  Substance and Sexual Activity  . Alcohol use: No  . Drug use: No  . Sexual activity: None  Other Topics Concern  . None  Social History Narrative   [place] in [place], [place]   Tracer study.     Allergies:  Allergies  Allergen Reactions  . Codeine Nausea Only     Objective:    Vital Signs:   Temp:  [97.6 F (36.4 C)-99 F (37.2 C)] 98 F (36.7 C) (12/20 0512) Pulse Rate:  [73-95] 73 (12/20 0512) Resp:  [18-22] 18 (12/20 0512) BP: (95-131)/(52-88) 107/75 (12/20 0512) SpO2:  [91 %-98 %] 98 % (12/20 0512) Weight:  [141 lb 8.6 oz (64.2 kg)-143 lb 4.8 oz (65 kg)] 141 lb 8.6 oz (64.2 kg) (12/20 0512) Last BM Date: 05/31/17  Weight change: Filed Weights   06/01/17 0752 06/01/17 2235 06/02/17 0512  Weight: 144 lb 12.8 oz (65.7 kg) 143 lb 4.8 oz (65 kg) 141 lb 8.6 oz (64.2 kg)    Intake/Output:   Intake/Output Summary (Last 24 hours) at 06/02/2017 1050 Last data filed at 06/02/2017 0643 Gross per 24 hour  Intake 1280 ml  Output 1350 ml  Net -70 ml      Physical Exam    General:  Elderly and fatigued appearing.  HEENT: normal Neck: supple. JVP 9-10. Carotids 2+ bilat; no bruits. No lymphadenopathy or thyromegaly appreciated. Cor: PMI nondisplaced. Regular rate & rhythm with ? Ectopy at times. No M/G/R noted.  Lungs: Diminished basilar sounds with bilateral crackles. Abdomen: soft, nontender, nondistended. No hepatosplenomegaly. No bruits or masses. Good bowel sounds. Extremities: no cyanosis, clubbing, or rash. Warm. 2+ edema.  Neuro: alert & orientedx3, cranial nerves grossly intact. moves all 4 extremities w/o difficulty. Affect pleasant  Telemetry   NSR with 1 degree AV block, Personally reviewed.   EKG    05/31/17 NSR 85 bpm, with sinus arrhythmia, personally reviewed.   Labs   Basic Metabolic Panel: Recent Labs  Lab 05/30/17 1744 05/31/17 0546 06/01/17 0416 06/01/17 0738 06/02/17 0732  NA 137 134* 137  --  137  K 4.7 4.1 3.6  --  4.3  CL 95* 93* 95*  --  95*  CO2 27 28 30   --  32  GLUCOSE 145* 230* 148*  --  113*  BUN 55* 56* 55*  --  50*  CREATININE 1.69* 1.68* 1.67*  --  1.73*  CALCIUM 9.1 8.6* 8.6*  --  8.5*  MG  --   --   --  2.2  --     Liver Function Tests: Recent Labs  Lab 05/30/17 1744  05/31/17 0546 06/01/17 0416  AST 304* 244* 187*  ALT 431* 358* 298*  ALKPHOS 177* 163* 143*  BILITOT 1.1 1.1 1.0  PROT 6.5 5.9* 5.6*  ALBUMIN 3.2* 2.9* 2.7*   No results for input(s): LIPASE, AMYLASE in the last 168 hours. No results for input(s): AMMONIA in the last 168 hours.  CBC: Recent Labs  Lab 05/30/17 1744 05/31/17 0546 06/02/17 0732  WBC 10.7* 10.3 8.7  NEUTROABS  --   --  4.3  HGB 12.7 12.4 12.7  HCT 40.4 39.9 39.9  MCV 85.2 85.8 84.9  PLT 192 153 192    Cardiac Enzymes: Recent Labs  Lab 05/30/17 1744 05/31/17 0028 05/31/17 0546  TROPONINI 0.06* 0.05* 0.05*    BNP: BNP (last 3 results) Recent Labs    02/14/17 0948 05/30/17 1744  BNP 1,788.7* 2,426.0*    ProBNP (last 3 results) No results for input(s): PROBNP in the last 8760 hours.   CBG: Recent Labs  Lab 06/01/17 0734 06/01/17 1126 06/01/17 1632 06/01/17 2123 06/02/17 0729  GLUCAP 124* 216* 181* 199* 104*    Coagulation Studies: No results for input(s): LABPROT, INR in the last 72 hours.   Imaging    No results found.   Medications:     Current Medications: . aspirin EC  81 mg Oral QPM  . carvedilol  3.125 mg Oral BID WC  . enoxaparin (LOVENOX) injection  30 mg Subcutaneous Q24H  . furosemide  40 mg Intravenous Q12H  . insulin aspart  0-15 Units Subcutaneous TID WC  . pregabalin  75 mg Oral QHS  . simvastatin  20 mg Oral q1800  . sodium chloride flush  3 mL Intravenous Q12H     Infusions: . sodium chloride         Patient Profile   Charlotte Salas is a 70 y.o. female with PMH CAD (s/p stenting of RCA in 2001, BMS to LAD in 2009 with total occlusion of previously placed RCA stent noted with collateral flow present), ICM, Systolic CHF, PAD s/p aortobifemoral bypass in 02/2017, post op afib, HTN, HLD, and AAA.   Admitted to [facility] 05/30/17 with worsening dyspnea. Diuresed on IV lasix. Coox 54% on 06/01/17 so transferred to [facility] for further evaluation and treatment by HF  team.   Assessment/Plan   1. Acute on Chronic Combined Systolic and Diastolic CHF -> Biventricular failure by Echo.  - EF worsened to 15-20% from  EF of 30-35% by echo in 02/2017.  - Has had several admissions recently (One in [place]) - Volume status remains markedly elevated.  - Increase lasix to 80 mg BID, give additional 40 mg now.  - Repeat coox pending.  Will discuss R/LHC with Dr. Gala Romney. If coox low will likely need to move to stepdown to follow CVPs and coox daily.  -BNPelevated [id number] on admission.  - Hold BB with marginal coox. Repeat pending.  - ACE/ARB/ARNI held on admission with AKI.   2. CAD/Elevated Troponin  -s/p stenting of RCA in 2001, BMS to LAD in 2009 with total occlusion of previously placed RCA stent noted with collateral flow present. - Cyclic troponin values have been flat at 0.06 and 0.05. Most likely represents demand ischemia in the setting of CHF. Unable to get arterial access for cath  02/2017 and has not had ischemic work up in some time. Will likely need repeat with lower EF.  - Denies CP.  - Continue ASA, BB, and statin therapy.   3.PAD -s/p aortobifemoral bypass in 02/2017.  - Followed by vascular surgery.   4. Post-operative atrial fibrillation -After surgery in 02/2017. No recurrence noted.  - Not on anticoagulation due to the arrhythmia occurring in the post-operative setting. remains in NSR this admit.  - Check EKG.   5. AKI -Creatinine 1.73 today (Previously baseline of 1.0 in 02/2017)  - Had been taking lisinopril AND Entresto, but then STOPPED Entresto due to cost.  - Continue to follow closely.   6. Transaminitis - Slowly improving.  - Abdominal US showed known gallstones but no acute findings.   7. NSVT - Goal K > 4.0 and Mg > 2.0. Stable this am.   Pt has biventricular HF with marked volume overload.  Not good candidate for LVAD with RV failure. May need milrinone. Coox pending. Low threshold to move to stepdown  for CVPs.  Will likely need repeat R/LHC this admit, but would like creatinine to improve slightly first if possible.  Medication concerns reviewed with patient and pharmacy team. Barriers identified: none at this time.   Length of Stay: 2  Luane School  06/02/2017, 10:50 AM  Advanced Heart Failure Team Pager [phone] (M-F; 7a - 4p)  Please contact [facility] for night-coverage after hours (4p -7a ) and weekends on [email or web address]  Patient seen and examined with the above-signed Advanced Practice Provider and/or Housestaff. I personally reviewed laboratory data, imaging studies and relevant notes. I independently examined the patient and formulated the important aspects of the plan. I have edited the note to reflect any of my changes or salient points. I have personally discussed the plan with the patient and/or family.  71 y/o with CAD and PAD s/p recent aortobifem surgery now admitted with recurrent HF. EF previously 30-35% in 9/18. Echo this admit with EF 10% and severe RV dysfunction. (Personally reviewed). Remains volume overloaded but co-ox ok. Responding slowly to diuresis.   I am worried she has end-stage HF. I discussed how severe her cardiomyopathy is. We previously attempted coronary angio but were unable to gain access. I again broached the need for coronary angiography to further evaluate etiology of cardiomyopathy but she is not sure she wants to proceed. Not candidate for transplant with age and PAD. Not VAD candidate with severe RV dysfunction. We discussed possible Hopsice. We will do our best to optimize and see how she responds.   Arvilla Meres, MD  3:33 PM

## 2017-06-02 NOTE — Progress Notes (Addendum)
PROGRESS NOTE  Charlotte Salas GNF:[id number] DOB: [birth year] DOA: 05/30/2017 PCP: Stoney BangWinikur, Melinda, FNP  Brief History:  71 year old female with a history of coronary artery disease, systolic CHF with EF 30-35%, hypertension, diabetes mellitus, postoperative atrial fibrillation, peripheral arterial disease, hyperlipidemia presented with 2-day history of shortness of breath, orthopnea, lower extremity edema, and increasing abdominal girth.  Patient was recently discharged from the hospital in [place] where she was admitted for approximately 3 days for CHF exacerbation.  The patient was discharged home with furosemide 40 mg po twice daily.  She states she was compliant with her medications as well as her diet and fluid restrictions.  The patient stated that she weighed herself, and her last weight on the morning of May 30, 2017 was 150 pounds.  Upon presentation, the patient was noted to have serum creatinine 1.69.  Chest x-ray showed bibasilar scarring.  BNP was 2126.  Troponin was 0.06.  The patient was started on intravenous furosemide.  Pt states she has not taken Entresto beyond September 2018 due to cost.  Assessment/Plan: Acute on chronic systolic CHF -February 12, 2017 echo EF 30-35%, diffuse HK, moderate MR/TR, PAS P 45 -Despite moderate TR contributing to JVD, the patient remains clinically fluid overloaded -Continue intravenous furosemide--> increase to 40 mg twice daily -Consult cardiology recs pending -February 19, 2017 discharge weight 146 pounds March 01, 2017 office weight-134 pounds -Daily weights -Strict I's and O's  Elevated troponin -Secondary to AKI and CHF exacerbation -No chest pain -Personally reviewed EKG--LBBB, sinus rhythm -Trend is flat  Acute on chronic renal failure--CKD3 -Baseline creatinine 0.9-1.2 -Present creatinine 1.73 -am BMP  Transaminasemia -likely hepatic congestion from CHF -RUQ US-->gallstonesm nl liver -am  LFTs  Essential hypertension -Cont decreased carvedilol secondary to soft blood pressure  Tobacco abuse/COPD -Patient has over 40-pack-year history -Has not had a cigarette in over 1 week -Tobacco cessation discussed  Diabetes mellitus -Holding metformin--likely will not restarted given the patient's worsening renal function -NovoLog sliding scale for now -check A1C  Postoperative atrial fibrillation -Presently in sinus rhythm  Coronary artery disease -Continue aspirin and statin -BMS to LAD 2009 -No chest pain presently   Disposition Plan:   Home in 2-3 days  Family Communication:  No  Family at bedside  Consultants:  cardiology  Code Status:  FULL   DVT Prophylaxis:  [place] Lovenox   Procedures: As Listed in Progress Note Above  Antibiotics: None    Subjective: Patient is w/o complaint.  Objective: Vitals:   06/01/17 2131 06/01/17 2235 06/02/17 0512 06/02/17 1133  BP: 121/88 (!) 95/52 107/75 (!) 97/55  Pulse: 95 87 73 76  Resp: 18 18 18 18   Temp: 99 F (37.2 C) 98.8 F (37.1 C) 98 F (36.7 C) 97.8 F (36.6 C)  TempSrc: Oral Oral Oral Oral  SpO2: 98% 98% 98% 98%  Weight:  65 kg (143 lb 4.8 oz) 64.2 kg (141 lb 8.6 oz)   Height:  5' (1.524 m)      Intake/Output Summary (Last 24 hours) at 06/02/2017 1735 Last data filed at 06/02/2017 1722 Gross per 24 hour  Intake 720 ml  Output 2490 ml  Net -1770 ml   Weight change: -2.519 kg (-8.9 oz) Exam:   General:  Pt is alert, follows commands appropriately, NAD  HEENT: No icterus, No thrush, No neck mass, [place]/AT  Cardiovascular: RRR, S1/S2, no rubs, no gallops +JVD  Respiratory: Bibasilar crackle; clear to auscultation on  the left.  No wheezing  Abdomen: Soft/+BS, non tender, non distended, no guarding  Extremities: 1+ LE edema, No lymphangitis, No petechiae, No rashes, no synovitis   Data Reviewed: I have personally reviewed following labs and imaging studies Basic Metabolic Panel: Recent Labs   Lab 05/30/17 1744 05/31/17 0546 06/01/17 0416 06/01/17 0738 06/02/17 0732  NA 137 134* 137  --  137  K 4.7 4.1 3.6  --  4.3  CL 95* 93* 95*  --  95*  CO2 27 28 30   --  32  GLUCOSE 145* 230* 148*  --  113*  BUN 55* 56* 55*  --  50*  CREATININE 1.69* 1.68* 1.67*  --  1.73*  CALCIUM 9.1 8.6* 8.6*  --  8.5*  MG  --   --   --  2.2  --    Liver Function Tests: Recent Labs  Lab 05/30/17 1744 05/31/17 0546 06/01/17 0416  AST 304* 244* 187*  ALT 431* 358* 298*  ALKPHOS 177* 163* 143*  BILITOT 1.1 1.1 1.0  PROT 6.5 5.9* 5.6*  ALBUMIN 3.2* 2.9* 2.7*   No results for input(s): LIPASE, AMYLASE in the last 168 hours. No results for input(s): AMMONIA in the last 168 hours. Coagulation Profile: No results for input(s): INR, PROTIME in the last 168 hours. CBC: Recent Labs  Lab 05/30/17 1744 05/31/17 0546 06/02/17 0732  WBC 10.7* 10.3 8.7  NEUTROABS  --   --  4.3  HGB 12.7 12.4 12.7  HCT 40.4 39.9 39.9  MCV 85.2 85.8 84.9  PLT 192 153 192   Cardiac Enzymes: Recent Labs  Lab 05/30/17 1744 05/31/17 0028 05/31/17 0546  TROPONINI 0.06* 0.05* 0.05*   BNP: Invalid input(s): POCBNP CBG: Recent Labs  Lab 06/01/17 1632 06/01/17 2123 06/02/17 0729 06/02/17 1132 06/02/17 1644  GLUCAP 181* 199* 104* 181* 252*   HbA1C: Recent Labs    05/30/17 1744 06/01/17 0416  HGBA1C 7.8* 7.9*   Urine analysis:    Component Value Date/Time   COLORURINE YELLOW 02/07/2017 1153   APPEARANCEUR CLEAR 02/07/2017 1153   LABSPEC 1.008 02/07/2017 1153   PHURINE 5.0 02/07/2017 1153   GLUCOSEU NEGATIVE 02/07/2017 1153   HGBUR NEGATIVE 02/07/2017 1153   BILIRUBINUR NEGATIVE 02/07/2017 1153   KETONESUR NEGATIVE 02/07/2017 1153   PROTEINUR NEGATIVE 02/07/2017 1153   NITRITE NEGATIVE 02/07/2017 1153   LEUKOCYTESUR NEGATIVE 02/07/2017 1153   Sepsis Labs: @LABRCNTIP (procalcitonin:4,lacticidven:4) )No results found for this or any previous visit (from the past 240 hour(s)).   Scheduled  Meds: . aspirin EC  81 mg Oral QPM  . digoxin  0.0625 mg Oral Daily  . enoxaparin (LOVENOX) injection  30 mg Subcutaneous Q24H  . furosemide  80 mg Intravenous Q12H  . insulin aspart  0-15 Units Subcutaneous TID WC  . lisinopril  2.5 mg Oral QHS  . pregabalin  75 mg Oral QHS  . simvastatin  20 mg Oral q1800  . sodium chloride flush  3 mL Intravenous Q12H   Continuous Infusions: . sodium chloride      Procedures/Studies: Dg Chest 2 View  Result Date: 05/30/2017 CLINICAL DATA:  Shortness of breath EXAM: CHEST  2 VIEW COMPARISON:  02/14/2017 FINDINGS: Linear atelectasis or scarring at the bases. No acute consolidation or effusion. Stable cardiomegaly with aortic atherosclerosis. No pneumothorax. IMPRESSION: 1. Linear scarring or atelectasis at both lung bases. No focal pulmonary opacity is seen 2. Stable mild cardiomegaly. Electronically Signed   By: Jasmine PangKim  Fujinaga M.D.   On: 05/30/2017  17:42   US Abdomen Complete  Result Date: 05/31/2017 CLINICAL DATA:  Acute renal failure.  Elevated liver transaminases. EXAM: ABDOMEN ULTRASOUND COMPLETE COMPARISON:  CT, 01/24/2017 FINDINGS: Gallbladder: Multiple dependent gallstones, largest measuring 1.7 cm. Patient is tender to transducer pressure over the gallbladder. Wall measures 2.6 mm in thickness. No pericholecystic fluid. Common bile duct: Diameter: 3 mm Liver: No focal lesion identified. Within normal limits in parenchymal echogenicity. Portal vein is patent on color Doppler imaging with normal direction of blood flow towards the liver. IVC: No abnormality visualized. Pancreas: Suboptimal visualization.  Cannot accurately assess. Spleen: Size and appearance within normal limits. Right Kidney: Length: 10.2 cm. Echogenicity within normal limits. No mass or hydronephrosis visualized. Left Kidney: Length: 8.3 cm. Normal overall parenchymal echogenicity, but not well visualized due to overlying bowel gas. No gross hydronephrosis or obvious mass. Abdominal  aorta: Not well visualized. The aneurysm noted on the current CT is not appreciated sonographically. Maximum AP diameter is 2 cm. Other findings: Right pleural effusion. IMPRESSION: 1. No acute findings. No sonographic evidence of acute cholecystitis. 2. Multiple gallstones. 3. Normal sonographic appearance of the liver. 4. Pancreas mostly obscured by bowel gas. Left kidneys suboptimally assessed. 5. Known infrarenal abdominal aortic aneurysm not visualized sonographically. 6. Right pleural effusion. Electronically Signed   By: Amie Portland M.D.   On: 05/31/2017 15:00   Dg Chest Port 1 View  Result Date: 05/31/2017 CLINICAL DATA:  PICC placement. EXAM: PORTABLE CHEST 1 VIEW COMPARISON:  Frontal and lateral views yesterday. FINDINGS: Right upper extremity PICC with tip in the distal SVC. Cardiomegaly is similar allowing for differences in technique. Atherosclerosis of the thoracic aorta. Development of peribronchial thickening, fluid in the minor fissure and small right pleural effusion from prior exam. Subsegmental atelectasis or scarring at the left lung base. No pneumothorax. IMPRESSION: 1. Right upper extremity PICC tip in the distal SVC. No pneumothorax. 2. Development of pulmonary edema and right pleural effusion since yesterday, suspicious for CHF. Cardiomegaly is grossly stable. Electronically Signed   By: Rubye Oaks M.D.   On: 05/31/2017 21:43    Haydee Salter, MD [facility] Pager AMION  If 7PM-7AM, please contact night-coverage [email or web address] Password [email or web address] 06/02/2017, 5:35 PM   LOS: 2 days

## 2017-06-02 NOTE — Progress Notes (Signed)
PROGRESS NOTE  Charlotte Salas JYN:[id number] DOB: [birth year] DOA: 05/30/2017 PCP: Stoney BangWinikur, Melinda, FNP  Brief History:  71 year old female with a history of coronary artery disease, systolic CHF with EF 30-35%, hypertension, diabetes mellitus, postoperative atrial fibrillation, peripheral arterial disease, hyperlipidemia presented with 2-day history of shortness of breath, orthopnea, lower extremity edema, and increasing abdominal girth.  Patient was recently discharged from the hospital in [place], [place] where she was admitted for approximately 3 days for CHF exacerbation.  The patient was discharged home with furosemide 40 mg po twice daily.  She states she was compliant with her medications as well as her diet and fluid restrictions.  The patient stated that she weighed herself, and her last weight on the morning of May 30, 2017 was 150 pounds.  Upon presentation, the patient was noted to have serum creatinine 1.69.  Chest x-ray showed bibasilar scarring.  BNP was 2126.  Troponin was 0.06.  The patient was started on intravenous furosemide.  Pt states she has not taken Entresto beyond September 2018 due to cost.  Assessment/Plan: Acute on chronic systolic CHF -February 12, 2017 echo EF 30-35%, diffuse HK, moderate MR/TR, PAS P 45 -Despite moderate TR contributing to JVD, the patient remains clinically fluid overloaded -Continue intravenous furosemide--> increase to 40 mg twice daily -Consult cardiology recs pending -February 19, 2017 discharge weight 146 pounds March 01, 2017 office weight-134 pounds -Daily weights -Strict I's and O's  Elevated troponin -Secondary to AKI and CHF exacerbation -No chest pain -Personally reviewed EKG--LBBB, sinus rhythm -Trend is flat  Acute on chronic renal failure--CKD3 -Baseline creatinine 0.9-1.2 -Present creatinine 1.67 -am BMP  Transaminasemia -likely hepatic congestion from CHF -RUQ US-->gallstonesm nl liver -am  LFTs  Essential hypertension -Cont decreased carvedilol secondary to soft blood pressure  Tobacco abuse/COPD -Patient has over 40-pack-year history -Has not had a cigarette in over 1 week -Tobacco cessation discussed  Diabetes mellitus -Holding metformin--likely will not restarted given the patient's worsening renal function -NovoLog sliding scale for now -check A1C  Postoperative atrial fibrillation -Presently in sinus rhythm  Coronary artery disease -Continue aspirin and statin -BMS to LAD 2009 -No chest pain presently   Disposition Plan:   Home in 2-3 days  Family Communication:  No  Family at bedside  Consultants:  cardiology  Code Status:  FULL   DVT Prophylaxis:  [place] Lovenox   Procedures: As Listed in Progress Note Above  Antibiotics: None    Subjective: Patient is breathing better this morning, but she remains dyspneic with exertion and lying flat.  She denies any fevers, chills, chest discomfort, nausea, vomiting, diarrhea, abdominal pain.  No dysuria hematuria.  No hematochezia melena.  Objective:  06/01/17 2131 06/01/17 2235  BP: 121/88 (!) 95/52  Pulse: 95 87  Resp: 18 18  Temp: 99 F (37.2 C) 98.8 F (37.1 C)  TempSrc: Oral Oral  SpO2: 98% 98%  Weight:  65 kg (143 lb 4.8 oz)  Height:  5' (1.524 m)    Intake/Output Summary (Last 24 hours) at 06/02/2017 1740 Last data filed at 06/02/2017 1722 Gross per 24 hour  Intake 720 ml  Output 2490 ml  Net -1770 ml   Weight change: -2.519 kg (-8.9 oz) Exam:   General:  Pt is alert, follows commands appropriately, not in acute distress  HEENT: No icterus, No thrush, No neck mass, [place]/AT  Cardiovascular: RRR, S1/S2, no rubs, no gallops +JVD  Respiratory: Bibasilar crackle; clear to auscultation  on the left.  No wheezing  Abdomen: Soft/+BS, non tender, non distended, no guarding  Extremities: 1+ LE edema, No lymphangitis, No petechiae, No rashes, no synovitis   Data Reviewed: I have  personally reviewed following labs and imaging studies Basic Metabolic Panel: Lab 05/30/17 1744 05/31/17 0546 06/01/17 0416 06/01/17 0738  NA 137 134* 137  --   K 4.7 4.1 3.6  --   CL 95* 93* 95*  --   CO2 27 28 30   --   GLUCOSE 145* 230* 148*  --   BUN 55* 56* 55*  --   CREATININE 1.69* 1.68* 1.67*  --   CALCIUM 9.1 8.6* 8.6*  --   MG  --   --   --  2.2   Liver Function Tests: Recent Labs  Lab 05/30/17 1744 05/31/17 0546 06/01/17 0416  AST 304* 244* 187*  ALT 431* 358* 298*  ALKPHOS 177* 163* 143*  BILITOT 1.1 1.1 1.0  PROT 6.5 5.9* 5.6*  ALBUMIN 3.2* 2.9* 2.7*   No results for input(s): LIPASE, AMYLASE in the last 168 hours. No results for input(s): AMMONIA in the last 168 hours. Coagulation Profile: No results for input(s): INR, PROTIME in the last 168 hours. CBC: Lab 05/30/17 1744 05/31/17 0546  WBC 10.7* 10.3  NEUTROABS  --   --   HGB 12.7 12.4  HCT 40.4 39.9  MCV 85.2 85.8  PLT 192 153   Cardiac Enzymes: Recent Labs  Lab 05/30/17 1744 05/31/17 0028 05/31/17 0546  TROPONINI 0.06* 0.05* 0.05*   BNP: Invalid input(s): POCBNP CBG: Lab 06/01/17 1632 06/01/17 2123  GLUCAP 181* 199*   HbA1C: Recent Labs    05/30/17 1744 06/01/17 0416  HGBA1C 7.8* 7.9*   Urine analysis:    Component Value Date/Time   COLORURINE YELLOW 02/07/2017 1153   APPEARANCEUR CLEAR 02/07/2017 1153   LABSPEC 1.008 02/07/2017 1153   PHURINE 5.0 02/07/2017 1153   GLUCOSEU NEGATIVE 02/07/2017 1153   HGBUR NEGATIVE 02/07/2017 1153   BILIRUBINUR NEGATIVE 02/07/2017 1153   KETONESUR NEGATIVE 02/07/2017 1153   PROTEINUR NEGATIVE 02/07/2017 1153   NITRITE NEGATIVE 02/07/2017 1153   LEUKOCYTESUR NEGATIVE 02/07/2017 1153   Sepsis Labs: @LABRCNTIP (procalcitonin:4,lacticidven:4) )No results found for this or any previous visit (from the past 240 hour(s)).   Scheduled Meds: . aspirin EC  81 mg Oral QPM  . digoxin  0.0625 mg Oral Daily  . enoxaparin (LOVENOX) injection   30 mg Subcutaneous Q24H  . furosemide  80 mg Intravenous Q12H  . insulin aspart  0-15 Units Subcutaneous TID WC  . lisinopril  2.5 mg Oral QHS  . pregabalin  75 mg Oral QHS  . simvastatin  20 mg Oral q1800  . sodium chloride flush  3 mL Intravenous Q12H   Continuous Infusions: . sodium chloride      Procedures/Studies: Dg Chest 2 View  Result Date: 05/30/2017 CLINICAL DATA:  Shortness of breath EXAM: CHEST  2 VIEW COMPARISON:  02/14/2017 FINDINGS: Linear atelectasis or scarring at the bases. No acute consolidation or effusion. Stable cardiomegaly with aortic atherosclerosis. No pneumothorax. IMPRESSION: 1. Linear scarring or atelectasis at both lung bases. No focal pulmonary opacity is seen 2. Stable mild cardiomegaly. Electronically Signed   By: Jasmine PangKim  Fujinaga M.D.   On: 05/30/2017 17:42   [place] Abdomen Complete  Result Date: 05/31/2017 CLINICAL DATA:  Acute renal failure.  Elevated liver transaminases. EXAM: ABDOMEN ULTRASOUND COMPLETE COMPARISON:  CT, 01/24/2017 FINDINGS: Gallbladder: Multiple dependent gallstones, largest measuring 1.7 cm. Patient  is tender to transducer pressure over the gallbladder. Wall measures 2.6 mm in thickness. No pericholecystic fluid. Common bile duct: Diameter: 3 mm Liver: No focal lesion identified. Within normal limits in parenchymal echogenicity. Portal vein is patent on color Doppler imaging with normal direction of blood flow towards the liver. IVC: No abnormality visualized. Pancreas: Suboptimal visualization.  Cannot accurately assess. Spleen: Size and appearance within normal limits. Right Kidney: Length: 10.2 cm. Echogenicity within normal limits. No mass or hydronephrosis visualized. Left Kidney: Length: 8.3 cm. Normal overall parenchymal echogenicity, but not well visualized due to overlying bowel gas. No gross hydronephrosis or obvious mass. Abdominal aorta: Not well visualized. The aneurysm noted on the current CT is not appreciated sonographically.  Maximum AP diameter is 2 cm. Other findings: Right pleural effusion. IMPRESSION: 1. No acute findings. No sonographic evidence of acute cholecystitis. 2. Multiple gallstones. 3. Normal sonographic appearance of the liver. 4. Pancreas mostly obscured by bowel gas. Left kidneys suboptimally assessed. 5. Known infrarenal abdominal aortic aneurysm not visualized sonographically. 6. Right pleural effusion. Electronically Signed   By: Amie Portland M.D.   On: 05/31/2017 15:00   Dg Chest Port 1 View  Result Date: 05/31/2017 CLINICAL DATA:  PICC placement. EXAM: PORTABLE CHEST 1 VIEW COMPARISON:  Frontal and lateral views yesterday. FINDINGS: Right upper extremity PICC with tip in the distal SVC. Cardiomegaly is similar allowing for differences in technique. Atherosclerosis of the thoracic aorta. Development of peribronchial thickening, fluid in the minor fissure and small right pleural effusion from prior exam. Subsegmental atelectasis or scarring at the left lung base. No pneumothorax. IMPRESSION: 1. Right upper extremity PICC tip in the distal SVC. No pneumothorax. 2. Development of pulmonary edema and right pleural effusion since yesterday, suspicious for CHF. Cardiomegaly is grossly stable. Electronically Signed   By: Rubye Oaks M.D.   On: 05/31/2017 21:43    Haydee Salter, MD  [facility] Pager AMION  If 7PM-7AM, please contact night-coverage [email or web address] Password [email or web address] 06/02/2017, 5:40 PM   LOS: 2 days

## 2017-06-03 DIAGNOSIS — N179 Acute kidney failure, unspecified: Secondary | ICD-10-CM

## 2017-06-03 LAB — CBC WITH DIFFERENTIAL/PLATELET
BASOS ABS: 0 10*3/uL (ref 0.0–0.1)
BASOS PCT: 0 %
Eosinophils Absolute: 0.2 10*3/uL (ref 0.0–0.7)
Eosinophils Relative: 2 %
HEMATOCRIT: 36.4 % (ref 36.0–46.0)
HEMOGLOBIN: 11.5 g/dL — AB (ref 12.0–15.0)
LYMPHS PCT: 32 %
Lymphs Abs: 2.7 10*3/uL (ref 0.7–4.0)
MCH: 26.6 pg (ref 26.0–34.0)
MCHC: 31.6 g/dL (ref 30.0–36.0)
MCV: 84.1 fL (ref 78.0–100.0)
MONOS PCT: 11 %
Monocytes Absolute: 1 10*3/uL (ref 0.1–1.0)
NEUTROS ABS: 4.7 10*3/uL (ref 1.7–7.7)
NEUTROS PCT: 55 %
Platelets: 186 10*3/uL (ref 150–400)
RBC: 4.33 MIL/uL (ref 3.87–5.11)
RDW: 14.7 % (ref 11.5–15.5)
WBC: 8.6 10*3/uL (ref 4.0–10.5)

## 2017-06-03 LAB — COMPREHENSIVE METABOLIC PANEL
ALBUMIN: 2.7 g/dL — AB (ref 3.5–5.0)
ALK PHOS: 129 U/L — AB (ref 38–126)
ALT: 168 U/L — AB (ref 14–54)
AST: 72 U/L — ABNORMAL HIGH (ref 15–41)
Anion gap: 9 (ref 5–15)
BUN: 48 mg/dL — AB (ref 6–20)
CALCIUM: 8.3 mg/dL — AB (ref 8.9–10.3)
CO2: 32 mmol/L (ref 22–32)
CREATININE: 1.51 mg/dL — AB (ref 0.44–1.00)
Chloride: 95 mmol/L — ABNORMAL LOW (ref 101–111)
GFR calc Af Amer: 38 mL/min — ABNORMAL LOW (ref >60)
GFR calc non Af Amer: 33 mL/min — ABNORMAL LOW (ref >60)
GLUCOSE: 81 mg/dL (ref 65–99)
Potassium: 3.2 mmol/L — ABNORMAL LOW (ref 3.5–5.1)
SODIUM: 136 mmol/L (ref 135–145)
Total Bilirubin: 1.2 mg/dL (ref 0.3–1.2)
Total Protein: 5.6 g/dL — ABNORMAL LOW (ref 6.5–8.1)

## 2017-06-03 LAB — COOXEMETRY PANEL
CARBOXYHEMOGLOBIN: 1.2 % (ref 0.5–1.5)
METHEMOGLOBIN: 1.2 % (ref 0.0–1.5)
O2 Saturation: 78.9 %
Total hemoglobin: 11.7 g/dL — ABNORMAL LOW (ref 12.0–16.0)

## 2017-06-03 LAB — GLUCOSE, CAPILLARY
GLUCOSE-CAPILLARY: 99 mg/dL (ref 65–99)
GLUCOSE-CAPILLARY: 238 mg/dL — AB (ref 65–99)
Glucose-Capillary: 137 mg/dL — ABNORMAL HIGH (ref 65–99)
Glucose-Capillary: 212 mg/dL — ABNORMAL HIGH (ref 65–99)

## 2017-06-03 LAB — MAGNESIUM: Magnesium: 2.1 mg/dL (ref 1.7–2.4)

## 2017-06-03 MED ORDER — POTASSIUM CHLORIDE CRYS ER 20 MEQ PO TBCR: 40.0000 meq | EXTENDED_RELEASE_TABLET | Freq: Once | ORAL | Status: DC

## 2017-06-03 MED ORDER — POTASSIUM CHLORIDE CRYS ER 20 MEQ PO TBCR
40.0000 meq | EXTENDED_RELEASE_TABLET | Freq: Once | ORAL | Status: AC
  Administered 2017-06-03: 40 meq via ORAL
  Filled 2017-06-03: qty 2

## 2017-06-03 MED ORDER — SPIRONOLACTONE 12.5 MG HALF TABLET
12.5000 mg | ORAL_TABLET | Freq: Every day | ORAL | Status: DC
  Administered 2017-06-03 – 2017-06-05 (×3): 12.5 mg via ORAL
  Filled 2017-06-03 (×6): qty 1

## 2017-06-03 NOTE — Evaluation (Signed)
Physical Therapy Evaluation Patient Details Name: Charlotte Salas MRN: [id number] DOB: 04-30-1946 Today's Date: 06/03/2017   History of Present Illness  71 year old female with past medical history of coronary artery disease, chronic systolic CHF with previously known EF of 30-35%, hypertension, diabetes mellitus, peripheral artery disease presented with 2-day history of shortness of breath, orthopnea, lower extremity edema.  Patient [place] presented to the hospital in [place], [place] from here she was sent over here for further management.  Clinical Impression  Pt admitted with above diagnosis. Pt currently with functional limitations due to the deficits listed below (see PT Problem List). Pt was able to ambulate with RW with good safety awareness. Will have assist at d/c and agrees to use RW.  Will follow acutely.   Pt will benefit from skilled PT to increase their independence and safety with mobility to allow discharge to the venue listed below.    Follow Up Recommendations Home health PT;Supervision/Assistance - 24 hour    Equipment Recommendations  None recommended by PT    Recommendations for Other Services       Precautions / Restrictions Precautions Precautions: Fall Restrictions Weight Bearing Restrictions: No      Mobility  Bed Mobility Overal bed mobility: Independent                Transfers Overall transfer level: Independent                  Ambulation/Gait Ambulation/Gait assistance: Min guard Ambulation Distance (Feet): 150 Feet Assistive device: Rolling walker (2 wheeled) Gait Pattern/deviations: Step-through pattern;Decreased stride length   Gait velocity interpretation: Below normal speed for age/gender General Gait Details: With RW, pt safe and was able to ambulate without much assist, just guard without LOB.  Without RW, pt had one LOB needing steadying assist.  Pt aware to use RW at home.  She states she will stay with  daughter.  Stairs            Wheelchair Mobility    Modified Rankin (Stroke Patients Only)       Balance Overall balance assessment: Needs assistance Sitting-balance support: No upper extremity supported;Feet supported Sitting balance-Leahy Scale: Fair     Standing balance support: Bilateral upper extremity supported;During functional activity Standing balance-Leahy Scale: Poor Standing balance comment: relies on UE support                             Pertinent Vitals/Pain Pain Assessment: No/denies pain  VSS  Home Living Family/patient expects to be discharged to:: Private residence Living Arrangements: Alone Available Help at Discharge: Family;Available 24 hours/day Type of Home: House Home Access: Stairs to enter   [employer] CorporationEntrance Stairs-Number of Steps: 1 Home Layout: One level Home Equipment: Walker - 2 wheels;Cane - single point Additional Comments: Pt's daughter will stay with her at discharge     Prior Function Level of Independence: Independent         Comments: Pt reports she was fully independent PTA, including driving and community activiites      Hand Dominance   Dominant Hand: Right    Extremity/Trunk Assessment   Upper Extremity Assessment Upper Extremity Assessment: Defer to OT evaluation    Lower Extremity Assessment Lower Extremity Assessment: Overall WFL for tasks assessed    Cervical / Trunk Assessment Cervical / Trunk Assessment: Normal  Communication   Communication: No difficulties  Cognition Arousal/Alertness: Awake/alert Behavior During Therapy: WFL for tasks assessed/performed Overall Cognitive Status:  Within Functional Limits for tasks assessed                                        General Comments      Exercises General Exercises - Lower Extremity Ankle Circles/Pumps: AROM;Both;10 reps;Supine Long Arc Quad: AROM;Both;10 reps;Seated   Assessment/Plan    PT Assessment Patient needs  continued PT services  PT Problem List Decreased balance;Decreased activity tolerance;Decreased mobility;Decreased knowledge of use of DME;Decreased safety awareness;Decreased knowledge of precautions       PT Treatment Interventions DME instruction;Gait training;Functional mobility training;Therapeutic activities;Therapeutic exercise;Balance training;Patient/family education    PT Goals (Current goals can be found in the Care Plan section)  Acute Rehab PT Goals Patient Stated Goal: to go home PT Goal Formulation: With patient Time For Goal Achievement: 06/17/17 Potential to Achieve Goals: Good    Frequency Min 3X/week   Barriers to discharge        Co-evaluation               AM-PAC PT "6 Clicks" Daily Activity  Outcome Measure Difficulty turning over in bed (including adjusting bedclothes, sheets and blankets)?: None Difficulty moving from lying on back to sitting on the side of the bed? : None Difficulty sitting down on and standing up from a chair with arms (e.g., wheelchair, bedside commode, etc,.)?: None Help needed moving to and from a bed to chair (including a wheelchair)?: A Little Help needed walking in hospital room?: A Lot Help needed climbing 3-5 steps with a railing? : A Little 6 Click Score: 20    End of Session Equipment Utilized During Treatment: Gait belt Activity Tolerance: Patient limited by fatigue Patient left: in chair;with call bell/phone within reach;with chair alarm set Nurse Communication: Mobility status PT Visit Diagnosis: Unsteadiness on feet (R26.81);Muscle weakness (generalized) (M62.81)    Time: [id number] PT Time Calculation (min) (ACUTE ONLY): 16 min   Charges:   PT Evaluation $PT Eval Moderate Complexity: 1 Mod     PT G Codes:        Danne Vasek,PT Acute Rehabilitation [phone] (pager)   Charlotte Salas 06/03/2017, 2:49 PM

## 2017-06-03 NOTE — Progress Notes (Signed)
[facility] PROGRESS NOTE  Riley Lammma J Gillaspie ZOX:[id number] DOB: Dec 27, 1945 DOA: 05/30/2017  PCP: Stoney BangWinikur, Melinda, FNP  Brief History/Interval Summary: 71 year old female with past medical history of coronary artery disease, chronic systolic CHF with previously known EF of 30-35%, hypertension, diabetes mellitus, peripheral artery disease presented with 2-day history of shortness of breath, orthopnea, lower extremity edema.  Patient initially presented to the hospital in [place], [place] from here she was sent over here for further management.  Reason for Visit: Acute systolic CHF  Consultants: Cardiology  Procedures:   Transthoracic echocardiogram Study Conclusions  - Left ventricle: The cavity size was mildly dilated. Wall   thickness was at the upper limits of normal. Systolic function   was severely reduced. The estimated ejection fraction was in the   range of 15% to 20%. Diffuse hypokinesis. There is akinesis of   the inferoseptal myocardium. The study is not technically   sufficient to allow evaluation of LV diastolic function. - Ventricular septum: Septal motion showed abnormal function and   dyssynergy. - Aortic valve: Moderately calcified annulus. Trileaflet. - Mitral valve: Mildly calcified annulus. There was moderate   regurgitation. - Left atrium: The atrium was severely dilated. - Right ventricle: The cavity size was mildly dilated. Systolic   function was moderately to severely reduced. - Right atrium: The atrium was mildly to moderately dilated.   Central venous pressure (est): 8 mm Hg. - Tricuspid valve: There was severe regurgitation. - Pulmonary arteries: PA peak pressure: 51 mm Hg (S). - Pericardium, extracardiac: There was no pericardial effusion.  Impressions:  - Upper normal LV wall thickness with mild chamber dilatation and   LVEF approximately 15-20%. There is diffuse hypokinesis with   regional variation and akinesis of the  inferoseptal wall and   septal dyssynergy. Indeterminate diastolic function, but appears   to be restrictive filling pattern. Severe left atrial   enlargement. Mildly calcified mitral annulus with at least   moderate mitral regurgitation. moderate aortic annular   calcification. Moderately dilated right ventricle with moderately   to severely reduced contraction. Mild to moderate right atrial   enlargement. Severe tricuspid regurgitation with PASP estimated   at 51 mmHg.  Antibiotics: None  Subjective/Interval History: Patient states that she is feeling better.  Her shortness of breath has improved.  Her leg swelling is improving.  Denies any nausea vomiting.  Denies any chest pain.  ROS: No headaches  Objective:  Vital Signs  Vitals:   06/02/17 1957 06/03/17 0404 06/03/17 0406 06/03/17 0957  BP: 117/64  124/67 (!) 105/49  Pulse: 78  76 77  Resp: 18  14 16   Temp: 97.6 F (36.4 C)  98.2 F (36.8 C)   TempSrc: Oral  Oral   SpO2: 100% 90% 98% 92%  Weight:   63.6 kg (140 lb 4.8 oz)   Height:        Intake/Output Summary (Last 24 hours) at 06/03/2017 1129 Last data filed at 06/03/2017 0900 Gross per 24 hour  Intake 600 ml  Output 2620 ml  Net -2020 ml   Filed Weights   06/01/17 2235 06/02/17 0512 06/03/17 0406  Weight: 65 kg (143 lb 4.8 oz) 64.2 kg (141 lb 8.6 oz) 63.6 kg (140 lb 4.8 oz)    General appearance: alert, cooperative, appears stated age and no distress Head: Normocephalic, without obvious abnormality, atraumatic Resp: Continues to have crackles at the bases.  No wheezing.  No rhonchi.  Normal effort at rest. Cardio: S1-S2 is normal and regular.  No S3-S4.  Systolic murmur appreciated over the precordium.  JVD is noted.  Pedal edema is present bilaterally. GI: soft, non-tender; bowel sounds normal; no masses,  no organomegaly Extremities: edema 1+ Pulses: 2+ and symmetric Neurologic: No focal deficits.  Lab Results:  Data Reviewed: I have personally  reviewed following labs and imaging studies  CBC: Recent Labs  Lab 05/30/17 1744 05/31/17 0546 06/02/17 0732 06/03/17 0451  WBC 10.7* 10.3 8.7 8.6  NEUTROABS  --   --  4.3 4.7  HGB 12.7 12.4 12.7 11.5*  HCT 40.4 39.9 39.9 36.4  MCV 85.2 85.8 84.9 84.1  PLT 192 153 192 186    Basic Metabolic Panel: Recent Labs  Lab 05/30/17 1744 05/31/17 0546 06/01/17 0416 06/01/17 0738 06/02/17 0732 06/03/17 0451  NA 137 134* 137  --  137 136  K 4.7 4.1 3.6  --  4.3 3.2*  CL 95* 93* 95*  --  95* 95*  CO2 27 28 30   --  32 32  GLUCOSE 145* 230* 148*  --  113* 81  BUN 55* 56* 55*  --  50* 48*  CREATININE 1.69* 1.68* 1.67*  --  1.73* 1.51*  CALCIUM 9.1 8.6* 8.6*  --  8.5* 8.3*  MG  --   --   --  2.2  --  2.1    GFR: Estimated Creatinine Clearance: 27.6 mL/min (A) (by C-G formula based on SCr of 1.51 mg/dL (H)).  Liver Function Tests: Recent Labs  Lab 05/30/17 1744 05/31/17 0546 06/01/17 0416 06/03/17 0451  AST 304* 244* 187* 72*  ALT 431* 358* 298* 168*  ALKPHOS 177* 163* 143* 129*  BILITOT 1.1 1.1 1.0 1.2  PROT 6.5 5.9* 5.6* 5.6*  ALBUMIN 3.2* 2.9* 2.7* 2.7*    Cardiac Enzymes: Recent Labs  Lab 05/30/17 1744 05/31/17 0028 05/31/17 0546  TROPONINI 0.06* 0.05* 0.05*    HbA1C: Recent Labs    06/01/17 0416  HGBA1C 7.9*    CBG: Recent Labs  Lab 06/02/17 1132 06/02/17 1644 06/02/17 2111 06/03/17 0747 06/03/17 1126  GLUCAP 181* 252* 124* 99 238*     Radiology Studies: No results found.   Medications:  Scheduled: . aspirin EC  81 mg Oral QPM  . digoxin  0.0625 mg Oral Daily  . enoxaparin (LOVENOX) injection  30 mg Subcutaneous Q24H  . furosemide  80 mg Intravenous Q12H  . insulin aspart  0-15 Units Subcutaneous TID WC  . lisinopril  2.5 mg Oral QHS  . potassium chloride  40 mEq Oral Once  . pregabalin  75 mg Oral QHS  . simvastatin  20 mg Oral q1800  . sodium chloride flush  3 mL Intravenous Q12H  . spironolactone  12.5 mg Oral Daily    Continuous: . sodium chloride     ZOX:WRUEAV chloride, acetaminophen **OR** acetaminophen, hydrALAZINE, ondansetron **OR** ondansetron (ZOFRAN) IV, sodium chloride flush, sodium chloride flush, traMADol  Assessment/Plan:  Active Problems:   Essential hypertension   CAD in native artery   Tobacco abuse   AAA (abdominal aortic aneurysm) (HCC)   CHF exacerbation (HCC)   Acute renal failure superimposed on stage 3 chronic kidney disease (HCC)   Transaminasemia   CHF (congestive heart failure) (HCC)    Acute on chronic systolic congestive heart failure Ejection fraction noted to be about 15-20% based on echocardiogram done during this hospitalization.  Heart failure team is following.  Patient is on intravenous furosemide.  She is diuresing well.  Weight is decreasing.  Continue strict  ins and outs and daily weights.  Patient is noted to be on ACE inhibitor.  Currently not on a beta-blocker.  Defer to cardiology.  Elevated troponin Thought to be secondary to CHF and acute kidney injury.  Patient did not have any chest pain.  Further management per cardiology.  Back in September a coronary angiogram was attempted but could not be done due to access issues.  Acute on chronic kidney disease stage III Baseline creatinine between 0.9-1.2.  Creatinine did go up as high as 1.7 but now seems to be improving.  She has good urine output.  Continue to monitor.  Transaminitis Most likely due to hepatic congestion from CHF.  LFTs have been improving.  Multiple gallstones noted on abdominal ultrasound without cholecystitis.  Liver was noted to be normal in appearance.  Check hepatitis panel.  Essential hypertension Continue to monitor blood pressures closely.  History of COPD/tobacco abuse Has not had a cigarette in over a week.  Tobacco cessation was discussed.  Diabetes mellitus type 2 Metformin has been held.  Continue sliding scale insulin coverage.  HbA1c 7.9.  Previous history of  postoperative atrial fibrillation Currently in sinus rhythm.  Not on anticoagulation.  Defer to cardiology.  History of coronary artery disease Continue aspirin and statin.  History of bare-metal stent to LAD in 2009.  DVT Prophylaxis: Lovenox    Code Status: DNR Family Communication: Discussed with the patient Disposition Plan: Management as outlined above.  PT evaluation.  Further management per cardiology.    LOS: 3 days   Osvaldo ShipperGokul Shlome Baldree  [facility] Pager [phone] 06/03/2017, 11:29 AM  If 7PM-7AM, please contact night-coverage at [email or web address], password [email or web address]

## 2017-06-03 NOTE — Care Management Important Message (Signed)
Important Message  Patient Details  Name: Charlotte Salas MRN: [id number] Date of Birth: 10-Feb-1946   Medicare Important Message Given:  Yes    Dorena BodoIris Billie Intriago 06/03/2017, 1:36 PM

## 2017-06-03 NOTE — Progress Notes (Signed)
Advanced Heart Failure Rounding Note  PCP:  Primary Cardiologist:   Subjective:     Yesterday diuresed with IV lasix. Weight down 1 pound. Creatinine coming down.   Feels less bloated but still weak. No orthopnea or PND. Very concerned about her own prgnosis.    Objective:   Weight Range: 140 lb 4.8 oz (63.6 kg) Body mass index is 27.4 kg/m.   Vital Signs:   Temp:  [97.6 F (36.4 C)-98.2 F (36.8 C)] 98.2 F (36.8 C) (12/21 0406) Pulse Rate:  [76-79] 76 (12/21 0406) Resp:  [14-18] 14 (12/21 0406) BP: (97-124)/(55-74) 124/67 (12/21 0406) SpO2:  [90 %-100 %] 98 % (12/21 0406) Weight:  [140 lb 4.8 oz (63.6 kg)] 140 lb 4.8 oz (63.6 kg) (12/21 0406) Last BM Date: 06/01/17  Weight change: Filed Weights   06/01/17 2235 06/02/17 0512 06/03/17 0406  Weight: 143 lb 4.8 oz (65 kg) 141 lb 8.6 oz (64.2 kg) 140 lb 4.8 oz (63.6 kg)    Intake/Output:   Intake/Output Summary (Last 24 hours) at 06/03/2017 0810 Last data filed at 06/03/2017 1610 Gross per 24 hour  Intake 480 ml  Output 2940 ml  Net -2460 ml      Physical Exam    General:   Sitting in bed No resp difficulty HEENT: Normal Neck: Supple. JVP  9-10 . Carotids 2+ bilat; no bruits. No lymphadenopathy or thyromegaly appreciated. Cor: PMI laterally displaced. Regular rate & rhythm. +s3 Lungs: Clear on 2 liters  Abdomen: Soft, nontender, nondistended. No hepatosplenomegaly. No bruits or masses. Good bowel sounds. Extremities: No cyanosis, clubbing, rash, R and LLE trace -1+ edema. RUE PICC  Neuro: Alert & orientedx3, cranial nerves grossly intact. moves all 4 extremities w/o difficulty. Affect pleasant   Telemetry   NSR 70-80s Personally reviewed    EKG    N/A   Labs    CBC Recent Labs    06/02/17 0732 06/03/17 0451  WBC 8.7 8.6  NEUTROABS 4.3 4.7  HGB 12.7 11.5*  HCT 39.9 36.4  MCV 84.9 84.1  PLT 192 186   Basic Metabolic Panel Recent Labs    96/04/54 0738 06/02/17 0732 06/03/17 0451    NA  --  137 136  K  --  4.3 3.2*  CL  --  95* 95*  CO2  --  32 32  GLUCOSE  --  113* 81  BUN  --  50* 48*  CREATININE  --  1.73* 1.51*  CALCIUM  --  8.5* 8.3*  MG 2.2  --  2.1   Liver Function Tests Recent Labs    06/01/17 0416 06/03/17 0451  AST 187* 72*  ALT 298* 168*  ALKPHOS 143* 129*  BILITOT 1.0 1.2  PROT 5.6* 5.6*  ALBUMIN 2.7* 2.7*   No results for input(s): LIPASE, AMYLASE in the last 72 hours. Cardiac Enzymes No results for input(s): CKTOTAL, CKMB, CKMBINDEX, TROPONINI in the last 72 hours.  BNP: BNP (last 3 results) Recent Labs    02/14/17 0948 05/30/17 1744  BNP 1,788.7* 2,426.0*    ProBNP (last 3 results) No results for input(s): PROBNP in the last 8760 hours.   D-Dimer No results for input(s): DDIMER in the last 72 hours. Hemoglobin A1C Recent Labs    06/01/17 0416  HGBA1C 7.9*   Fasting Lipid Panel No results for input(s): CHOL, HDL, LDLCALC, TRIG, CHOLHDL, LDLDIRECT in the last 72 hours. Thyroid Function Tests No results for input(s): TSH, T4TOTAL, T3FREE, THYROIDAB in the last  72 hours.  Invalid input(s): FREET3  Other results:   Imaging     No results found.   Medications:     Scheduled Medications: . aspirin EC  81 mg Oral QPM  . digoxin  0.0625 mg Oral Daily  . enoxaparin (LOVENOX) injection  30 mg Subcutaneous Q24H  . furosemide  80 mg Intravenous Q12H  . insulin aspart  0-15 Units Subcutaneous TID WC  . lisinopril  2.5 mg Oral QHS  . potassium chloride  40 mEq Oral Once  . pregabalin  75 mg Oral QHS  . simvastatin  20 mg Oral q1800  . sodium chloride flush  3 mL Intravenous Q12H     Infusions: . sodium chloride       PRN Medications:  sodium chloride, acetaminophen **OR** acetaminophen, hydrALAZINE, ondansetron **OR** ondansetron (ZOFRAN) IV, sodium chloride flush, sodium chloride flush, traMADol    Patient Profile   Charlotte Salas is a 71 y.o. female with PMH CAD (s/p stenting of RCA in 2001, BMS to  LAD in 2009 with total occlusion of previously placed RCA stent noted with collateral flow present), ICM, Systolic CHF, PAD s/p aortobifemoral bypass in 02/2017, post op afib, HTN, HLD, and AAA.   [facility] worsening dyspnea. Diuresed on IV lasix. Coox 54% on 06/01/17 [facility] and treatment by HF team.     Assessment/Plan   1. Acute on Chronic Combined Systolic and Diastolic CHF -> Biventricular failure by Echo.  - EF worsened to 10% from  EF of 30-35% by echo in 02/2017.  - Has had several admissions recently ([place]) - Volume status improving. Continue IV lasix 80 mg twice a day.   - Supp K. Add 12.5 mg spiro daily.  - - Hold BB . CO-OX 78%.    - ACE/ARB/ARNI held on admission with AKI.   2. CAD/Elevated Troponin  -s/p stenting of RCA in 2001, BMS to LAD in 2009 with total occlusion of previously placed RCA stent noted with collateral flow present. - Cyclic troponin values have been flat at 0.06 and 0.05. Most likely represents demand ischemia in the setting of CHF. Unable to get arterial access for cath 02/2017 and has not had ischemic work up in some time.  - Denies CP.  - Continue ASA, BB, and statin therapy.   3.PAD -s/p aortobifemoral bypass in 02/2017.  - Followed by vascular surgery.   4. Post-operative atrial fibrillation -After surgery in 02/2017. No recurrence noted.  - Not on anticoagulation due to the arrhythmia occurring in the post-operative setting. -Maintaing SR  5. AKI -Creatinine trending down 1.7>1.5.  - Had been taking lisinopril AND Entresto, but then STOPPED Entresto due to cost. Unwilling to restart  - Continue to follow closely.   6. Transaminitis - Abdominal USshowed known gallstones but no acute findings. LFTs trending down  7. NSVT - Goal K > 4.0 and Mg > 2.0.  Supp K.   Consult cardiac rehab.    Medication concerns reviewed with patient and pharmacy team.  Barriers identified:  No   Length of Stay: 3  Amy Clegg, NP  06/03/2017, 8:10 AM  Advanced Heart Failure Team Pager [phone] (M-F; 7a - 4p)  Please contact [facility] for night-coverage after hours (4p -7a ) and weekends on [email or web address]  Patient seen and examined with Tonye BecketAmy Clegg, NP. We discussed all aspects of the encounter. I agree with the assessment and plan as stated above.  She has had moderate diuresis with IV lasix. Volume status still elevated. Echo reviewed with her personally. EF 10% with severe RV dysfunction. Co-ox surprisingly well compensated despite severe biventricular dysfunction. We again discussed the possibility of progressive underlying CAD. She remains reluctant about cath. Renal function improving with diuresis. Tolerating lisinopril. Refuses rechallenge with Entresto. No b-blocker yet with low output.   She is trying to arrange a call for me with her daughter or granddaughter (who is an Charity fundraiserN) for tomorrow.   Total time spent 35 minutes. Over half that time spent discussing above.   Arvilla Meresaniel Makenzi Bannister, MD  7:52 PM

## 2017-06-03 NOTE — Care Management Note (Addendum)
Case Management Note  Patient Details  Name: Charlotte Salas MRN: [id number] Date of Birth: 1945/06/17  Subjective/Objective:      CHF             Action/Plan: Patient lives alone in [place]; PCP: Stoney BangWinikur, Melinda, FNP; has private insurance with Medicare; pharmacy of choice is is [facility]; DME - walker and a cane at home that she use as needed. Her neighbor assist her as needed at home; [facility]; [facility] offered, patient stated " I don't know what I want, my doctor said I was dying." Emotional support given.  Contact number Silvio PateShelia -daughter  [phone] - Attending MD/ HF Team, patient wants her daughter called and updated  Expected Discharge Date:   possibly 06/07/2017               Expected Discharge Plan:  Home w Home Health Services  Discharge planning Services  CM Consult  Choice offered to:  Patient  Status of Service:  In process, will continue to follow  [name], [name], RN,MHA,BSN [phone] 06/03/2017, 3:56 PM

## 2017-06-04 DIAGNOSIS — I480 Paroxysmal atrial fibrillation: Secondary | ICD-10-CM

## 2017-06-04 LAB — COMPREHENSIVE METABOLIC PANEL
ALBUMIN: 2.7 g/dL — AB (ref 3.5–5.0)
ALK PHOS: 127 U/L — AB (ref 38–126)
ALT: 138 U/L — AB (ref 14–54)
AST: 50 U/L — AB (ref 15–41)
Anion gap: 6 (ref 5–15)
BILIRUBIN TOTAL: 0.9 mg/dL (ref 0.3–1.2)
BUN: 40 mg/dL — AB (ref 6–20)
CALCIUM: 8.4 mg/dL — AB (ref 8.9–10.3)
CO2: 32 mmol/L (ref 22–32)
CREATININE: 1.44 mg/dL — AB (ref 0.44–1.00)
Chloride: 100 mmol/L — ABNORMAL LOW (ref 101–111)
GFR calc Af Amer: 41 mL/min — ABNORMAL LOW (ref >60)
GFR calc non Af Amer: 35 mL/min — ABNORMAL LOW (ref >60)
GLUCOSE: 132 mg/dL — AB (ref 65–99)
Potassium: 4.5 mmol/L (ref 3.5–5.1)
SODIUM: 138 mmol/L (ref 135–145)
TOTAL PROTEIN: 6 g/dL — AB (ref 6.5–8.1)

## 2017-06-04 LAB — GLUCOSE, CAPILLARY
GLUCOSE-CAPILLARY: 279 mg/dL — AB (ref 65–99)
GLUCOSE-CAPILLARY: 122 mg/dL — AB (ref 65–99)
Glucose-Capillary: 164 mg/dL — ABNORMAL HIGH (ref 65–99)
Glucose-Capillary: 98 mg/dL (ref 65–99)

## 2017-06-04 LAB — HEPARIN LEVEL (UNFRACTIONATED): HEPARIN UNFRACTIONATED: 0.57 [IU]/mL (ref 0.30–0.70)

## 2017-06-04 LAB — COOXEMETRY PANEL
Carboxyhemoglobin: 1.2 % (ref 0.5–1.5)
Methemoglobin: 1 % (ref 0.0–1.5)
O2 SAT: 98.8 %
Total hemoglobin: 13 g/dL (ref 12.0–16.0)

## 2017-06-04 MED ORDER — AMIODARONE HCL IN DEXTROSE 360-4.14 MG/200ML-% IV SOLN
60.0000 mg/h | INTRAVENOUS | Status: DC
  Administered 2017-06-04 (×2): 60 mg/h via INTRAVENOUS
  Filled 2017-06-04 (×2): qty 200

## 2017-06-04 MED ORDER — HEPARIN (PORCINE) IN NACL 100-0.45 UNIT/ML-% IJ SOLN
800.0000 [IU]/h | INTRAMUSCULAR | Status: DC
  Administered 2017-06-04 – 2017-06-05 (×2): 800 [IU]/h via INTRAVENOUS
  Filled 2017-06-04 (×2): qty 250

## 2017-06-04 MED ORDER — HEPARIN BOLUS VIA INFUSION
3000.0000 [IU] | Freq: Once | INTRAVENOUS | Status: AC
  Administered 2017-06-04: 3000 [IU] via INTRAVENOUS
  Filled 2017-06-04: qty 3000

## 2017-06-04 MED ORDER — AMIODARONE HCL IN DEXTROSE 360-4.14 MG/200ML-% IV SOLN
30.0000 mg/h | INTRAVENOUS | Status: DC
  Administered 2017-06-04 – 2017-06-06 (×3): 30 mg/h via INTRAVENOUS
  Filled 2017-06-04 (×3): qty 200

## 2017-06-04 MED ORDER — AMIODARONE LOAD VIA INFUSION
150.0000 mg | Freq: Once | INTRAVENOUS | Status: AC
  Administered 2017-06-04: 150 mg via INTRAVENOUS
  Filled 2017-06-04: qty 83.34

## 2017-06-04 NOTE — Progress Notes (Signed)
Advanced Heart Failure Rounding Note  PCP:  Primary Cardiologist:   Subjective:    Remains on IV lasix. Diuresed 7 pounds last night (down now total 19 pounds). Breathing better. However developed AF overnight.   Rates in 80s. Denies palpitations or dizziness. No CP  Co-ox 98% (inaccurate) Creatinine improving  Objective:   Weight Range: 60.3 kg (133 lb) Body mass index is 25.97 kg/m.   Vital Signs:   Temp:  [97.4 F (36.3 C)-98.1 F (36.7 C)] 97.4 F (36.3 C) (12/22 1222) Pulse Rate:  [82-92] 89 (12/22 1222) Resp:  [18-20] 20 (12/22 1222) BP: (101-122)/(58-79) 122/78 (12/22 1222) SpO2:  [93 %-99 %] 99 % (12/22 1222) Weight:  [60.3 kg (133 lb)] 60.3 kg (133 lb) (12/22 0500) Last BM Date: 06/04/17  Weight change: Filed Weights   06/02/17 0512 06/03/17 0406 06/04/17 0500  Weight: 64.2 kg (141 lb 8.6 oz) 63.6 kg (140 lb 4.8 oz) 60.3 kg (133 lb)    Intake/Output:   Intake/Output Summary (Last 24 hours) at 06/04/2017 1253 Last data filed at 06/04/2017 1004 Gross per 24 hour  Intake 600 ml  Output 3802 ml  Net -3202 ml      Physical Exam    General:  Lying in bed . No resp difficulty HEENT: normal Neck: supple. JVP 7. Carotids 2+ bilat; no bruits. No lymphadenopathy or thryomegaly appreciated. Cor: PMI laterally displaced. IRR +s3 Lungs: clear with decreased BS throughout Abdomen: soft, nontender, nondistended. No hepatosplenomegaly. No bruits or masses. Good bowel sounds. Extremities: no cyanosis, clubbing, rash, tr-1+edema Neuro: alert & orientedx3, cranial nerves grossly intact. moves all 4 extremities w/o difficulty. Affect pleasant    Telemetry   AF 80-90s Personally reviewed    EKG    N/A   Labs    CBC Recent Labs    06/02/17 0732 06/03/17 0451  WBC 8.7 8.6  NEUTROABS 4.3 4.7  HGB 12.7 11.5*  HCT 39.9 36.4  MCV 84.9 84.1  PLT 192 186   Basic Metabolic Panel Recent Labs    14/78/2912/21/18 0451 06/04/17 0518  NA 136 138  K 3.2*  4.5  CL 95* 100*  CO2 32 32  GLUCOSE 81 132*  BUN 48* 40*  CREATININE 1.51* 1.44*  CALCIUM 8.3* 8.4*  MG 2.1  --    Liver Function Tests Recent Labs    06/03/17 0451 06/04/17 0518  AST 72* 50*  ALT 168* 138*  ALKPHOS 129* 127*  BILITOT 1.2 0.9  PROT 5.6* 6.0*  ALBUMIN 2.7* 2.7*   No results for input(s): LIPASE, AMYLASE in the last 72 hours. Cardiac Enzymes No results for input(s): CKTOTAL, CKMB, CKMBINDEX, TROPONINI in the last 72 hours.  BNP: BNP (last 3 results) Recent Labs    02/14/17 0948 05/30/17 1744  BNP 1,788.7* 2,426.0*    ProBNP (last 3 results) No results for input(s): PROBNP in the last 8760 hours.   D-Dimer No results for input(s): DDIMER in the last 72 hours. Hemoglobin A1C No results for input(s): HGBA1C in the last 72 hours. Fasting Lipid Panel No results for input(s): CHOL, HDL, LDLCALC, TRIG, CHOLHDL, LDLDIRECT in the last 72 hours. Thyroid Function Tests No results for input(s): TSH, T4TOTAL, T3FREE, THYROIDAB in the last 72 hours.  Invalid input(s): FREET3  Other results:   Imaging    No results found.   Medications:     Scheduled Medications: . aspirin EC  81 mg Oral QPM  . digoxin  0.0625 mg Oral Daily  . enoxaparin (  LOVENOX) injection  30 mg Subcutaneous Q24H  . furosemide  80 mg Intravenous Q12H  . insulin aspart  0-15 Units Subcutaneous TID WC  . lisinopril  2.5 mg Oral QHS  . pregabalin  75 mg Oral QHS  . simvastatin  20 mg Oral q1800  . sodium chloride flush  3 mL Intravenous Q12H  . spironolactone  12.5 mg Oral Daily    Infusions: . sodium chloride      PRN Medications: sodium chloride, acetaminophen **OR** acetaminophen, hydrALAZINE, ondansetron **OR** ondansetron (ZOFRAN) IV, sodium chloride flush, sodium chloride flush, traMADol    Patient Profile   Charlotte Salas is a 71 y.o. female with PMH CAD (s/p stenting of RCA in 2001, BMS to LAD in 2009 with total occlusion of previously placed RCA stent noted  with collateral flow present), ICM, Systolic CHF, PAD s/p aortobifemoral bypass in 02/2017, post op afib, HTN, HLD, and AAA.   [facility] with worsening dyspnea. Diuresed on IV lasix. Coox 54% on 06/01/17 [facility] and treatment by HF team.     Assessment/Plan   1. Acute on Chronic Combined Systolic and Diastolic CHF -> Biventricular failure by Echo.  - EF worsened to 10% from  EF of 30-35% by echo in 02/2017. Unclear etiology. Now with biventricular dysfunction  - Volume status improving but still elevated.  Continue IV lasix 80 mg twice a day.  - Continue spiro 12.5 mg  daily.  - Off b-blocker due to acute decompensation..    - Continue lisinopril 2.5. Refuses Entresto - Long talk with her and multiple family members (including her daughter) about her situation and possible poor prognosis. We have decided to reattempt cath on Monday. Previously tried left radial approach in 9/18 -> at that time we cannulated left radial artery multiple times but unable to feed wire.  Will try right radial. If unable to gain access can stick aorto-bifem graft.   2. CAD/Elevated Troponin  -s/p stenting of RCA in 2001, BMS to LAD in 2009 with total occlusion of previously placed RCA stent noted with collateral flow present. - No s/s ischemia  - Continue ASA and statin therapy.  - Cath Monday  3.PAD -s/p aortobifemoral bypass in 02/2017.  - Followed by vascular surgery.   4. Paroxysmal atrial fibrillation -Had post-op AF after surgery in 02/2017.  - Now recurred overnight. Start amio and heparin. D/w PharmD  5. AKI -Creatinine trending down 1.7>1.5.> 1.44 - Had been taking lisinopril AND Entresto, but then STOPPED Entresto due to cost. Unwilling to restart  - Continue to follow closely.   6. Transaminitis - Abdominal USshowed known gallstones but no acute findings. LFTs trending down  7. NSVT - Goal K > 4.0 and Mg > 2.0.  Supp K.    Total time spent 45 minutes. Over half that time spent discussing above.    Length of Stay: 4  Arvilla Meresaniel Moani Weipert, MD  06/04/2017, 12:53 PM  Advanced Heart Failure Team Pager [phone] (M-F; 7a - 4p)  Please contact [facility] for night-coverage after hours (4p -7a ) and weekends on [email or web address]

## 2017-06-04 NOTE — Evaluation (Signed)
Occupational Therapy Evaluation Patient Details Name: Charlotte Salas MRN: [id number] DOB: 06/06/1946 Today's Date: 06/04/2017    History of Present Illness 71 year old female with past medical history of coronary artery disease, chronic systolic CHF with previously known EF of 30-35%, hypertension, diabetes mellitus, peripheral artery disease presented with 2-day history of shortness of breath, orthopnea, lower extremity edema.  Patient initially presented to the hospital in [place], [place] from here she was sent over here for further management.   Clinical Impression   Patient evaluated by Occupational Therapy with no further acute OT needs identified. All education has been completed and the patient has no further questions. Pt is able to perform ADLs and functional mobility at supervision level - DOE 2/4.  She was living alone [facility], but [facility]. All education completed.  See below for any follow-up Occupational Therapy or equipment needs. OT is signing off. Thank you for this referral.      Follow Up Recommendations  No OT follow up;Supervision/Assistance - 24 hour    Equipment Recommendations  Tub/shower seat    Recommendations for Other Services       Precautions / Restrictions Precautions Precautions: Fall Restrictions Weight Bearing Restrictions: No      Mobility Bed Mobility Overal bed mobility: Independent                Transfers Overall transfer level: Independent                    Balance Overall balance assessment: Needs assistance Sitting-balance support: No upper extremity supported;Feet supported Sitting balance-Leahy Scale: Good     Standing balance support: During functional activity;No upper extremity supported Standing balance-Leahy Scale: Fair                             ADL either performed or assessed with clinical judgement   ADL Overall ADL's : Needs  assistance/impaired Eating/Feeding: Independent   Grooming: Wash/dry hands;Wash/dry face;Oral care;Brushing hair;Supervision/safety;Standing   Upper Body Bathing: Set up;Sitting   Lower Body Bathing: Supervison/ safety;Sit to/from stand   Upper Body Dressing : Set up;Sitting   Lower Body Dressing: Supervision/safety;Sit to/from stand   Toilet Transfer: Supervision/safety;Ambulation;Comfort height toilet;Grab bars   Toileting- Clothing Manipulation and Hygiene: Supervision/safety;Sit to/from stand       Functional mobility during ADLs: Supervision/safety General ADL Comments: Pt fatigues more quickly than normal with activity      Vision         Perception     Praxis      Pertinent Vitals/Pain Pain Assessment: No/denies pain     Hand Dominance Right   Extremity/Trunk Assessment Upper Extremity Assessment Upper Extremity Assessment: Overall WFL for tasks assessed   Lower Extremity Assessment Lower Extremity Assessment: Defer to PT evaluation   Cervical / Trunk Assessment Cervical / Trunk Assessment: Normal   Communication Communication Communication: No difficulties   Cognition Arousal/Alertness: Awake/alert Behavior During Therapy: WFL for tasks assessed/performed Overall Cognitive Status: Within Functional Limits for tasks assessed                                     General Comments  DOE 2/4 with activity     Exercises     Shoulder Instructions      Home Living Family/patient expects to be discharged to:: Private residence Living Arrangements: Alone  Available Help at Discharge: Family;Available 24 hours/day Type of Home: House Home Access: Stairs to enter Enter[employer] CorporationEntrance Stairs-Number of Steps: 1   Home Layout: One level     Bathroom Shower/Tub: Tub/shower unit         Home Equipment: Environmental consultantWalker - 2 wheels;Cane - single point   Additional Comments: Pt reports [facility] with daughter at discharge        Prior Functioning/Environment Level of Independence: Independent        Comments: Pt reports she was mod I, but status has fluctuated since Sept.   She reports she had a progressive decline in funtion over 1-2 weeks that preceeded this admission         OT Problem List: Decreased activity tolerance      OT Treatment/Interventions:      OT Goals(Current goals can be found in the care plan section) Acute Rehab OT Goals Patient Stated Goal: to go home OT Goal Formulation: All assessment and education complete, DC therapy  OT Frequency:     Barriers to D/C:            Co-evaluation              AM-PAC PT "6 Clicks" Daily Activity     Outcome Measure Help from another person eating meals?: None Help from another person taking care of personal grooming?: A Little Help from another person toileting, which includes using toliet, bedpan, or urinal?: A Little Help from another person bathing (including washing, rinsing, drying)?: A Little Help from another person to put on and taking off regular upper body clothing?: A Little Help from another person to put on and taking off regular lower body clothing?: A Little 6 Click Score: 19   End of Session Nurse Communication: Mobility status  Activity Tolerance: Patient tolerated treatment well Patient left: in chair;with call bell/phone within reach  OT Visit Diagnosis: Unsteadiness on feet (R26.81)                Time: [id number] OT Time Calculation (min): 37 min Charges:  OT General Charges $OT Visit: 1 Visit OT Evaluation $OT Eval Moderate Complexity: 1 Mod OT Treatments $Self Care/Home Management : 8-22 mins G-Codes:     Reynolds AmericanWendi Dovber Ernest, OTR/L [phone]   Maceo Hernan M 06/04/2017, 1:13 PM

## 2017-06-04 NOTE — Progress Notes (Signed)
[facility] PROGRESS NOTE  Charlotte Salas ZOX:[id number] DOB: 1946/02/21 DOA: 05/30/2017  PCP: Stoney BangWinikur, Melinda, FNP  Brief History/Interval Summary: 71 year old female with past medical history of coronary artery disease, chronic systolic CHF with previously known EF of 30-35%, hypertension, diabetes mellitus, peripheral artery disease presented with 2-day history of shortness of breath, orthopnea, lower extremity edema.  Patient [place] to the [place] from here she was sent over here for further management.  Patient seen by heart failure team.  She is getting diuresed.  She is improving slowly.  Reason for Visit: Acute systolic CHF  Consultants: Cardiology  Procedures:   Transthoracic echocardiogram Study Conclusions  - Left ventricle: The cavity size was mildly dilated. Wall   thickness was at the upper limits of normal. Systolic function   was severely reduced. The estimated ejection fraction was in the   range of 15% to 20%. Diffuse hypokinesis. There is akinesis of   the inferoseptal myocardium. The study is not technically   sufficient to allow evaluation of LV diastolic function. - Ventricular septum: Septal motion showed abnormal function and   dyssynergy. - Aortic valve: Moderately calcified annulus. Trileaflet. - Mitral valve: Mildly calcified annulus. There was moderate   regurgitation. - Left atrium: The atrium was severely dilated. - Right ventricle: The cavity size was mildly dilated. Systolic   function was moderately to severely reduced. - Right atrium: The atrium was mildly to moderately dilated.   Central venous pressure (est): 8 mm Hg. - Tricuspid valve: There was severe regurgitation. - Pulmonary arteries: PA peak pressure: 51 mm Hg (S). - Pericardium, extracardiac: There was no pericardial effusion.  Impressions:  - Upper normal LV wall thickness with mild chamber dilatation and   LVEF approximately  15-20%. There is diffuse hypokinesis with   regional variation and akinesis of the inferoseptal wall and   septal dyssynergy. Indeterminate diastolic function, but appears   to be restrictive filling pattern. Severe left atrial   enlargement. Mildly calcified mitral annulus with at least   moderate mitral regurgitation. moderate aortic annular   calcification. Moderately dilated right ventricle with moderately   to severely reduced contraction. Mild to moderate right atrial   enlargement. Severe tricuspid regurgitation with PASP estimated   at 51 mmHg.  Antibiotics: None  Subjective/Interval History: Patient states that she is feeling better.  Her breathing is improving.  She is able to lay flat on the bed.  Leg swelling is improving.  Denies any nausea or vomiting.  ROS: Denies any chest pain.  Objective:  Vital Signs  Vitals:   06/03/17 0957 06/03/17 1230 06/03/17 2046 06/04/17 0500  BP: (!) 105/49 119/68 115/79 (!) 101/58  Pulse: 77 76 92 82  Resp: 16 18 18 18   Temp:  (!) 97.5 F (36.4 C) (!) 97.5 F (36.4 C) 98.1 F (36.7 C)  TempSrc:  Oral Oral Oral  SpO2: 92% 96% 93% 99%  Weight:    60.3 kg (133 lb)  Height:        Intake/Output Summary (Last 24 hours) at 06/04/2017 0825 Last data filed at 06/04/2017 0825 Gross per 24 hour  Intake 720 ml  Output 4452 ml  Net -3732 ml   Filed Weights   06/02/17 0512 06/03/17 0406 06/04/17 0500  Weight: 64.2 kg (141 lb 8.6 oz) 63.6 kg (140 lb 4.8 oz) 60.3 kg (133 lb)    General appearance: Awake alert.  In no distress. Resp: Improved air entry bilaterally.  Few crackles at  the bases.  No wheezing or rhonchi.  Normal effort.   Cardio: 1 S2 is normal regular.  No S3-S4.  Systolic murmur appreciated over the precordium.  Pedal edema is improving.   GI: Abdomen remains soft.  Nontender.  Nondistended.  Bowel sounds are present.  No masses organomegaly. Extremities: Improving edema Neurologic: No focal deficits.  Lab  Results:  Data Reviewed: I have personally reviewed following labs and imaging studies  CBC: Recent Labs  Lab 05/30/17 1744 05/31/17 0546 06/02/17 0732 06/03/17 0451  WBC 10.7* 10.3 8.7 8.6  NEUTROABS  --   --  4.3 4.7  HGB 12.7 12.4 12.7 11.5*  HCT 40.4 39.9 39.9 36.4  MCV 85.2 85.8 84.9 84.1  PLT 192 153 192 186    Basic Metabolic Panel: Recent Labs  Lab 05/31/17 0546 06/01/17 0416 06/01/17 0738 06/02/17 0732 06/03/17 0451 06/04/17 0518  NA 134* 137  --  137 136 138  K 4.1 3.6  --  4.3 3.2* 4.5  CL 93* 95*  --  95* 95* 100*  CO2 28 30  --  32 32 32  GLUCOSE 230* 148*  --  113* 81 132*  BUN 56* 55*  --  50* 48* 40*  CREATININE 1.68* 1.67*  --  1.73* 1.51* 1.44*  CALCIUM 8.6* 8.6*  --  8.5* 8.3* 8.4*  MG  --   --  2.2  --  2.1  --     GFR: Estimated Creatinine Clearance: 28.2 mL/min (A) (by C-G formula based on SCr of 1.44 mg/dL (H)).  Liver Function Tests: Recent Labs  Lab 05/30/17 1744 05/31/17 0546 06/01/17 0416 06/03/17 0451 06/04/17 0518  AST 304* 244* 187* 72* 50*  ALT 431* 358* 298* 168* 138*  ALKPHOS 177* 163* 143* 129* 127*  BILITOT 1.1 1.1 1.0 1.2 0.9  PROT 6.5 5.9* 5.6* 5.6* 6.0*  ALBUMIN 3.2* 2.9* 2.7* 2.7* 2.7*    Cardiac Enzymes: Recent Labs  Lab 05/30/17 1744 05/31/17 0028 05/31/17 0546  TROPONINI 0.06* 0.05* 0.05*    HbA1C: No results for input(s): HGBA1C in the last 72 hours.  CBG: Recent Labs  Lab 06/03/17 0747 06/03/17 1126 06/03/17 1656 06/03/17 2049 06/04/17 0742  GLUCAP 99 238* 137* 212* 164*     Radiology Studies: No results found.   Medications:  Scheduled: . aspirin EC  81 mg Oral QPM  . digoxin  0.0625 mg Oral Daily  . enoxaparin (LOVENOX) injection  30 mg Subcutaneous Q24H  . furosemide  80 mg Intravenous Q12H  . insulin aspart  0-15 Units Subcutaneous TID WC  . lisinopril  2.5 mg Oral QHS  . pregabalin  75 mg Oral QHS  . simvastatin  20 mg Oral q1800  . sodium chloride flush  3 mL Intravenous  Q12H  . spironolactone  12.5 mg Oral Daily   Continuous: . sodium chloride     WUJ:WJXBJY chloride, acetaminophen **OR** acetaminophen, hydrALAZINE, ondansetron **OR** ondansetron (ZOFRAN) IV, sodium chloride flush, sodium chloride flush, traMADol  Assessment/Plan:  Active Problems:   Essential hypertension   CAD in native artery   Tobacco abuse   AAA (abdominal aortic aneurysm) (HCC)   CHF exacerbation (HCC)   Acute renal failure superimposed on stage 3 chronic kidney disease (HCC)   Transaminasemia   CHF (congestive heart failure) (HCC)   AKI (acute kidney injury) (HCC)    Acute on chronic systolic congestive heart failure Ejection fraction noted to be about 15-20% based on echocardiogram done during this hospitalization.  Heart  failure team is following.  Patient remains on intravenous furosemide.  She is diuresing well.  Weight is decreasing.  Continue strict ins and outs and daily weights.  Patient is noted to be on ACE inhibitor.  Currently not on a beta-blocker.  Defer to cardiology.  Elevated troponin Thought to be secondary to CHF and acute kidney injury.  Patient did not have any chest pain.  Further management per cardiology.  Back in September a coronary angiogram was attempted but could not be done due to access issues.  Acute on chronic kidney disease stage III Baseline creatinine between 0.9-1.2.  Creatinine did go up as high as 1.7.  Creatinine continues to improve.  She has good urine output.  Continue to monitor.   Transaminitis Most likely due to hepatic congestion from CHF. LFT's are gradually improving.  Multiple gallstones noted on abdominal ultrasound without cholecystitis.  Liver was noted to be normal in appearance.  Hepatitis panel is pending.    Essential hypertension Blood pressure is reasonably well controlled.  Somewhat borderline low.  Continue to monitor closely.    History of COPD/tobacco abuse Seems to be stable.  She has not had a cigarette in  over a week.  Tobacco cessation was discussed.  Diabetes mellitus type 2 Metformin has been held.  Continue sliding scale insulin coverage.  HbA1c 7.9.  Previous history of postoperative atrial fibrillation Currently in sinus rhythm.  Not on anticoagulation.  Defer to cardiology.  History of coronary artery disease Continue aspirin and statin.  History of bare-metal stent to LAD in 2009.  DVT Prophylaxis: Lovenox    Code Status: DNR Family Communication: Discussed with the patient Disposition Plan: Further management per cardiology.  Seen by physical therapy and home health has been recommended.    LOS: 4 days   Osvaldo ShipperGokul Aurielle Slingerland  [facility] Pager [phone] 06/04/2017, 8:25 AM  If 7PM-7AM, please contact night-coverage at [email or web address], password [email or web address]

## 2017-06-04 NOTE — Progress Notes (Signed)
ANTICOAGULATION CONSULT NOTE   Pharmacy Consult for Heparin Indication: atrial fibrillation  Allergies  Allergen Reactions  . Codeine Nausea Only    Patient Measurements: Height: 5' (152.4 cm) Weight: 133 lb (60.3 kg) IBW/kg (Calculated) : 45.5 Heparin Dosing Weight: 60kg   Vital Signs: Temp: 98.2 F (36.8 C) (12/22 1950) Temp Source: Oral (12/22 1950) BP: 132/63 (12/22 1950) Pulse Rate: 99 (12/22 1950)  Labs: Recent Labs    06/02/17 0732 06/03/17 0451 06/04/17 0518 06/04/17 2242  HGB 12.7 11.5*  --   --   HCT 39.9 36.4  --   --   PLT 192 186  --   --   HEPARINUNFRC  --   --   --  0.57  CREATININE 1.73* 1.51* 1.44*  --     Estimated Creatinine Clearance: 28.2 mL/min (A) (by C-G formula based on SCr of 1.44 mg/dL (H)).   Medical History: Past Medical History:  Diagnosis Date  . Atrial septal defect    hx of it  . CAD (coronary artery disease)    s/p prior stenting of the right coronary artery in '01 and non-ST-elevation myocardial infarction in jan '09, with a new bare-metal stent placed in the ostium of the left anterior descending coronary artert and the coronary anatomy as described above with total occlusion of the previously placed right coronary artery stent. EF 35-40%  . CHF (congestive heart failure) (HCC)   . Diabetes mellitus without complication (HCC)   . Hepatitis 1971   " A"  . History of cervical cancer   . History of non-Hodgkin's lymphoma   . HTN (hypertension)   . HTN (hypertension)   . Hyperlipidemia   . Ischemic cardiomyopathy    EF 35-40% by 01/2016 echo  . Myocardial infarction (HCC) 2009, 2000  . Other and unspecified hyperlipidemia   . Peripheral vascular disease (HCC)   . Personal history of malignant neoplasm of cervix uteri   . Personal history of other lymphatic and hematopoietic neoplasm 2002   hodgkins lymphoma   Assessment: 71 year old female now with afib overnight. Rate 60-80s. Patient not on anticoagulation prior to  admit. Orders to start heparin and amiodarone this afternoon.  12/22 PM: initial heparin level is therapeutic   Goal of Therapy:  Heparin level 0.3-0.7 units/ml Monitor platelets by anticoagulation protocol: Yes   Plan:  Cont heparin at 800 units/hr Confirmatory heparin level with AM labs  Abran DukeJames Takiah Maiden, PharmD, BCPS Clinical Pharmacist Phone: [phone]

## 2017-06-04 NOTE — Progress Notes (Signed)
ANTICOAGULATION CONSULT NOTE - Initial Consult  Pharmacy Consult for heparin Indication: atrial fibrillation  Allergies  Allergen Reactions  . Codeine Nausea Only    Patient Measurements: Height: 5' (152.4 cm) Weight: 133 lb (60.3 kg) IBW/kg (Calculated) : 45.5 Heparin Dosing Weight: 60kg   Vital Signs: Temp: 97.4 F (36.3 C) (12/22 1222) Temp Source: Oral (12/22 1222) BP: 122/78 (12/22 1222) Pulse Rate: 89 (12/22 1222)  Labs: Recent Labs    06/02/17 0732 06/03/17 0451 06/04/17 0518  HGB 12.7 11.5*  --   HCT 39.9 36.4  --   PLT 192 186  --   CREATININE 1.73* 1.51* 1.44*    Estimated Creatinine Clearance: 28.2 mL/min (A) (by C-G formula based on SCr of 1.44 mg/dL (H)).   Medical History: Past Medical History:  Diagnosis Date  . Atrial septal defect    hx of it  . CAD (coronary artery disease)    s/p prior stenting of the right coronary artery in '01 and non-ST-elevation myocardial infarction in jan '09, with a new bare-metal stent placed in the ostium of the left anterior descending coronary artert and the coronary anatomy as described above with total occlusion of the previously placed right coronary artery stent. EF 35-40%  . CHF (congestive heart failure) (HCC)   . Diabetes mellitus without complication (HCC)   . Hepatitis 1971   " A"  . History of cervical cancer   . History of non-Hodgkin's lymphoma   . HTN (hypertension)   . HTN (hypertension)   . Hyperlipidemia   . Ischemic cardiomyopathy    EF 35-40% by 01/2016 echo  . Myocardial infarction (HCC) 2009, 2000  . Other and unspecified hyperlipidemia   . Peripheral vascular disease (HCC)   . Personal history of malignant neoplasm of cervix uteri   . Personal history of other lymphatic and hematopoietic neoplasm 2002   hodgkins lymphoma   Assessment: 71 year old female now with afib overnight. Rate 60-80s. Patient not on anticoagulation prior to admit. Orders to start heparin and amiodarone this  afternoon.  Goal of Therapy:  Heparin level 0.3-0.7 units/ml Monitor platelets by anticoagulation protocol: Yes   Plan:  Give 3500 units bolus x 1 Start heparin infusion at 800 units/hr Check anti-Xa level in 8 hours and daily while on heparin Continue to monitor H&H and platelets  Sheppard CoilFrank Wilson PharmD., BCPS Clinical Pharmacist 06/04/2017 2:22 PM

## 2017-06-05 LAB — HEPATITIS PANEL, ACUTE
HEP B C IGM: NEGATIVE
HEP B S AG: NEGATIVE
Hep A IgM: NEGATIVE

## 2017-06-05 LAB — COMPREHENSIVE METABOLIC PANEL
ALBUMIN: 2.8 g/dL — AB (ref 3.5–5.0)
ALT: 100 U/L — ABNORMAL HIGH (ref 14–54)
AST: 36 U/L (ref 15–41)
Alkaline Phosphatase: 110 U/L (ref 38–126)
Anion gap: 7 (ref 5–15)
BUN: 43 mg/dL — AB (ref 6–20)
CHLORIDE: 99 mmol/L — AB (ref 101–111)
CO2: 31 mmol/L (ref 22–32)
Calcium: 8.3 mg/dL — ABNORMAL LOW (ref 8.9–10.3)
Creatinine, Ser: 1.51 mg/dL — ABNORMAL HIGH (ref 0.44–1.00)
GFR calc Af Amer: 38 mL/min — ABNORMAL LOW (ref >60)
GFR, EST NON AFRICAN AMERICAN: 33 mL/min — AB (ref >60)
GLUCOSE: 116 mg/dL — AB (ref 65–99)
POTASSIUM: 3.9 mmol/L (ref 3.5–5.1)
SODIUM: 137 mmol/L (ref 135–145)
Total Bilirubin: 0.7 mg/dL (ref 0.3–1.2)
Total Protein: 5.8 g/dL — ABNORMAL LOW (ref 6.5–8.1)

## 2017-06-05 LAB — COOXEMETRY PANEL
CARBOXYHEMOGLOBIN: 1.1 % (ref 0.5–1.5)
METHEMOGLOBIN: 1 % (ref 0.0–1.5)
O2 Saturation: 79 %
Total hemoglobin: 12.3 g/dL (ref 12.0–16.0)

## 2017-06-05 LAB — GLUCOSE, CAPILLARY
GLUCOSE-CAPILLARY: 150 mg/dL — AB (ref 65–99)
Glucose-Capillary: 112 mg/dL — ABNORMAL HIGH (ref 65–99)
Glucose-Capillary: 242 mg/dL — ABNORMAL HIGH (ref 65–99)

## 2017-06-05 LAB — HEPARIN LEVEL (UNFRACTIONATED): Heparin Unfractionated: 0.65 IU/mL (ref 0.30–0.70)

## 2017-06-05 LAB — CBC
HEMATOCRIT: 38.4 % (ref 36.0–46.0)
Hemoglobin: 12 g/dL (ref 12.0–15.0)
MCH: 26.2 pg (ref 26.0–34.0)
MCHC: 31.3 g/dL (ref 30.0–36.0)
MCV: 83.8 fL (ref 78.0–100.0)
Platelets: 244 10*3/uL (ref 150–400)
RBC: 4.58 MIL/uL (ref 3.87–5.11)
RDW: 14.9 % (ref 11.5–15.5)
WBC: 9.8 10*3/uL (ref 4.0–10.5)

## 2017-06-05 MED ORDER — SODIUM CHLORIDE 0.9% FLUSH
3.0000 mL | Freq: Two times a day (BID) | INTRAVENOUS | Status: DC
  Administered 2017-06-05: 3 mL via INTRAVENOUS

## 2017-06-05 MED ORDER — SODIUM CHLORIDE 0.9 % IV SOLN: 250.0000 mL | INTRAVENOUS | Status: DC | PRN

## 2017-06-05 MED ORDER — ASPIRIN 81 MG PO CHEW
81.0000 mg | CHEWABLE_TABLET | ORAL | Status: AC
  Administered 2017-06-06: 81 mg via ORAL
  Filled 2017-06-05: qty 1

## 2017-06-05 MED ORDER — SODIUM CHLORIDE 0.9% FLUSH: 3.0000 mL | INTRAVENOUS | Status: DC | PRN

## 2017-06-05 MED ORDER — SODIUM CHLORIDE 0.9 % IV SOLN
INTRAVENOUS | Status: DC
  Administered 2017-06-06: 08:00:00 via INTRAVENOUS

## 2017-06-05 NOTE — Progress Notes (Signed)
[facility] PROGRESS NOTE  Charlotte Salas ZOX:[id number] DOB: July 17, 1945 DOA: 05/30/2017  PCP: Stoney Bang, FNP  Brief History/Interval Summary: 71 year old female with past medical history of coronary artery disease, chronic systolic CHF with previously known EF of 30-35%, hypertension, diabetes mellitus, peripheral artery disease presented with 2-day history of shortness of breath, orthopnea, lower extremity edema.  Patient initially presented to the hospital in [place], [place] from here she was sent over here for further management.  Patient seen by heart failure team.  She is getting diuresed.  She is improving slowly.  Reason for Visit: Acute systolic CHF.  Atrial fibrillation  Consultants: Cardiology  Procedures:   Transthoracic echocardiogram Study Conclusions  - Left ventricle: The cavity size was mildly dilated. Wall   thickness was at the upper limits of normal. Systolic function   was severely reduced. The estimated ejection fraction was in the   range of 15% to 20%. Diffuse hypokinesis. There is akinesis of   the inferoseptal myocardium. The study is not technically   sufficient to allow evaluation of LV diastolic function. - Ventricular septum: Septal motion showed abnormal function and   dyssynergy. - Aortic valve: Moderately calcified annulus. Trileaflet. - Mitral valve: Mildly calcified annulus. There was moderate   regurgitation. - Left atrium: The atrium was severely dilated. - Right ventricle: The cavity size was mildly dilated. Systolic   function was moderately to severely reduced. - Right atrium: The atrium was mildly to moderately dilated.   Central venous pressure (est): 8 mm Hg. - Tricuspid valve: There was severe regurgitation. - Pulmonary arteries: PA peak pressure: 51 mm Hg (S). - Pericardium, extracardiac: There was no pericardial effusion.  Impressions:  - Upper normal LV wall thickness with mild chamber dilatation and  LVEF approximately 15-20%. There is diffuse hypokinesis with   regional variation and akinesis of the inferoseptal wall and   septal dyssynergy. Indeterminate diastolic function, but appears   to be restrictive filling pattern. Severe left atrial   enlargement. Mildly calcified mitral annulus with at least   moderate mitral regurgitation. moderate aortic annular   calcification. Moderately dilated right ventricle with moderately   to severely reduced contraction. Mild to moderate right atrial   enlargement. Severe tricuspid regurgitation with PASP estimated   at 51 mmHg.  Antibiotics: None  Subjective/Interval History: Patient states that she had a restless night.  She blames it on some of these new medications that she has been started on.  Denied any chest pain, shortness of breath.  No nausea vomiting.  Overall she states that her breathing has improved.    ROS: Denies any headaches.  Objective:  Vital Signs  Vitals:   06/04/17 1222 06/04/17 1510 06/04/17 1950 06/05/17 0700  BP: 122/78 100/63 132/63 120/76  Pulse: 89 69 99 65  Resp: 20  20 20   Temp: (!) 97.4 F (36.3 C)  98.2 F (36.8 C) 97.7 F (36.5 C)  TempSrc: Oral  Oral Oral  SpO2: 99%  97% 99%  Weight:    58.5 kg (128 lb 14.4 oz)  Height:        Intake/Output Summary (Last 24 hours) at 06/05/2017 0825 Last data filed at 06/05/2017 0700 Gross per 24 hour  Intake 1267.4 ml  Output 2000 ml  Net -732.6 ml   Filed Weights   06/03/17 0406 06/04/17 0500 06/05/17 0700  Weight: 63.6 kg (140 lb 4.8 oz) 60.3 kg (133 lb) 58.5 kg (128 lb 14.4 oz)    General appearance: Awake alert.  In no distress. Resp: Continues to have improved air entry bilaterally.  Few crackles at the bases.  No wheezing or rhonchi.  Normal effort.   Cardio: S1-S2 is irregularly irregular.  Systolic murmur appreciated over the precordium.  No S3-S4. GI: Abdomen remains soft.  Nontender nondistended.  Bowel sounds are present.  No masses  organomegaly.  Extremities: Edema much improved. Neurologic: No focal deficits.  Lab Results:  Data Reviewed: I have personally reviewed following labs and imaging studies  CBC: Recent Labs  Lab 05/30/17 1744 05/31/17 0546 06/02/17 0732 06/03/17 0451 06/05/17 0332  WBC 10.7* 10.3 8.7 8.6 9.8  NEUTROABS  --   --  4.3 4.7  --   HGB 12.7 12.4 12.7 11.5* 12.0  HCT 40.4 39.9 39.9 36.4 38.4  MCV 85.2 85.8 84.9 84.1 83.8  PLT 192 153 192 186 244    Basic Metabolic Panel: Recent Labs  Lab 06/01/17 0416 06/01/17 0738 06/02/17 0732 06/03/17 0451 06/04/17 0518 06/05/17 0332  NA 137  --  137 136 138 137  K 3.6  --  4.3 3.2* 4.5 3.9  CL 95*  --  95* 95* 100* 99*  CO2 30  --  32 32 32 31  GLUCOSE 148*  --  113* 81 132* 116*  BUN 55*  --  50* 48* 40* 43*  CREATININE 1.67*  --  1.73* 1.51* 1.44* 1.51*  CALCIUM 8.6*  --  8.5* 8.3* 8.4* 8.3*  MG  --  2.2  --  2.1  --   --     GFR: Estimated Creatinine Clearance: 26.6 mL/min (A) (by C-G formula based on SCr of 1.51 mg/dL (H)).  Liver Function Tests: Recent Labs  Lab 05/31/17 0546 06/01/17 0416 06/03/17 0451 06/04/17 0518 06/05/17 0332  AST 244* 187* 72* 50* 36  ALT 358* 298* 168* 138* 100*  ALKPHOS 163* 143* 129* 127* 110  BILITOT 1.1 1.0 1.2 0.9 0.7  PROT 5.9* 5.6* 5.6* 6.0* 5.8*  ALBUMIN 2.9* 2.7* 2.7* 2.7* 2.8*    Cardiac Enzymes: Recent Labs  Lab 05/30/17 1744 05/31/17 0028 05/31/17 0546  TROPONINI 0.06* 0.05* 0.05*    HbA1C: No results for input(s): HGBA1C in the last 72 hours.  CBG: Recent Labs  Lab 06/04/17 0742 06/04/17 1117 06/04/17 1637 06/04/17 2052 06/05/17 0740  GLUCAP 164* 98 279* 122* 112*     Radiology Studies: No results found.   Medications:  Scheduled: . aspirin EC  81 mg Oral QPM  . digoxin  0.0625 mg Oral Daily  . furosemide  80 mg Intravenous Q12H  . insulin aspart  0-15 Units Subcutaneous TID WC  . lisinopril  2.5 mg Oral QHS  . pregabalin  75 mg Oral QHS  .  simvastatin  20 mg Oral q1800  . sodium chloride flush  3 mL Intravenous Q12H  . spironolactone  12.5 mg Oral Daily   Continuous: . sodium chloride    . amiodarone 30 mg/hr (06/04/17 2129)  . heparin 800 Units/hr (06/04/17 1455)   ZOX:WRUEAVPRN:sodium chloride, acetaminophen **OR** acetaminophen, hydrALAZINE, ondansetron **OR** ondansetron (ZOFRAN) IV, sodium chloride flush, sodium chloride flush, traMADol  Assessment/Plan:  Active Problems:   Essential hypertension   CAD in native artery   Tobacco abuse   AAA (abdominal aortic aneurysm) (HCC)   CHF exacerbation (HCC)   Acute renal failure superimposed on stage 3 chronic kidney disease (HCC)   Transaminasemia   CHF (congestive heart failure) (HCC)   AKI (acute kidney injury) (HCC)    Acute  on chronic systolic congestive heart failure Ejection fraction noted to be about 15-20% based on echocardiogram done during this hospitalization.  Heart failure team is following.  Patient remains on intravenous furosemide.  Patient is diuresing well.  Weight is decreasing.  Continue strict ins and outs daily weight.  Patient is noted to be on ACE inhibitor.  Not on a beta-blocker currently.  Cardiology is following closely.    Elevated troponin Thought to be secondary to CHF and acute kidney injury.  Patient did not have any chest pain.  Further management per cardiology.  Back in September a coronary angiogram was attempted but could not be done due to access issues.  It appears that another attempt will be made during this hospitalization.  Previous history of postoperative atrial fibrillation Had been in sinus rhythm but converted to atrial fibrillation during this hospitalization.  Started on amiodarone and IV heparin by cardiology.  Acute on chronic kidney disease stage III Baseline creatinine between 0.9-1.2.  Creatinine did go up as high as 1.7.  Creatinine has been improving.  Stable this morning.  Continue to monitor urine output.     Transaminitis Most likely due to hepatic congestion from CHF. LFT's have significantly improved.  Almost back to baseline.  Multiple gallstones noted on abdominal ultrasound without cholecystitis.  Liver was noted to be normal in appearance.  Hepatitis panel is negative.    Essential hypertension Blood pressure is reasonably well controlled.  Continue to monitor closely.    History of COPD/tobacco abuse Seems to be stable.  She has not had a cigarette in over a week.  Tobacco cessation was discussed.  Diabetes mellitus type 2 Metformin has been held.  Continue sliding scale insulin coverage.  HbA1c 7.9.  History of coronary artery disease Continue aspirin and statin.  History of bare-metal stent to LAD in 2009.  And previous intervention to RCA.  DVT Prophylaxis: Lovenox    Code Status: DNR Family Communication: Discussed with the patient Disposition Plan: Further management per cardiology.  Seen by physical therapy and home health has been recommended.    LOS: 5 days   Osvaldo ShipperGokul Ewart Carrera  [facility] Pager [phone] 06/05/2017, 8:25 AM  If 7PM-7AM, please contact night-coverage at [email or web address], password [email or web address]

## 2017-06-05 NOTE — Progress Notes (Signed)
ANTICOAGULATION CONSULT NOTE   Pharmacy Consult for Heparin Indication: atrial fibrillation  Allergies  Allergen Reactions  . Codeine Nausea Only    Patient Measurements: Height: 5' (152.4 cm) Weight: 128 lb 14.4 oz (58.5 kg) IBW/kg (Calculated) : 45.5 Heparin Dosing Weight: 60kg   Vital Signs: Temp: 97.7 F (36.5 C) (12/23 0700) Temp Source: Oral (12/23 0700) BP: 120/76 (12/23 0700) Pulse Rate: 77 (12/23 1006)  Labs: Recent Labs    06/03/17 0451 06/04/17 0518 06/04/17 2242 06/05/17 0332 06/05/17 0852  HGB 11.5*  --   --  12.0  --   HCT 36.4  --   --  38.4  --   PLT 186  --   --  244  --   HEPARINUNFRC  --   --  0.57  --  0.65  CREATININE 1.51* 1.44*  --  1.51*  --     Estimated Creatinine Clearance: 26.6 mL/min (A) (by C-G formula based on SCr of 1.51 mg/dL (H)).   Medical History: Past Medical History:  Diagnosis Date  . Atrial septal defect    hx of it  . CAD (coronary artery disease)    s/p prior stenting of the right coronary artery in '01 and non-ST-elevation myocardial infarction in jan '09, with a new bare-metal stent placed in the ostium of the left anterior descending coronary artert and the coronary anatomy as described above with total occlusion of the previously placed right coronary artery stent. EF 35-40%  . CHF (congestive heart failure) (HCC)   . Diabetes mellitus without complication (HCC)   . Hepatitis 1971   " A"  . History of cervical cancer   . History of non-Hodgkin's lymphoma   . HTN (hypertension)   . HTN (hypertension)   . Hyperlipidemia   . Ischemic cardiomyopathy    EF 35-40% by 01/2016 echo  . Myocardial infarction (HCC) 2009, 2000  . Other and unspecified hyperlipidemia   . Peripheral vascular disease (HCC)   . Personal history of malignant neoplasm of cervix uteri   . Personal history of other lymphatic and hematopoietic neoplasm 2002   hodgkins lymphoma   Assessment: 71 year old female continuing on heparin for new  afib. Patient not on anticoagulation prior to admit. Heparin level remains therapeutic. CBC wnl. No bleed documented.  Goal of Therapy:  Heparin level 0.3-0.7 units/ml Monitor platelets by anticoagulation protocol: Yes   Plan:  Continue heparin at 800 units/hr Daily heparin level/CBC Monitor for s/sx bleeding Cardiology to re-attempt cath on Monday  Babs BertinHaley Dania Marsan, PharmD, BCPS Clinical Pharmacist Clinical phone for 06/05/2017 until 3:30pm: [phone] If after 3:30pm, please call main pharmacy at: [phone] 06/05/2017 10:42 AM

## 2017-06-05 NOTE — Progress Notes (Signed)
Advanced Heart Failure Rounding Note  PCP:  Primary Cardiologist:   Subjective:    Remains on IV lasix. Diuresed 5 pounds last night (down now total 24 pounds).  Developed PAF yesterday and started on IV amio yesterday. Felt poorly overnight but now better.   Dyspnea improved. Remains in AF    Objective:   Weight Range: 58.5 kg (128 lb 14.4 oz) Body mass index is 25.17 kg/m.   Vital Signs:   Temp:  [97.4 F (36.3 C)-98.2 F (36.8 C)] 97.7 F (36.5 C) (12/23 0700) Pulse Rate:  [65-99] 77 (12/23 1006) Resp:  [20] 20 (12/23 0700) BP: (100-132)/(58-78) 104/58 (12/23 1006) SpO2:  [97 %-99 %] 99 % (12/23 0700) Weight:  [58.5 kg (128 lb 14.4 oz)] 58.5 kg (128 lb 14.4 oz) (12/23 0700) Last BM Date: 06/04/17  Weight change: Filed Weights   06/03/17 0406 06/04/17 0500 06/05/17 0700  Weight: 63.6 kg (140 lb 4.8 oz) 60.3 kg (133 lb) 58.5 kg (128 lb 14.4 oz)    Intake/Output:   Intake/Output Summary (Last 24 hours) at 06/05/2017 1206 Last data filed at 06/05/2017 0700 Gross per 24 hour  Intake 1027.4 ml  Output 1400 ml  Net -372.6 ml      Physical Exam    General:  Lying in bed. No resp difficulty HEENT: normal Neck: supple. no JVD. Carotids 2+ bilat; no bruits. No lymphadenopathy or thryomegaly appreciated. Cor: PMI laterally displaced. Irregular rate & rhythm. No rubs, gallops or murmurs. Lungs: clear Abdomen: soft, nontender, nondistended. No hepatosplenomegaly. No bruits or masses. Good bowel sounds. Extremities: no cyanosis, clubbing, rash, edema Neuro: alert & orientedx3, cranial nerves grossly intact. moves all 4 extremities w/o difficulty. Affect pleasant   Telemetry   AF 70s Personally reviewed    EKG    N/A   Labs    CBC Recent Labs    06/03/17 0451 06/05/17 0332  WBC 8.6 9.8  NEUTROABS 4.7  --   HGB 11.5* 12.0  HCT 36.4 38.4  MCV 84.1 83.8  PLT 186 244   Basic Metabolic Panel Recent Labs    09/81/1912/21/18 0451 06/04/17 0518  06/05/17 0332  NA 136 138 137  K 3.2* 4.5 3.9  CL 95* 100* 99*  CO2 32 32 31  GLUCOSE 81 132* 116*  BUN 48* 40* 43*  CREATININE 1.51* 1.44* 1.51*  CALCIUM 8.3* 8.4* 8.3*  MG 2.1  --   --    Liver Function Tests Recent Labs    06/04/17 0518 06/05/17 0332  AST 50* 36  ALT 138* 100*  ALKPHOS 127* 110  BILITOT 0.9 0.7  PROT 6.0* 5.8*  ALBUMIN 2.7* 2.8*   No results for input(s): LIPASE, AMYLASE in the last 72 hours. Cardiac Enzymes No results for input(s): CKTOTAL, CKMB, CKMBINDEX, TROPONINI in the last 72 hours.  BNP: BNP (last 3 results) Recent Labs    02/14/17 0948 05/30/17 1744  BNP 1,788.7* 2,426.0*    ProBNP (last 3 results) No results for input(s): PROBNP in the last 8760 hours.   D-Dimer No results for input(s): DDIMER in the last 72 hours. Hemoglobin A1C No results for input(s): HGBA1C in the last 72 hours. Fasting Lipid Panel No results for input(s): CHOL, HDL, LDLCALC, TRIG, CHOLHDL, LDLDIRECT in the last 72 hours. Thyroid Function Tests No results for input(s): TSH, T4TOTAL, T3FREE, THYROIDAB in the last 72 hours.  Invalid input(s): FREET3  Other results:   Imaging    No results found.   Medications:  Scheduled Medications: . aspirin EC  81 mg Oral QPM  . digoxin  0.0625 mg Oral Daily  . furosemide  80 mg Intravenous Q12H  . insulin aspart  0-15 Units Subcutaneous TID WC  . lisinopril  2.5 mg Oral QHS  . pregabalin  75 mg Oral QHS  . simvastatin  20 mg Oral q1800  . sodium chloride flush  3 mL Intravenous Q12H  . spironolactone  12.5 mg Oral Daily    Infusions: . sodium chloride    . amiodarone 30 mg/hr (06/04/17 2129)  . heparin 800 Units/hr (06/04/17 1455)    PRN Medications: sodium chloride, acetaminophen **OR** acetaminophen, hydrALAZINE, ondansetron **OR** ondansetron (ZOFRAN) IV, sodium chloride flush, sodium chloride flush, traMADol    Patient Profile   Riley Lammma J Atlas is a 71 y.o. female with PMH CAD (s/p  stenting of RCA in 2001, BMS to LAD in 2009 with total occlusion of previously placed RCA stent noted with collateral flow present), ICM, Systolic CHF, PAD s/p aortobifemoral bypass in 02/2017, post op afib, HTN, HLD, and AAA.   [facility] with worsening dyspnea. Diuresed on IV lasix. Coox 54% on 06/01/17 so [facility] and treatment by HF team.     Assessment/Plan   1. Acute on Chronic Combined Systolic and Diastolic CHF -> Biventricular failure by Echo.  - EF worsened to 10% from  EF of 30-35% by echo in 02/2017. Unclear etiology. Now with biventricular dysfunction  - Volume status improved. Now euvolemic. Sop IV lasix. Will not start po lasix until after cath - Continue spiro 12.5 mg  daily.  - Off b-blocker due to acute decompensation..    - Continue lisinopril 2.5. Refuses Entresto - Long talk with her and multiple family members yesterday (including her daughter) about her situation and possible poor prognosis. We have decided to reattempt cath tomorrow. Previously tried left radial approach in 9/18 -> at that time we cannulated left radial artery multiple times but unable to feed wire.  Will try right radial. If unable to gain access can stick aorto-bifem graft.   2. CAD/Elevated Troponin  -s/p stenting of RCA in 2001, BMS to LAD in 2009 with total occlusion of previously placed RCA stent noted with collateral flow present. - No s/s ischemia. Cath tomorrow - Continue ASA and statin therapy.   3.PAD -s/p aortobifemoral bypass in 02/2017.  - Followed by vascular surgery.   4. Paroxysmal atrial fibrillation -Had post-op AF after surgery in 02/2017.  - Recurred yesterday. Continue IV amio and heparin. May need DC-CV.  5. AKI -Creatinine now stable 1.4-> 1.5 Stopping lasix.  - Had been taking lisinopril AND Entresto, but then STOPPED Entresto due to cost. Unwilling to restart  - Continue to follow closely.   6. Transaminitis -  Abdominal USshowed known gallstones but no acute findings. LFTs trending down  7. NSVT - Goal K > 4.0 and Mg > 2.0.  Supp K.    Length of Stay: 5  Arvilla Meresaniel Bensimhon, MD  06/05/2017, 12:06 PM  Advanced Heart Failure Team Pager [phone] (M-F; 7a - 4p)  Please contact [facility] for night-coverage after hours (4p -7a ) and weekends on [email or web address]

## 2017-06-06 ENCOUNTER — Encounter (HOSPITAL_COMMUNITY): Payer: Self-pay | Admitting: Internal Medicine

## 2017-06-06 ENCOUNTER — Encounter (HOSPITAL_COMMUNITY)
Admission: EM | Disposition: A | Discharge status: Home Health | Payer: Self-pay | Source: Home / Self Care | Attending: Internal Medicine

## 2017-06-06 DIAGNOSIS — I25110 Atherosclerotic heart disease of native coronary artery with unstable angina pectoris: Secondary | ICD-10-CM

## 2017-06-06 HISTORY — PX: RIGHT/LEFT HEART CATH AND CORONARY ANGIOGRAPHY: CATH118266

## 2017-06-06 LAB — POCT I-STAT 3, ART BLOOD GAS (G3+)
Acid-Base Excess: 1 mmol/L (ref 0.0–2.0)
BICARBONATE: 26.8 mmol/L (ref 20.0–28.0)
O2 Saturation: 97 %
PH ART: 7.352 (ref 7.350–7.450)
PO2 ART: 98 mmHg (ref 83.0–108.0)
TCO2: 28 mmol/L (ref 22–32)
pCO2 arterial: 48.3 mmHg — ABNORMAL HIGH (ref 32.0–48.0)

## 2017-06-06 LAB — POCT I-STAT 3, VENOUS BLOOD GAS (G3P V)
ACID-BASE EXCESS: 1 mmol/L (ref 0.0–2.0)
Acid-Base Excess: 2 mmol/L (ref 0.0–2.0)
BICARBONATE: 28.9 mmol/L — AB (ref 20.0–28.0)
BICARBONATE: 29 mmol/L — AB (ref 20.0–28.0)
O2 SAT: 61 %
O2 SAT: 62 %
PCO2 VEN: 56.2 mmHg (ref 44.0–60.0)
PCO2 VEN: 57.1 mmHg (ref 44.0–60.0)
PO2 VEN: 35 mmHg (ref 32.0–45.0)
PO2 VEN: 36 mmHg (ref 32.0–45.0)
TCO2: 31 mmol/L (ref 22–32)
TCO2: 31 mmol/L (ref 22–32)
pH, Ven: 7.313 (ref 7.250–7.430)
pH, Ven: 7.32 (ref 7.250–7.430)

## 2017-06-06 LAB — COMPREHENSIVE METABOLIC PANEL
ALBUMIN: 3.1 g/dL — AB (ref 3.5–5.0)
ALK PHOS: 113 U/L (ref 38–126)
ALT: 89 U/L — AB (ref 14–54)
AST: 33 U/L (ref 15–41)
Anion gap: 9 (ref 5–15)
BUN: 36 mg/dL — ABNORMAL HIGH (ref 6–20)
CALCIUM: 8.7 mg/dL — AB (ref 8.9–10.3)
CHLORIDE: 96 mmol/L — AB (ref 101–111)
CO2: 29 mmol/L (ref 22–32)
CREATININE: 1.51 mg/dL — AB (ref 0.44–1.00)
GFR calc Af Amer: 38 mL/min — ABNORMAL LOW (ref >60)
GFR calc non Af Amer: 33 mL/min — ABNORMAL LOW (ref >60)
GLUCOSE: 132 mg/dL — AB (ref 65–99)
Potassium: 4 mmol/L (ref 3.5–5.1)
SODIUM: 134 mmol/L — AB (ref 135–145)
Total Bilirubin: 0.8 mg/dL (ref 0.3–1.2)
Total Protein: 6.5 g/dL (ref 6.5–8.1)

## 2017-06-06 LAB — GLUCOSE, CAPILLARY
GLUCOSE-CAPILLARY: 120 mg/dL — AB (ref 65–99)
Glucose-Capillary: 112 mg/dL — ABNORMAL HIGH (ref 65–99)
Glucose-Capillary: 103 mg/dL — ABNORMAL HIGH (ref 65–99)
Glucose-Capillary: 185 mg/dL — ABNORMAL HIGH (ref 65–99)
Glucose-Capillary: 219 mg/dL — ABNORMAL HIGH (ref 65–99)

## 2017-06-06 LAB — CBC
HCT: 39.8 % (ref 36.0–46.0)
Hemoglobin: 12.5 g/dL (ref 12.0–15.0)
MCH: 26.3 pg (ref 26.0–34.0)
MCHC: 31.4 g/dL (ref 30.0–36.0)
MCV: 83.8 fL (ref 78.0–100.0)
PLATELETS: 268 10*3/uL (ref 150–400)
RBC: 4.75 MIL/uL (ref 3.87–5.11)
RDW: 14.8 % (ref 11.5–15.5)
WBC: 11.1 10*3/uL — ABNORMAL HIGH (ref 4.0–10.5)

## 2017-06-06 LAB — POCT ACTIVATED CLOTTING TIME: Activated Clotting Time: 131 seconds

## 2017-06-06 LAB — COOXEMETRY PANEL
Carboxyhemoglobin: 0.9 % (ref 0.5–1.5)
METHEMOGLOBIN: 1 % (ref 0.0–1.5)
O2 Saturation: 55.1 %
TOTAL HEMOGLOBIN: 13.1 g/dL (ref 12.0–16.0)

## 2017-06-06 LAB — PROTIME-INR
INR: 1.1
Prothrombin Time: 14.1 seconds (ref 11.4–15.2)

## 2017-06-06 LAB — HEPARIN LEVEL (UNFRACTIONATED): HEPARIN UNFRACTIONATED: 0.61 [IU]/mL (ref 0.30–0.70)

## 2017-06-06 SURGERY — RIGHT/LEFT HEART CATH AND CORONARY ANGIOGRAPHY
Anesthesia: LOCAL

## 2017-06-06 MED ORDER — AMIODARONE HCL 200 MG PO TABS
200.0000 mg | ORAL_TABLET | Freq: Every day | ORAL | Status: DC
  Administered 2017-06-07 – 2017-06-10 (×3): 200 mg via ORAL
  Filled 2017-06-06 (×3): qty 1

## 2017-06-06 MED ORDER — HEPARIN (PORCINE) IN NACL 2-0.9 UNIT/ML-% IJ SOLN
INTRAMUSCULAR | Status: AC | PRN
  Administered 2017-06-06: 1000 mL via INTRA_ARTERIAL

## 2017-06-06 MED ORDER — IOPAMIDOL (ISOVUE-370) INJECTION 76%
INTRAVENOUS | Status: AC
  Filled 2017-06-06: qty 100

## 2017-06-06 MED ORDER — SODIUM CHLORIDE 0.9 % IV SOLN
250.0000 mL | INTRAVENOUS | Status: DC | PRN
  Administered 2017-06-06: 250 mL via INTRAVENOUS

## 2017-06-06 MED ORDER — MIDAZOLAM HCL 2 MG/2ML IJ SOLN
INTRAMUSCULAR | Status: DC | PRN
  Administered 2017-06-06: 2 mg via INTRAVENOUS

## 2017-06-06 MED ORDER — FENTANYL CITRATE (PF) 100 MCG/2ML IJ SOLN
INTRAMUSCULAR | Status: DC | PRN
  Administered 2017-06-06: 25 ug via INTRAVENOUS

## 2017-06-06 MED ORDER — ACETAMINOPHEN 325 MG PO TABS: 650.0000 mg | ORAL_TABLET | ORAL | Status: DC | PRN

## 2017-06-06 MED ORDER — IOPAMIDOL (ISOVUE-370) INJECTION 76%
INTRAVENOUS | Status: DC | PRN
  Administered 2017-06-06: 30 mL via INTRA_ARTERIAL

## 2017-06-06 MED ORDER — HEPARIN (PORCINE) IN NACL 2-0.9 UNIT/ML-% IJ SOLN
INTRAMUSCULAR | Status: AC
  Filled 2017-06-06: qty 1000

## 2017-06-06 MED ORDER — LIDOCAINE HCL (PF) 1 % IJ SOLN
INTRAMUSCULAR | Status: DC | PRN
  Administered 2017-06-06: 2 mL via INTRADERMAL
  Administered 2017-06-06: 15 mL via INTRADERMAL

## 2017-06-06 MED ORDER — MIDAZOLAM HCL 2 MG/2ML IJ SOLN
INTRAMUSCULAR | Status: AC
  Filled 2017-06-06: qty 2

## 2017-06-06 MED ORDER — SODIUM CHLORIDE 0.9 % IV SOLN: INTRAVENOUS | Status: AC

## 2017-06-06 MED ORDER — SODIUM CHLORIDE 0.9% FLUSH: 3.0000 mL | INTRAVENOUS | Status: DC | PRN

## 2017-06-06 MED ORDER — LIDOCAINE HCL (PF) 1 % IJ SOLN
INTRAMUSCULAR | Status: AC
  Filled 2017-06-06: qty 30

## 2017-06-06 MED ORDER — ONDANSETRON HCL 4 MG/2ML IJ SOLN: 4.0000 mg | Freq: Four times a day (QID) | INTRAMUSCULAR | Status: DC | PRN

## 2017-06-06 MED ORDER — SODIUM CHLORIDE 0.9% FLUSH: 3.0000 mL | Freq: Two times a day (BID) | INTRAVENOUS | Status: DC

## 2017-06-06 MED ORDER — FENTANYL CITRATE (PF) 100 MCG/2ML IJ SOLN
INTRAMUSCULAR | Status: AC
  Filled 2017-06-06: qty 2

## 2017-06-06 MED ORDER — HEPARIN (PORCINE) IN NACL 100-0.45 UNIT/ML-% IJ SOLN
900.0000 [IU]/h | INTRAMUSCULAR | Status: DC
  Administered 2017-06-06: 800 [IU]/h via INTRAVENOUS
  Administered 2017-06-07: 900 [IU]/h via INTRAVENOUS
  Filled 2017-06-06 (×2): qty 250

## 2017-06-06 MED ORDER — AMIODARONE HCL 200 MG PO TABS: 200.0000 mg | ORAL_TABLET | Freq: Every day | ORAL | Status: DC

## 2017-06-06 SURGICAL SUPPLY — 16 items
CATH INFINITI MULTIPACK ST 5F (CATHETERS) ×2 IMPLANT
CATH SWAN GANZ 7F STRAIGHT (CATHETERS) ×2 IMPLANT
COVER PRB 48X5XTLSCP FOLD TPE (BAG) ×1 IMPLANT
COVER PROBE 5X48 (BAG) ×1
GLIDESHEATH SLEND SS 6F .021 (SHEATH) ×2 IMPLANT
GUIDEWIRE INQWIRE 1.5J.035X260 (WIRE) ×1 IMPLANT
INQWIRE 1.5J .035X260CM (WIRE) ×2
KIT HEART LEFT (KITS) ×2 IMPLANT
KIT MICROINTRODUCER STIFF 5F (SHEATH) ×2 IMPLANT
PACK CARDIAC CATHETERIZATION (CUSTOM PROCEDURE TRAY) ×2 IMPLANT
SHEATH GLIDE SLENDER 4/5FR (SHEATH) ×2 IMPLANT
SHEATH PINNACLE 5F 10CM (SHEATH) ×2 IMPLANT
SHEATH PINNACLE 7F 10CM (SHEATH) ×2 IMPLANT
TRANSDUCER W/STOPCOCK (MISCELLANEOUS) ×2 IMPLANT
TUBING CIL FLEX 10 FLL-RA (TUBING) ×2 IMPLANT
WIRE EMERALD 3MM-J .035X150CM (WIRE) ×2 IMPLANT

## 2017-06-06 NOTE — Progress Notes (Signed)
PT Cancellation Note  Patient Details Name: Charlotte Salas MRN: [id number] DOB: July 14, 1945   Cancelled Treatment:    Reason Eval/Treat Not Completed: Patient at procedure or test/unavailable. Pt off unit at this time for heart cath and coronary angiography. Will continue to follow.    [name] 06/06/2017, 9:48 AM   [name], PT, DPT Acute Rehabilitation Services Pager: [phone]

## 2017-06-06 NOTE — Progress Notes (Addendum)
ANTICOAGULATION CONSULT NOTE   Pharmacy Consult for Heparin Indication: atrial fibrillation  Allergies  Allergen Reactions  . Codeine Nausea Only    Patient Measurements: Height: 5' (152.4 cm) Weight: 129 lb 9.6 oz (58.8 kg)(scale a) IBW/kg (Calculated) : 45.5 Heparin Dosing Weight: 60kg   Vital Signs: Temp: 97.8 F (36.6 C) (12/24 0354) Temp Source: Oral (12/24 0354) BP: 116/59 (12/24 0354) Pulse Rate: 71 (12/24 0354)  Labs: Recent Labs    06/04/17 0518 06/04/17 2242 06/05/17 0332 06/05/17 0852 06/06/17 0307 06/06/17 0320  HGB  --   --  12.0  --  12.5  --   HCT  --   --  38.4  --  39.8  --   PLT  --   --  244  --  268  --   LABPROT  --   --   --   --   --  14.1  INR  --   --   --   --   --  1.10  HEPARINUNFRC  --  0.57  --  0.65  --  0.61  CREATININE 1.44*  --  1.51*  --  1.51*  --     Estimated Creatinine Clearance: 26.6 mL/min (A) (by C-G formula based on SCr of 1.51 mg/dL (H)).   Medical History: Past Medical History:  Diagnosis Date  . Atrial septal defect    hx of it  . CAD (coronary artery disease)    s/p prior stenting of the right coronary artery in '01 and non-ST-elevation myocardial infarction in jan '09, with a new bare-metal stent placed in the ostium of the left anterior descending coronary artert and the coronary anatomy as described above with total occlusion of the previously placed right coronary artery stent. EF 35-40%  . CHF (congestive heart failure) (HCC)   . Diabetes mellitus without complication (HCC)   . Hepatitis 1971   " A"  . History of cervical cancer   . History of non-Hodgkin's lymphoma   . HTN (hypertension)   . HTN (hypertension)   . Hyperlipidemia   . Ischemic cardiomyopathy    EF 35-40% by 01/2016 echo  . Myocardial infarction (HCC) 2009, 2000  . Other and unspecified hyperlipidemia   . Peripheral vascular disease (HCC)   . Personal history of malignant neoplasm of cervix uteri   . Personal history of other lymphatic  and hematopoietic neoplasm 2002   hodgkins lymphoma   Assessment: 71 year old female continuing on heparin for new afib. Patient not on anticoagulation prior to admit. Heparin level remains therapeutic. CBC wnl. Plans noted for cath today  Goal of Therapy:  Heparin level 0.3-0.7 units/ml Monitor platelets by anticoagulation protocol: Yes   Plan:  Continue heparin at 800 units/hr Daily heparin level/CBC Will follow plans post cath  Harland Germanndrew Dionna Wiedemann, Pharm D 06/06/2017 8:12 AM  Addendum -Cath completed: heparin to restart 8 hours after sheath removal (done ~ 11am)  Plan -Restart heparin at 800 units/hr at 7pm  -Daily heparin level and CBC  Harland GermanAndrew Lucero Auzenne, Pharm D 06/06/2017 12:38 PM

## 2017-06-06 NOTE — Progress Notes (Signed)
[facility] PROGRESS NOTE  Charlotte Salas ZOX:[id number] DOB: 16-Jan-1946 DOA: 05/30/2017  PCP: Stoney BangWinikur, Melinda, FNP  Brief History/Interval Summary: 71 year old female with past medical history of coronary artery disease, chronic systolic CHF with previously known EF of 30-35%, hypertension, diabetes mellitus, peripheral artery disease presented with 2-day history of shortness of breath, orthopnea, lower extremity edema.  Patient initially presented to the hospital in [place], [place] from here she was sent over here for further management.  Patient seen by heart failure team.  She is getting diuresed.  She is improving slowly.  Reason for Visit: Acute systolic CHF.  Atrial fibrillation  Consultants: Cardiology  Procedures:   Transthoracic echocardiogram Study Conclusions  - Left ventricle: The cavity size was mildly dilated. Wall   thickness was at the upper limits of normal. Systolic function   was severely reduced. The estimated ejection fraction was in the   range of 15% to 20%. Diffuse hypokinesis. There is akinesis of   the inferoseptal myocardium. The study is not technically   sufficient to allow evaluation of LV diastolic function. - Ventricular septum: Septal motion showed abnormal function and   dyssynergy. - Aortic valve: Moderately calcified annulus. Trileaflet. - Mitral valve: Mildly calcified annulus. There was moderate   regurgitation. - Left atrium: The atrium was severely dilated. - Right ventricle: The cavity size was mildly dilated. Systolic   function was moderately to severely reduced. - Right atrium: The atrium was mildly to moderately dilated.   Central venous pressure (est): 8 mm Hg. - Tricuspid valve: There was severe regurgitation. - Pulmonary arteries: PA peak pressure: 51 mm Hg (S). - Pericardium, extracardiac: There was no pericardial effusion.  Impressions:  - Upper normal LV wall thickness with mild chamber dilatation and  LVEF approximately 15-20%. There is diffuse hypokinesis with   regional variation and akinesis of the inferoseptal wall and   septal dyssynergy. Indeterminate diastolic function, but appears   to be restrictive filling pattern. Severe left atrial   enlargement. Mildly calcified mitral annulus with at least   moderate mitral regurgitation. moderate aortic annular   calcification. Moderately dilated right ventricle with moderately   to severely reduced contraction. Mild to moderate right atrial   enlargement. Severe tricuspid regurgitation with PASP estimated   at 51 mmHg.  Cardiac catheterization Conclusion     Mid RCA to Dist RCA lesion is 100% stenosed.  Prox RCA lesion is 70% stenosed.  Mid LM lesion is 20% stenosed.  Prox LAD lesion is 95% stenosed.  Prox Cx lesion is 95% stenosed.  Ost 1st Diag to 1st Diag lesion is 40% stenosed.     Antibiotics: None  Subjective/Interval History: Patient did not sleep well overnight due to anxiety about her cardiac catheterization.  Denies any chest pain or shortness of breath.  No nausea vomiting.    ROS: Denies any headaches.  Objective:  Vital Signs  Vitals:   06/05/17 1006 06/05/17 1955 06/06/17 0051 06/06/17 0354  BP: (!) 104/58 125/65 (!) 119/53 (!) 116/59  Pulse: 77 (!) 106 64 71  Resp:  18  18  Temp:  98.3 F (36.8 C)  97.8 F (36.6 C)  TempSrc:  Oral  Oral  SpO2:  97%  97%  Weight:    58.8 kg (129 lb 9.6 oz)  Height:        Intake/Output Summary (Last 24 hours) at 06/06/2017 0811 Last data filed at 06/06/2017 0357 Gross per 24 hour  Intake 630.13 ml  Output 525 ml  Net 105.13 ml   Filed Weights   06/04/17 0500 06/05/17 0700 06/06/17 0354  Weight: 60.3 kg (133 lb) 58.5 kg (128 lb 14.4 oz) 58.8 kg (129 lb 9.6 oz)    General appearance: Awake alert.  In no distress Resp: Improved air entry bilaterally.  Few crackles at the bases.  No wheezing or rhonchi.  Normal effort at rest. Cardio: S1-S2 is  irregularly irregular.  Systolic murmur appreciated over the precordium. GI: Abdomen remains soft.  Nontender nondistended.  Bowel sounds are present. No masses or organomegaly. Extremities: Edema much improved. Neurologic: No focal deficits.  Lab Results:  Data Reviewed: I have personally reviewed following labs and imaging studies  CBC: Recent Labs  Lab 05/31/17 0546 06/02/17 0732 06/03/17 0451 06/05/17 0332 06/06/17 0307  WBC 10.3 8.7 8.6 9.8 11.1*  NEUTROABS  --  4.3 4.7  --   --   HGB 12.4 12.7 11.5* 12.0 12.5  HCT 39.9 39.9 36.4 38.4 39.8  MCV 85.8 84.9 84.1 83.8 83.8  PLT 153 192 186 244 268    Basic Metabolic Panel: Recent Labs  Lab 06/01/17 0738 06/02/17 0732 06/03/17 0451 06/04/17 0518 06/05/17 0332 06/06/17 0307  NA  --  137 136 138 137 134*  K  --  4.3 3.2* 4.5 3.9 4.0  CL  --  95* 95* 100* 99* 96*  CO2  --  32 32 32 31 29  GLUCOSE  --  113* 81 132* 116* 132*  BUN  --  50* 48* 40* 43* 36*  CREATININE  --  1.73* 1.51* 1.44* 1.51* 1.51*  CALCIUM  --  8.5* 8.3* 8.4* 8.3* 8.7*  MG 2.2  --  2.1  --   --   --     GFR: Estimated Creatinine Clearance: 26.6 mL/min (A) (by C-G formula based on SCr of 1.51 mg/dL (H)).  Liver Function Tests: Recent Labs  Lab 06/01/17 0416 06/03/17 0451 06/04/17 0518 06/05/17 0332 06/06/17 0307  AST 187* 72* 50* 36 33  ALT 298* 168* 138* 100* 89*  ALKPHOS 143* 129* 127* 110 113  BILITOT 1.0 1.2 0.9 0.7 0.8  PROT 5.6* 5.6* 6.0* 5.8* 6.5  ALBUMIN 2.7* 2.7* 2.7* 2.8* 3.1*    Cardiac Enzymes: Recent Labs  Lab 05/30/17 1744 05/31/17 0028 05/31/17 0546  TROPONINI 0.06* 0.05* 0.05*    CBG: Recent Labs  Lab 06/04/17 2052 06/05/17 0740 06/05/17 1146 06/05/17 1622 06/06/17 0732  GLUCAP 122* 112* 242* 150* 120*     Radiology Studies: No results found.   Medications:  Scheduled: . aspirin EC  81 mg Oral QPM  . digoxin  0.0625 mg Oral Daily  . insulin aspart  0-15 Units Subcutaneous TID WC  . pregabalin   75 mg Oral QHS  . simvastatin  20 mg Oral q1800  . sodium chloride flush  3 mL Intravenous Q12H  . sodium chloride flush  3 mL Intravenous Q12H   Continuous: . sodium chloride    . sodium chloride    . sodium chloride    . heparin 800 Units/hr (06/05/17 1310)   ZOX:WRUEAVPRN:sodium chloride, sodium chloride, acetaminophen **OR** acetaminophen, hydrALAZINE, ondansetron **OR** ondansetron (ZOFRAN) IV, sodium chloride flush, sodium chloride flush, sodium chloride flush, traMADol  Assessment/Plan:  Active Problems:   Essential hypertension   CAD in native artery   Tobacco abuse   AAA (abdominal aortic aneurysm) (HCC)   CHF exacerbation (HCC)   Acute renal failure superimposed on stage 3 chronic kidney disease (HCC)   Transaminasemia  CHF (congestive heart failure) (HCC)   AKI (acute kidney injury) (HCC)    Acute on chronic systolic congestive heart failure Ejection fraction noted to be about 15-20% based on echocardiogram done during this hospitalization.  Heart failure team is following.  Patient was placed on intravenous diuretics.  She has been diuresing well.  Lasix was discontinued yesterday for her to have her cardiac catheterization today.  His weight has gone up slightly.  May need Lasix post cardiac catheterization.  Her ACE inhibitor and spironolactone also held.  Not on beta-blocker due to acute decompensation.  Will defer to cardiology. Continue strict ins and outs daily weight.     Elevated troponin/Coronary artery disease Thought to be secondary to CHF and acute kidney injury.  Patient did not have any chest pain.   Back in September a coronary angiogram was attempted but could not be done due to access issues.  She underwent cardiac catheterization this morning which does show triple-vessel disease.  Cardiology will consult cardiothoracic surgery for evaluation. Continue aspirin and statin.  History of bare-metal stent to LAD in 2009.  And previous intervention to RCA.  Atrial  fibrillation Patient has previous history of same but while she was in sinus rhythm during the early part of this hospitalization.  Converted to atrial fibrillation during this hospitalization.  Started on amiodarone and IV heparin by cardiology.  Acute on chronic kidney disease stage III Baseline creatinine between 0.9-1.2.  Creatinine did go up as high as 1.7.  Creatinine has been improving.  Remains stable.  Continue to monitor urine output.    Transaminitis Most likely due to hepatic congestion from CHF. LFT's have significantly improved.  Almost back to baseline.  Multiple gallstones noted on abdominal ultrasound without cholecystitis.  Liver was noted to be normal in appearance.  Hepatitis panel is negative.    Cholelithiasis Asymptomatic.  Will need outpatient monitoring.  Essential hypertension Blood pressure is reasonably well controlled.  Continue to monitor closely.    History of COPD/tobacco abuse Seems to be stable.  Tobacco cessation was discussed.  Diabetes mellitus type 2 Metformin has been held.  CBGs are reasonably well controlled.  Continue sliding scale insulin coverage.  HbA1c 7.9.  DVT Prophylaxis: On IV heparin Code Status: DNR Family Communication: Discussed with the patient Disposition Plan: Further management per cardiology.  Seen by physical therapy and home health has been recommended.    LOS: 6 days   Osvaldo Shipper  [facility] Pager [phone] 06/06/2017, 8:11 AM  If 7PM-7AM, please contact night-coverage at [email or web address], password [email or web address]

## 2017-06-06 NOTE — Consult Note (Signed)
[address]       [place] [address]             [phone]        Charlotte Salas Ezel Medical Record #[id number] Date of Birth: 1945-10-01  Referring: Dr. Gala Romney  Primary Care: Stoney Bang, FNP  Chief Complaint:    Chief Complaint  Patient presents with  . Shortness of Breath  Patient examined, coronary angiogram images and 2-D echocardiogram images personally reviewed and counseled with patient  History of Present Illness:     Very nice 71 year old obese diabetic readmitted for symptoms of advanced heart failure with edema, shortness of breath, fatigue. The patient had a aortobifem bypass graft earlier this year for aa and bilateral femoral artery disease. Preop EF was 40%. She presents at this time with class IV heart failure and ejection fraction of 15%. She has severe TR and moderate MR. Severe RV and LV dysfunction. She has total occlusion of the RCA with suboptimal targets for grafting. She has moderate stenosis of a nondominant circumflex which is small and not an adequate target for grafting. The proximal LAD has a 90% stenosis. Right heart cath demonstrates maintained/compensated cardiac output. She has been in atrial fibrillation during his hospitalization treated with amiodarone.  The patient has had previous sternotomy and [place] for closure of an ASD when she was 71 years old. 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 She has COPD with significant changes on chest x-ray.  The patient is not a candidate for surgical coronary revascularization because of her severe LV dysfunction and comorbid disease. I would recommend consideration of PCI to LAD versus continued medical therapy.   Current Activity/ Functional Status: Severe functional limitations    Zubrod Score: At the time of surgery this patient's most appropriate activity status/level should be described as: []     0     Normal activity, no symptoms []     1    Restricted in physical strenuous activity but ambulatory, able to do out light work []     2    Ambulatory and capable of self care, unable to do work activities, up and about                 more than 50%  Of the time                            [x]     3    Only limited self care, in bed greater than 50% of waking hours []     4    Completely disabled, no self care, confined to bed or chair []     5    Moribund  Past Medical History:  Diagnosis Date  . Atrial septal defect    hx of it  . CAD (coronary artery disease)    s/p prior stenting of the right coronary artery in '01 and non-ST-elevation myocardial infarction in jan '09, with a new bare-metal stent placed in the ostium of the left anterior descending coronary artert and the coronary anatomy as described above with total occlusion of the previously placed right coronary artery stent. EF 35-40%  . CHF (congestive heart failure) (HCC)   . Diabetes mellitus without complication (HCC)   . Hepatitis 1971   " A"  . History of cervical cancer   . History of non-Hodgkin's lymphoma   .  HTN (hypertension)   . HTN (hypertension)   . Hyperlipidemia   . Ischemic cardiomyopathy    EF 35-40% by 01/2016 echo  . Myocardial infarction (HCC) 2009, 2000  . Other and unspecified hyperlipidemia   . Peripheral vascular disease (HCC)   . Personal history of malignant neoplasm of cervix uteri   . Personal history of other lymphatic and hematopoietic neoplasm 2002   hodgkins lymphoma    Past Surgical History:  Procedure Laterality Date  . ABDOMINAL AORTOGRAM N/A 01/11/2017   Procedure: Abdominal Aortogram;  Surgeon: Nada LibmanBrabham, Vance W, MD;  Location: [facility];  Service: Cardiovascular;  Laterality: N/A;  . ANGIOPLASTY Left 02/09/2017   Procedure: LEFT PROFUNDA  FEMORAL ARTERY ANGIOPLASTY USING XENOSURE BIOLOGIC PATCH;  Surgeon: Nada LibmanBrabham, Vance W, MD;  Location: [facility] OR;  Service: Vascular;  Laterality: Left;    . AORTA - BILATERAL FEMORAL ARTERY BYPASS GRAFT N/A 02/09/2017   Procedure: AORTA BIFEMORAL BYPASS GRAFT USING HEMASHIELD GOLD;  Surgeon: Nada LibmanBrabham, Vance W, MD;  Location: [facility] OR;  Service: Vascular;  Laterality: N/A;  . ASD AND VSD REPAIR    . LOWER EXTREMITY ANGIOGRAPHY Bilateral 01/11/2017   Procedure: Lower Extremity Angiography;  Surgeon: Nada LibmanBrabham, Vance W, MD;  Location: [facility];  Service: Cardiovascular;  Laterality: Bilateral;  . RIGHT HEART CATH N/A 02/18/2017   Procedure: RIGHT HEART CATH;  Surgeon: Dolores PattyBensimhon, Daniel R, MD;  [facility];  Service: Cardiovascular;  Laterality: N/A;  . RIGHT/LEFT HEART CATH AND CORONARY ANGIOGRAPHY N/A 06/06/2017   Procedure: RIGHT/LEFT HEART CATH AND CORONARY ANGIOGRAPHY;  Surgeon: Dolores PattyBensimhon, Daniel R, MD;  Location: [facility];  Service: Cardiovascular;  Laterality: N/A;  . VEIN SURGERY Bilateral Jan. Feb. and Mar. 2018   Dr. Earna Coderzachary ([facility] [place]    Social History   Tobacco Use  Smoking Status Current Some Day Smoker  . Packs/day: 0.25  . Years: 50.00  . Pack years: 12.50  . Types: Cigarettes, E-cigarettes  Smokeless Tobacco Never Used  Tobacco Comment   1 ppd    Social History   Substance and Sexual Activity  Alcohol Use No    Social History   Socioeconomic History  . Marital status: Divorced    Spouse name: Not on file  . Number of children: Not on file  . Years of education: Not on file  . Highest education level: Not on file  Social Needs  . Financial resource strain: Not on file  . Food insecurity - worry: Not on file  . Food insecurity - inability: Not on file  . Transportation needs - medical: Not on file  . Transportation needs - non-medical: Not on file  Occupational History  . Not on file  Tobacco Use  . Smoking status: Current Some Day Smoker    Packs/day: 0.25    Years: 50.00    Pack years: 12.50    Types: Cigarettes, E-cigarettes  . Smokeless tobacco:  Never Used  . Tobacco comment: 1 ppd  Substance and Sexual Activity  . Alcohol use: No  . Drug use: No  . Sexual activity: Not on file  Other Topics Concern  . Not on file  Social History Narrative   Lives in [place], [place] alone.   Tracer study.     Allergies  Allergen Reactions  . Codeine Nausea Only    Current Facility-Administered Medications  Medication Dose Route Frequency Provider Last Rate Last Dose  . 0.9 %  sodium  chloride infusion  250 mL Intravenous PRN Meredeth IdeLama, Gagan S, MD      . 0.9 %  sodium chloride infusion  250 mL Intravenous PRN Bensimhon, Bevelyn Bucklesaniel R, MD      . acetaminophen (TYLENOL) tablet 650 mg  650 mg Oral Q6H PRN Meredeth IdeLama, Gagan S, MD       Or  . acetaminophen (TYLENOL) suppository 650 mg  650 mg Rectal Q6H PRN Meredeth IdeLama, Gagan S, MD      . amiodarone (PACERONE) tablet 200 mg  200 mg Oral Daily Bensimhon, Bevelyn Bucklesaniel R, MD      . aspirin EC tablet 81 mg  81 mg Oral QPM Meredeth IdeLama, Gagan S, MD   81 mg at 06/05/17 1726  . heparin ADULT infusion 100 units/mL (25000 units/24150mL sodium chloride 0.45%)  800 Units/hr Intravenous Continuous Silvana NewnessMeyer, Andrew D, RPH      . hydrALAZINE (APRESOLINE) injection 10 mg  10 mg Intravenous Q8H PRN Haydee SalterHobbs, Phillip M, MD      . insulin aspart (novoLOG) injection 0-15 Units  0-15 Units Subcutaneous TID WC Catarina Hartshornat, David, MD   2 Units at 06/05/17 1726  . ondansetron (ZOFRAN) tablet 4 mg  4 mg Oral Q6H PRN Meredeth IdeLama, Gagan S, MD       Or  . ondansetron (ZOFRAN) injection 4 mg  4 mg Intravenous Q6H PRN Meredeth IdeLama, Gagan S, MD      . ondansetron [facility](ZOFRAN) injection 4 mg  4 mg Intravenous Q6H PRN Bensimhon, Bevelyn Bucklesaniel R, MD      . pregabalin (LYRICA) capsule 75 mg  75 mg Oral QHS Meredeth IdeLama, Gagan S, MD   75 mg at 06/05/17 2155  . simvastatin (ZOCOR) tablet 20 mg  20 mg Oral q1800 Meredeth IdeLama, Gagan S, MD   20 mg at 06/05/17 1726  . sodium chloride flush (NS) 0.9 % injection 10-40 mL  10-40 mL Intracatheter PRN Tat, David, MD      . sodium chloride flush (NS) 0.9 % injection 3 mL  3 mL  Intravenous Q12H Meredeth IdeLama, Gagan S, MD   3 mL at 06/05/17 2155  . sodium chloride flush (NS) 0.9 % injection 3 mL  3 mL Intravenous PRN [place], Sarina IllGagan S, MD      . sodium chloride flush (NS) 0.9 % injection 3 mL  3 mL Intravenous Q12H Bensimhon, Bevelyn Bucklesaniel R, MD      . sodium chloride flush (NS) 0.9 % injection 3 mL  3 mL Intravenous PRN Bensimhon, Bevelyn Bucklesaniel R, MD      . traMADol Janean Sark(ULTRAM) tablet 50 mg  50 mg Oral Q6H PRN Meredeth IdeLama, Gagan S, MD   50 mg at 06/06/17 0053    Medications Prior to Admission  Medication Sig Dispense Refill Last Dose  . aspirin EC 81 MG tablet Take 81 mg by mouth every evening.    05/29/2017 at Unknown time  . carvedilol (COREG) 12.5 MG tablet Take 12.5 mg by mouth 2 (two) times daily with a meal.   05/29/2017 at Unknown time  . Cranberry 1000 MG CAPS Take 1 capsule by mouth daily.    Past Month at Unknown time  . furosemide (LASIX) 40 MG tablet Take 1 tablet (40 mg total) by mouth daily. 30 tablet 0 05/30/2017 at Unknown time  . lisinopril (PRINIVIL,ZESTRIL) 10 MG tablet Take 10 mg by mouth daily.   Past Week at Unknown time  . metFORMIN (GLUCOPHAGE) 500 MG tablet Take 500 mg by mouth daily with breakfast.    05/30/2017 at Unknown time  .  nitroGLYCERIN (NITROSTAT) 0.4 MG SL tablet Place 1 tablet (0.4 mg total) under the tongue every 5 (five) minutes as needed for chest pain (for chest pain). 25 tablet 6 unknown  . pregabalin (LYRICA) 75 MG capsule Take 75 mg by mouth at bedtime.    05/29/2017 at Unknown time  . simvastatin (ZOCOR) 20 MG tablet Take 1 tablet (20 mg total) by mouth daily. 90 tablet 1 Past Week at Unknown time  . traMADol (ULTRAM) 50 MG tablet Take 1 tablet (50 mg total) by mouth every 6 (six) hours as needed. 20 tablet 0 05/29/2017 at Unknown time    Family History  Problem Relation Age of Onset  . Leukemia Father   . Heart Problems Mother   . Heart attack Sister      Review of Systems:   ROS      Cardiac Review of Systems: Y or  [    ]= no  Chest Pain [  n   ]  Resting SOB [ y  ] Exertional SOB  [ y ]  Pollyann Kennedy Milo.Brash  ]   Pedal Edema [  y ]    Palpitations [place]  ] Syncope  n  ]   Presyncope [  y ]  General Review of Systems: [Y] = yes [  ]=no Constitional: recent weight change [  ]; anorexia [  ]; fatigue [  ]; nausea [  ]; night sweats [  ]; fever [  ]; or chills [  ]                                                               Dental: poor dentition[  ]; Last Dentist visit: One year  Eye : blurred vision [  ]; diplopia [   ]; vision changes [  ];  Amaurosis fugax[  ]; Resp: cough [  ];  wheezing[  ];  hemoptysis[  ]; shortness of breath[  ]; paroxysmal nocturnal dyspnea[  ]; dyspnea on exertion[  ]; or orthopnea[  ];  GI:  gallstones[  ], vomiting[  ];  dysphagia[  ]; melena[  ];  hematochezia [  ]; heartburn[yes  ];   Hx of  Colonoscopy[  ]; GU: kidney stones [  yes]; hematuria[  ];   dysuria [  ];  nocturia[  ];  history of     obstruction [  ]; urinary frequency [  ]             Skin: rash, swelling[  ];, hair loss[  ];  peripheral edema yes [  ];  or itching[  ]; Musculosketetal: myalgias[  ];  joint swelling[  ];  joint erythema[  ];  joint pain[  ];  back pain[  ];  Heme/Lymph: bruising[  ];  bleeding[  ];  anemia[  ];  Neuro: TIA[  ];  headaches[  ];  stroke[  ];  vertigo[  ];  seizures[  ];   paresthesias[  ];  difficulty walking[  ];  Psych:depression[  ]; anxiety[  ];  Endocrine: diabetes[ yes ];  thyroid dysfunction[  ];  Immunizations: Flu [  ]; Pneumococcal[  ];  Other:  Physical Exam: BP (!) 123/56   Pulse 71   Temp (!) 97.5 F (36.4 C) (Oral)  Resp 13   Ht 5' (1.524 m)   Wt 129 lb 9.6 oz (58.8 kg) Comment: scale a  SpO2 95%   BMI 25.31 kg/m         Exam    General- alert and comfortable   Lungs- clear without rales, wheezes   Neck - mild JVD no bruit or adenopathy   Cor-well-healed sternal incision, irregular rate and rhythm, no murmur , gallop   Abdomen- soft, non-tender   Extremities - warm, non-tender, minimal  edema   Neuro- oriented, appropriate, no focal weakness   Diagnostic Studies & Laboratory data:     Recent Radiology Findings:   No results found. Chest x-ray images personally reviewed showing cardiomegaly with right pleural effusion and CHF   I have independently reviewed the above radiologic studies.  Recent Lab Findings:     Assessment / Plan:     Advanced heart failure from ischemic cardiomyopathy   Severe biventricular dysfunction Patient not a candidate for redo sternotomy and CABG Would consider PCI of LAD versus medical therapy of her ischemic cardiomyopathy.         06/06/2017 3:40 PM

## 2017-06-06 NOTE — Progress Notes (Addendum)
Advanced Heart Failure Rounding Note  PCP:  Primary Cardiologist: Dr Leory PlowmanBenismhon  Subjective:    Yesterday lasix stopped. Continues on amio 30 mg per hour.  Breathing ok.   Anxious to get cath done. Denies SOB/CP.    Objective:   Weight Range: 129 lb 9.6 oz (58.8 kg) Body mass index is 25.31 kg/m.   Vital Signs:   Temp:  [97.8 F (36.6 C)-98.3 F (36.8 C)] 97.8 F (36.6 C) (12/24 0354) Pulse Rate:  [64-106] 71 (12/24 0354) Resp:  [18] 18 (12/24 0354) BP: (104-125)/(53-65) 116/59 (12/24 0354) SpO2:  [97 %] 97 % (12/24 0354) Weight:  [129 lb 9.6 oz (58.8 kg)] 129 lb 9.6 oz (58.8 kg) (12/24 0354) Last BM Date: 06/04/17  Weight change: Filed Weights   06/04/17 0500 06/05/17 0700 06/06/17 0354  Weight: 133 lb (60.3 kg) 128 lb 14.4 oz (58.5 kg) 129 lb 9.6 oz (58.8 kg)    Intake/Output:   Intake/Output Summary (Last 24 hours) at 06/06/2017 0740 Last data filed at 06/06/2017 0357 Gross per 24 hour  Intake 630.13 ml  Output 525 ml  Net 105.13 ml      Physical Exam    General:  Appears chronically ill. No resp difficulty HEENT: normal Neck: supple. JVP 8-9. Carotids 2+ bilat; no bruits. No lymphadenopathy or thryomegaly appreciated. Cor: PMI nondisplaced. Irregular rate & rhythm. No rubs, gallops or murmurs. Lungs: clear Abdomen: soft, nontender, nondistended. No hepatosplenomegaly. No bruits or masses. Good bowel sounds. Extremities: no cyanosis, clubbing, rash, edema. RUE PICC Neuro: alert & orientedx3, cranial nerves grossly intact. moves all 4 extremities w/o difficulty. Affect pleasant   Telemetry   A fib 50s personally reviewed    EKG    N/A  Labs    CBC Recent Labs    06/05/17 0332 06/06/17 0307  WBC 9.8 11.1*  HGB 12.0 12.5  HCT 38.4 39.8  MCV 83.8 83.8  PLT 244 268   Basic Metabolic Panel Recent Labs    46/96/2912/23/18 0332 06/06/17 0307  NA 137 134*  K 3.9 4.0  CL 99* 96*  CO2 31 29  GLUCOSE 116* 132*  BUN 43* 36*  CREATININE  1.51* 1.51*  CALCIUM 8.3* 8.7*   Liver Function Tests Recent Labs    06/05/17 0332 06/06/17 0307  AST 36 33  ALT 100* 89*  ALKPHOS 110 113  BILITOT 0.7 0.8  PROT 5.8* 6.5  ALBUMIN 2.8* 3.1*   No results for input(s): LIPASE, AMYLASE in the last 72 hours. Cardiac Enzymes No results for input(s): CKTOTAL, CKMB, CKMBINDEX, TROPONINI in the last 72 hours.  BNP: BNP (last 3 results) Recent Labs    02/14/17 0948 05/30/17 1744  BNP 1,788.7* 2,426.0*    ProBNP (last 3 results) No results for input(s): PROBNP in the last 8760 hours.   D-Dimer No results for input(s): DDIMER in the last 72 hours. Hemoglobin A1C No results for input(s): HGBA1C in the last 72 hours. Fasting Lipid Panel No results for input(s): CHOL, HDL, LDLCALC, TRIG, CHOLHDL, LDLDIRECT in the last 72 hours. Thyroid Function Tests No results for input(s): TSH, T4TOTAL, T3FREE, THYROIDAB in the last 72 hours.  Invalid input(s): FREET3  Other results:   Imaging    No results found.   Medications:     Scheduled Medications: . aspirin EC  81 mg Oral QPM  . digoxin  0.0625 mg Oral Daily  . insulin aspart  0-15 Units Subcutaneous TID WC  . lisinopril  2.5 mg Oral QHS  .  pregabalin  75 mg Oral QHS  . simvastatin  20 mg Oral q1800  . sodium chloride flush  3 mL Intravenous Q12H  . sodium chloride flush  3 mL Intravenous Q12H  . spironolactone  12.5 mg Oral Daily    Infusions: . sodium chloride    . sodium chloride    . sodium chloride    . amiodarone 30 mg/hr (06/06/17 0054)  . heparin 800 Units/hr (06/05/17 1310)    PRN Medications: sodium chloride, sodium chloride, acetaminophen **OR** acetaminophen, hydrALAZINE, ondansetron **OR** ondansetron (ZOFRAN) IV, sodium chloride flush, sodium chloride flush, sodium chloride flush, traMADol    Patient Profile   Charlotte Salas is a 71 y.o. female with PMH CAD (s/p stenting of RCA in 2001, BMS to LAD in 2009 with total occlusion of previously  placed RCA stent noted with collateral flow present), ICM, Systolic CHF, PAD s/p aortobifemoral bypass in 02/2017, post op afib, HTN, HLD, and AAA.   [facility] with worsening dyspnea. Diuresed on IV lasix. Coox 54% on 06/01/17 so [facility] and treatment by HF team.     Assessment/Plan   1. Acute on Chronic Combined Systolic and Diastolic CHF -> Biventricular failure by Echo.  - EF worsened to 10% from  EF of 30-35% by echo in 02/2017. Unclear etiology. Now with biventricular dysfunction  - on digoxin 0.0625 mg. Check dig level in am.  - Volume status stable. Keep off lasix until after cath.  - Stop spiro. Stop lisinopril  - Off b-blocker due to acute decompensation.Marland Kitchen.    Drue Stager- Refuses Entresto -Per Dr  Gala RomneyBensimhon had long talk with her and multiple family members yesterday (including her daughter) about her situation and possible poor prognosis. Plan to reattempt cath today. Previously tried left radial approach in 9/18 -> at that time we cannulated left radial artery multiple times but unable to feed wire.  Will try right radial. If unable to gain access can stick aorto-bifem graft.   2. CAD/Elevated Troponin  -s/p stenting of RCA in 2001, BMS to LAD in 2009 with total occlusion of previously placed RCA stent noted with collateral flow present. - No s/s ischemia. Catj today.  Cath tomorrow - Continue ASA and statin therapy.   3.PAD -s/p aortobifemoral bypass in 02/2017.  - Followed by vascular surgery.   4. Paroxysmal atrial fibrillation -Had post-op AF after surgery in 02/2017.  - Recurred yesterday.  -Continue IV amio and heparin. Rate slow in the low 50s. Stop amio drip. EKG now.  -May need DC-CV.  5. AKI -Creatinine now stable 1.4-> 1.5->1.5. Stable.  - Had been taking lisinopril AND Entresto, but then STOPPED Entresto due to cost. Unwilling to restart     6. Transaminitis - Abdominal USshowed known gallstones but no acute  findings. -LFTs trending down.   7. NSVT - Goal K > 4.0 and Mg > 2.0.  K 4 today    Length of Stay: 6  Amy Clegg, NP  06/06/2017, 7:40 AM  Advanced Heart Failure Team Pager [phone] (M-F; 7a - 4p)  Please contact [facility] for night-coverage after hours (4p -7a ) and weekends on [email or web address]  Patient seen and examined with Tonye BecketAmy Clegg, NP. We discussed all aspects of the encounter. I agree with the assessment and plan as stated above.   Volume status looks good. Co-ox marginal at 55%. Remains in AF with slow VR on amio.   Plan R/L cath today. With slow VR will stop  IV amio and chang to 200 po daily. Stop digoxin. Continue heparin.  Possible DC-CV on Wednesday if she doesn't convert. (Will not need TEE as AF started in hospital and immediately anti-coagulated).   Arvilla Meres, MD  8:30 AM

## 2017-06-06 NOTE — Progress Notes (Addendum)
Site area: RFA / RFV Site Prior to Removal:  Level 0 Pressure Applied For: 10 min Manual:  Hand held  Patient Status During Pull:  stable Post Pull Site:  Level 0 Post Pull Instructions Given:  yes Post Pull Pulses Present: doppler Dressing Applied:  tegaderm Bedrest begins @ 1050 till 1450 Comments:minimal pressure used in sheath removal.

## 2017-06-06 NOTE — Interval H&P Note (Signed)
History and Physical Interval Note:  06/06/2017 8:55 AM  Charlotte Salas  has presented today for surgery, with the diagnosis of CHF  The various methods of treatment have been discussed with the patient and family. After consideration of risks, benefits and other options for treatment, the patient has consented to  Procedure(s): RIGHT/LEFT HEART CATH AND CORONARY ANGIOGRAPHY (N/A) and possible coronary angioplasty as a surgical intervention .  The patient's history has been reviewed, patient examined, no change in status, stable for surgery.  I have reviewed the patient's chart and labs.  Questions were answered to the patient's satisfaction.     Daniel Bensimhon

## 2017-06-06 NOTE — H&P (View-Only) (Signed)
Advanced Heart Failure Rounding Note  PCP:  Primary Cardiologist: Dr Leory PlowmanBenismhon  Subjective:    Yesterday lasix stopped. Continues on amio 30 mg per hour.  Breathing ok.   Anxious to get cath done. Denies SOB/CP.    Objective:   Weight Range: 129 lb 9.6 oz (58.8 kg) Body mass index is 25.31 kg/m.   Vital Signs:   Temp:  [97.8 F (36.6 C)-98.3 F (36.8 C)] 97.8 F (36.6 C) (12/24 0354) Pulse Rate:  [64-106] 71 (12/24 0354) Resp:  [18] 18 (12/24 0354) BP: (104-125)/(53-65) 116/59 (12/24 0354) SpO2:  [97 %] 97 % (12/24 0354) Weight:  [129 lb 9.6 oz (58.8 kg)] 129 lb 9.6 oz (58.8 kg) (12/24 0354) Last BM Date: 06/04/17  Weight change: Filed Weights   06/04/17 0500 06/05/17 0700 06/06/17 0354  Weight: 133 lb (60.3 kg) 128 lb 14.4 oz (58.5 kg) 129 lb 9.6 oz (58.8 kg)    Intake/Output:   Intake/Output Summary (Last 24 hours) at 06/06/2017 0740 Last data filed at 06/06/2017 0357 Gross per 24 hour  Intake 630.13 ml  Output 525 ml  Net 105.13 ml      Physical Exam    General:  Appears chronically ill. No resp difficulty HEENT: normal Neck: supple. JVP 8-9. Carotids 2+ bilat; no bruits. No lymphadenopathy or thryomegaly appreciated. Cor: PMI nondisplaced. Irregular rate & rhythm. No rubs, gallops or murmurs. Lungs: clear Abdomen: soft, nontender, nondistended. No hepatosplenomegaly. No bruits or masses. Good bowel sounds. Extremities: no cyanosis, clubbing, rash, edema. RUE PICC Neuro: alert & orientedx3, cranial nerves grossly intact. moves all 4 extremities w/o difficulty. Affect pleasant   Telemetry   A fib 50s personally reviewed    EKG    N/A  Labs    CBC Recent Labs    06/05/17 0332 06/06/17 0307  WBC 9.8 11.1*  HGB 12.0 12.5  HCT 38.4 39.8  MCV 83.8 83.8  PLT 244 268   Basic Metabolic Panel Recent Labs    46/96/2912/23/18 0332 06/06/17 0307  NA 137 134*  K 3.9 4.0  CL 99* 96*  CO2 31 29  GLUCOSE 116* 132*  BUN 43* 36*  CREATININE  1.51* 1.51*  CALCIUM 8.3* 8.7*   Liver Function Tests Recent Labs    06/05/17 0332 06/06/17 0307  AST 36 33  ALT 100* 89*  ALKPHOS 110 113  BILITOT 0.7 0.8  PROT 5.8* 6.5  ALBUMIN 2.8* 3.1*   No results for input(s): LIPASE, AMYLASE in the last 72 hours. Cardiac Enzymes No results for input(s): CKTOTAL, CKMB, CKMBINDEX, TROPONINI in the last 72 hours.  BNP: BNP (last 3 results) Recent Labs    02/14/17 0948 05/30/17 1744  BNP 1,788.7* 2,426.0*    ProBNP (last 3 results) No results for input(s): PROBNP in the last 8760 hours.   D-Dimer No results for input(s): DDIMER in the last 72 hours. Hemoglobin A1C No results for input(s): HGBA1C in the last 72 hours. Fasting Lipid Panel No results for input(s): CHOL, HDL, LDLCALC, TRIG, CHOLHDL, LDLDIRECT in the last 72 hours. Thyroid Function Tests No results for input(s): TSH, T4TOTAL, T3FREE, THYROIDAB in the last 72 hours.  Invalid input(s): FREET3  Other results:   Imaging    No results found.   Medications:     Scheduled Medications: . aspirin EC  81 mg Oral QPM  . digoxin  0.0625 mg Oral Daily  . insulin aspart  0-15 Units Subcutaneous TID WC  . lisinopril  2.5 mg Oral QHS  .  pregabalin  75 mg Oral QHS  . simvastatin  20 mg Oral q1800  . sodium chloride flush  3 mL Intravenous Q12H  . sodium chloride flush  3 mL Intravenous Q12H  . spironolactone  12.5 mg Oral Daily    Infusions: . sodium chloride    . sodium chloride    . sodium chloride    . amiodarone 30 mg/hr (06/06/17 0054)  . heparin 800 Units/hr (06/05/17 1310)    PRN Medications: sodium chloride, sodium chloride, acetaminophen **OR** acetaminophen, hydrALAZINE, ondansetron **OR** ondansetron (ZOFRAN) IV, sodium chloride flush, sodium chloride flush, sodium chloride flush, traMADol    Patient Profile   Charlotte Salas is a 71 y.o. female with PMH CAD (s/p stenting of RCA in 2001, BMS to LAD in 2009 with total occlusion of previously  placed RCA stent noted with collateral flow present), ICM, Systolic CHF, PAD s/p aortobifemoral bypass in 02/2017, post op afib, HTN, HLD, and AAA.   [facility] with worsening dyspnea. Diuresed on IV lasix. Coox 54% on 06/01/17 so [facility] evaluation and treatment by HF team.     Assessment/Plan   1. Acute on Chronic Combined Systolic and Diastolic CHF -> Biventricular failure by Echo.  - EF worsened to 10% from  EF of 30-35% by echo in 02/2017. Unclear etiology. Now with biventricular dysfunction  - on digoxin 0.0625 mg. Check dig level in am.  - Volume status stable. Keep off lasix until after cath.  - Stop spiro. Stop lisinopril  - Off b-blocker due to acute decompensation.Marland Kitchen.    Drue Stager- Refuses Entresto -Per Dr  Gala RomneyBensimhon had long talk with her and multiple family members yesterday (including her daughter) about her situation and possible poor prognosis. Plan to reattempt cath today. Previously tried left radial approach in 9/18 -> at that time we cannulated left radial artery multiple times but unable to feed wire.  Will try right radial. If unable to gain access can stick aorto-bifem graft.   2. CAD/Elevated Troponin  -s/p stenting of RCA in 2001, BMS to LAD in 2009 with total occlusion of previously placed RCA stent noted with collateral flow present. - No s/s ischemia. Catj today.  Cath tomorrow - Continue ASA and statin therapy.   3.PAD -s/p aortobifemoral bypass in 02/2017.  - Followed by vascular surgery.   4. Paroxysmal atrial fibrillation -Had post-op AF after surgery in 02/2017.  - Recurred yesterday.  -Continue IV amio and heparin. Rate slow in the low 50s. Stop amio drip. EKG now.  -May need DC-CV.  5. AKI -Creatinine now stable 1.4-> 1.5->1.5. Stable.  - Had been taking lisinopril AND Entresto, but then STOPPED Entresto due to cost. Unwilling to restart     6. Transaminitis - Abdominal USshowed known gallstones but no acute  findings. -LFTs trending down.   7. NSVT - Goal K > 4.0 and Mg > 2.0.  K 4 today    Length of Stay: 6  Amy Clegg, NP  06/06/2017, 7:40 AM  Advanced Heart Failure Team Pager [phone] (M-F; 7a - 4p)  Please contact [facility] for night-coverage after hours (4p -7a ) and weekends on [email or web address]  Patient seen and examined with Tonye BecketAmy Clegg, NP. We discussed all aspects of the encounter. I agree with the assessment and plan as stated above.   Volume status looks good. Co-ox marginal at 55%. Remains in AF with slow VR on amio.   Plan R/L cath today. With slow VR will stop  IV amio and chang to 200 po daily. Stop digoxin. Continue heparin.  Possible DC-CV on Wednesday if she doesn't convert. (Will not need TEE as AF started in hospital and immediately anti-coagulated).   Arvilla Meres, MD  8:30 AM

## 2017-06-06 NOTE — Progress Notes (Signed)
Pt refuses bed alarm tonight. Pt able to get up to bedside commode independently. Will continue to monitor.

## 2017-06-07 DIAGNOSIS — N170 Acute kidney failure with tubular necrosis: Secondary | ICD-10-CM

## 2017-06-07 DIAGNOSIS — N183 Chronic kidney disease, stage 3 (moderate): Secondary | ICD-10-CM

## 2017-06-07 LAB — CBC
HCT: 39.5 % (ref 36.0–46.0)
HEMOGLOBIN: 12.6 g/dL (ref 12.0–15.0)
MCH: 26.5 pg (ref 26.0–34.0)
MCHC: 31.9 g/dL (ref 30.0–36.0)
MCV: 83 fL (ref 78.0–100.0)
PLATELETS: 242 10*3/uL (ref 150–400)
RBC: 4.76 MIL/uL (ref 3.87–5.11)
RDW: 14.8 % (ref 11.5–15.5)
WBC: 9.3 10*3/uL (ref 4.0–10.5)

## 2017-06-07 LAB — BASIC METABOLIC PANEL
ANION GAP: 9 (ref 5–15)
BUN: 32 mg/dL — ABNORMAL HIGH (ref 6–20)
CALCIUM: 8.8 mg/dL — AB (ref 8.9–10.3)
CO2: 27 mmol/L (ref 22–32)
CREATININE: 1.4 mg/dL — AB (ref 0.44–1.00)
Chloride: 98 mmol/L — ABNORMAL LOW (ref 101–111)
GFR, EST AFRICAN AMERICAN: 42 mL/min — AB (ref >60)
GFR, EST NON AFRICAN AMERICAN: 36 mL/min — AB (ref >60)
GLUCOSE: 108 mg/dL — AB (ref 65–99)
Potassium: 4.1 mmol/L (ref 3.5–5.1)
Sodium: 134 mmol/L — ABNORMAL LOW (ref 135–145)

## 2017-06-07 LAB — GLUCOSE, CAPILLARY
GLUCOSE-CAPILLARY: 235 mg/dL — AB (ref 65–99)
Glucose-Capillary: 121 mg/dL — ABNORMAL HIGH (ref 65–99)
Glucose-Capillary: 164 mg/dL — ABNORMAL HIGH (ref 65–99)
Glucose-Capillary: 171 mg/dL — ABNORMAL HIGH (ref 65–99)

## 2017-06-07 LAB — HEPARIN LEVEL (UNFRACTIONATED): HEPARIN UNFRACTIONATED: 0.39 [IU]/mL (ref 0.30–0.70)

## 2017-06-07 LAB — COOXEMETRY PANEL
CARBOXYHEMOGLOBIN: 1.1 % (ref 0.5–1.5)
METHEMOGLOBIN: 1 % (ref 0.0–1.5)
O2 SAT: 66.8 %
Total hemoglobin: 12.4 g/dL (ref 12.0–16.0)

## 2017-06-07 LAB — DIGOXIN LEVEL: DIGOXIN LVL: 0.2 ng/mL — AB (ref 0.8–2.0)

## 2017-06-07 MED ORDER — CLOPIDOGREL BISULFATE 75 MG PO TABS: 75.0000 mg | ORAL_TABLET | Freq: Every day | ORAL | Status: DC

## 2017-06-07 MED ORDER — SODIUM CHLORIDE 0.9 % IV SOLN: 250.0000 mL | INTRAVENOUS | Status: DC | PRN

## 2017-06-07 MED ORDER — SODIUM CHLORIDE 0.9% FLUSH: 3.0000 mL | Freq: Two times a day (BID) | INTRAVENOUS | Status: DC

## 2017-06-07 MED ORDER — SODIUM CHLORIDE 0.9% FLUSH: 3.0000 mL | INTRAVENOUS | Status: DC | PRN

## 2017-06-07 MED ORDER — CLOPIDOGREL BISULFATE 75 MG PO TABS
300.0000 mg | ORAL_TABLET | Freq: Once | ORAL | Status: AC
  Administered 2017-06-08: 300 mg via ORAL
  Filled 2017-06-07: qty 4

## 2017-06-07 MED ORDER — SODIUM CHLORIDE 0.9 % IV SOLN
INTRAVENOUS | Status: DC
  Administered 2017-06-08: 07:00:00 via INTRAVENOUS

## 2017-06-07 MED ORDER — TRAZODONE HCL 50 MG PO TABS
50.0000 mg | ORAL_TABLET | Freq: Every evening | ORAL | Status: DC | PRN
  Administered 2017-06-07 – 2017-06-09 (×3): 50 mg via ORAL
  Filled 2017-06-07 (×3): qty 1

## 2017-06-07 MED ORDER — GUAIFENESIN 100 MG/5ML PO SOLN
5.0000 mL | ORAL | Status: DC | PRN
  Administered 2017-06-07: 100 mg via ORAL
  Filled 2017-06-07 (×2): qty 5

## 2017-06-07 MED ORDER — ASPIRIN 81 MG PO CHEW
81.0000 mg | CHEWABLE_TABLET | ORAL | Status: AC
  Administered 2017-06-08: 81 mg via ORAL
  Filled 2017-06-07: qty 1

## 2017-06-07 NOTE — H&P (View-Only) (Signed)
Advanced Heart Failure Rounding Note  PCP:  Primary Cardiologist: Dr Leory PlowmanBenismhon  Subjective:    Cath yesterday with 3v CAD. Seen by Dr. Donata ClayVan Trigt - not candidate for CABG with re-do sternotomy (previous ASD/VSD repairs)   Denies CP. Breathing ok. Still in AFL. Remains on amio.  Co-ox 67. Off lasix. Weight up 1 pound  Objective:   Weight Range: 59.3 kg (130 lb 11.2 oz) Body mass index is 25.53 kg/m.   Vital Signs:   Temp:  [97.5 F (36.4 C)-98.4 F (36.9 C)] 98.4 F (36.9 C) (12/25 0454) Pulse Rate:  [0-75] 74 (12/25 0454) Resp:  [0-157] 18 (12/25 0454) BP: (84-146)/(45-74) 146/74 (12/25 0454) SpO2:  [0 %-100 %] 96 % (12/25 0454) Weight:  [59.3 kg (130 lb 11.2 oz)] 59.3 kg (130 lb 11.2 oz) (12/25 0454) Last BM Date: 06/06/17  Weight change: Filed Weights   06/05/17 0700 06/06/17 0354 06/07/17 0454  Weight: 58.5 kg (128 lb 14.4 oz) 58.8 kg (129 lb 9.6 oz) 59.3 kg (130 lb 11.2 oz)    Intake/Output:   Intake/Output Summary (Last 24 hours) at 06/07/2017 0659 Last data filed at 06/07/2017 0458 Gross per 24 hour  Intake 545.58 ml  Output 950 ml  Net -404.42 ml      Physical Exam    General:  Appears chronically ill. No resp difficulty HEENT: normal Neck: supple. JVP 8-9. Carotids 2+ bilat; no bruits. No lymphadenopathy or thryomegaly appreciated. Cor: PMI nondisplaced. Irregular rate & rhythm. No rubs, gallops or murmurs. Lungs: clear Abdomen: soft, nontender, nondistended. No hepatosplenomegaly. No bruits or masses. Good bowel sounds. Extremities: no cyanosis, clubbing, rash, edema. RUE PICC Neuro: alert & orientedx3, cranial nerves grossly intact. moves all 4 extremities w/o difficulty. Affect pleasant   Telemetry   A fib 50s personally reviewed    EKG    N/A  Labs    CBC Recent Labs    06/06/17 0307 06/07/17 0524  WBC 11.1* 9.3  HGB 12.5 12.6  HCT 39.8 39.5  MCV 83.8 83.0  PLT 268 242   Basic Metabolic Panel Recent Labs     06/06/17 0307 06/07/17 0524  NA 134* 134*  K 4.0 4.1  CL 96* 98*  CO2 29 27  GLUCOSE 132* 108*  BUN 36* 32*  CREATININE 1.51* 1.40*  CALCIUM 8.7* 8.8*   Liver Function Tests Recent Labs    06/05/17 0332 06/06/17 0307  AST 36 33  ALT 100* 89*  ALKPHOS 110 113  BILITOT 0.7 0.8  PROT 5.8* 6.5  ALBUMIN 2.8* 3.1*   No results for input(s): LIPASE, AMYLASE in the last 72 hours. Cardiac Enzymes No results for input(s): CKTOTAL, CKMB, CKMBINDEX, TROPONINI in the last 72 hours.  BNP: BNP (last 3 results) Recent Labs    02/14/17 0948 05/30/17 1744  BNP 1,788.7* 2,426.0*    ProBNP (last 3 results) No results for input(s): PROBNP in the last 8760 hours.   D-Dimer No results for input(s): DDIMER in the last 72 hours. Hemoglobin A1C No results for input(s): HGBA1C in the last 72 hours. Fasting Lipid Panel No results for input(s): CHOL, HDL, LDLCALC, TRIG, CHOLHDL, LDLDIRECT in the last 72 hours. Thyroid Function Tests No results for input(s): TSH, T4TOTAL, T3FREE, THYROIDAB in the last 72 hours.  Invalid input(s): FREET3  Other results:   Imaging    No results found.   Medications:     Scheduled Medications: . amiodarone  200 mg Oral Daily  . aspirin EC  81  mg Oral QPM  . insulin aspart  0-15 Units Subcutaneous TID WC  . pregabalin  75 mg Oral QHS  . simvastatin  20 mg Oral q1800  . sodium chloride flush  3 mL Intravenous Q12H  . sodium chloride flush  3 mL Intravenous Q12H    Infusions: . sodium chloride    . sodium chloride 250 mL (06/06/17 1900)  . heparin 800 Units/hr (06/06/17 1715)    PRN Medications: sodium chloride, sodium chloride, acetaminophen **OR** acetaminophen, hydrALAZINE, ondansetron **OR** ondansetron (ZOFRAN) IV, ondansetron (ZOFRAN) IV, sodium chloride flush, sodium chloride flush, sodium chloride flush, traMADol    Patient Profile   Riley Lammma J Gatta is a 71 y.o. female with PMH CAD (s/p stenting of RCA in 2001, BMS to LAD in  2009 with total occlusion of previously placed RCA stent noted with collateral flow present), ICM, Systolic CHF, PAD s/p aortobifemoral bypass in 02/2017, post op afib, HTN, HLD, and AAA.   [facility] with worsening dyspnea. Diuresed on IV lasix. Coox 54% on 06/01/17 [facility] and treatment by HF team.     Assessment/Plan   1. Acute on Chronic Combined Systolic and Diastolic CHF -> Biventricular failure by Echo.  - EF worsened to 10% from  EF of 30-35% by echo in 02/2017. Unclear etiology. Now with biventricular dysfunction  - Cath 12/24 with 3v CAD. Suspect iCM with possible some component of adriamycin toxicity from previous NHL therapy. Not CABG candidate. Will discuss high-risk PCI with interventional team  - Co-ox ok today. Resume low-dose lisinopril and spiro - Off b-blocker due to acute decompensation..    - Refuses Entresto   2. CAD/Elevated Troponin  -s/p stenting of RCA in 2001, BMS to LAD in 2009 with total occlusion of previously placed RCA stent noted with collateral flow present. - Cath 12/24 with 3v CAD - Seen by TCTs and not candidate for CABG - Will review with interventional team regarding high-risk PCI/rotobaltor of LAD  3.PAD -s/p aortobifemoral bypass in 02/2017.    4. Paroxysmal atrial fibrillation/flutter -Had post-op AF after surgery in 02/2017.  - Recurred this admit - IV amio switched to po amio on 12/24 due to slow HRs - May need DC-CV. Continue heparin. Will await decision on PCI  5. H/o ASD/VSD repair - stable  6. AKI -Creatinine now stable at 1.4   7. Transaminitis/shock liver - Abdominal USshowed known gallstones but no acute findings. -LFTs trending down.   7. NSVT - Goal K > 4.0 and Mg > 2.0.  K 4 today   8. H/o Non-hodgkins lmphoma - in remission   Length of Stay: 7  Arvilla Meresaniel Bensimhon, MD  06/07/2017, 6:59 AM  Advanced Heart Failure Team Pager [phone] (M-F; 7a - 4p)   Please contact [facility] for night-coverage after hours (4p -7a ) and weekends on [email or web address]   Addendum:  Spoke with Dr. [place] who reviewed films. Plan high-risk PCI/rotoblator of LAD tomorrow. Load Plavix.   Arvilla Meresaniel Bensimhon, MD  7:57 AM

## 2017-06-07 NOTE — Progress Notes (Signed)
ANTICOAGULATION CONSULT NOTE   Pharmacy Consult for Heparin Indication: atrial fibrillation  Allergies  Allergen Reactions  . Codeine Nausea Only    Patient Measurements: Height: 5' (152.4 cm) Weight: 130 lb 11.2 oz (59.3 kg)(scale a) IBW/kg (Calculated) : 45.5 Heparin Dosing Weight: 60kg   Vital Signs: Temp: 98.4 F (36.9 C) (12/25 0454) Temp Source: Oral (12/25 0454) BP: 146/74 (12/25 0454) Pulse Rate: 74 (12/25 0454)  Labs: Recent Labs    06/05/17 0332 06/05/17 0852 06/06/17 0307 06/06/17 0320 06/07/17 0524  HGB 12.0  --  12.5  --  12.6  HCT 38.4  --  39.8  --  39.5  PLT 244  --  268  --  242  LABPROT  --   --   --  14.1  --   INR  --   --   --  1.10  --   HEPARINUNFRC  --  0.65  --  0.61 0.39  CREATININE 1.51*  --  1.51*  --  1.40*    Estimated Creatinine Clearance: 28.8 mL/min (A) (by C-G formula based on SCr of 1.4 mg/dL (H)).   Medical History: Past Medical History:  Diagnosis Date  . Atrial septal defect    hx of it  . CAD (coronary artery disease)    s/p prior stenting of the right coronary artery in '01 and non-ST-elevation myocardial infarction in jan '09, with a new bare-metal stent placed in the ostium of the left anterior descending coronary artert and the coronary anatomy as described above with total occlusion of the previously placed right coronary artery stent. EF 35-40%  . CHF (congestive heart failure) (HCC)   . Diabetes mellitus without complication (HCC)   . Hepatitis 1971   " A"  . History of cervical cancer   . History of non-Hodgkin's lymphoma   . HTN (hypertension)   . HTN (hypertension)   . Hyperlipidemia   . Ischemic cardiomyopathy    EF 35-40% by 01/2016 echo  . Myocardial infarction (HCC) 2009, 2000  . Other and unspecified hyperlipidemia   . Peripheral vascular disease (HCC)   . Personal history of malignant neoplasm of cervix uteri   . Personal history of other lymphatic and hematopoietic neoplasm 2002   hodgkins  lymphoma   Assessment: 71 year old female continuing on heparin for new afib. Patient not on anticoagulation prior to admit. CBC wnl. Heparin restarted 8 hours after sheath removal on 12/24. HL therapeutic at 0.39 on 800 units/hr. No interruptions in therapy or signs of bleeding per RN.  Goal of Therapy:  Heparin level 0.3-0.7 units/ml Monitor platelets by anticoagulation protocol: Yes   Plan:  Increase heparin to 900 units/hr Daily heparin level/CBC

## 2017-06-07 NOTE — Progress Notes (Addendum)
  Advanced Heart Failure Rounding Note  PCP:  Primary Cardiologist: Dr Benismhon  Subjective:    Cath yesterday with 3v CAD. Seen by Dr. Van Trigt - not candidate for CABG with re-do sternotomy (previous ASD/VSD repairs)   Denies CP. Breathing ok. Still in AFL. Remains on amio.  Co-ox 67. Off lasix. Weight up 1 pound  Objective:   Weight Range: 59.3 kg (130 lb 11.2 oz) Body mass index is 25.53 kg/m.   Vital Signs:   Temp:  [97.5 F (36.4 C)-98.4 F (36.9 C)] 98.4 F (36.9 C) (12/25 0454) Pulse Rate:  [0-75] 74 (12/25 0454) Resp:  [0-157] 18 (12/25 0454) BP: (84-146)/(45-74) 146/74 (12/25 0454) SpO2:  [0 %-100 %] 96 % (12/25 0454) Weight:  [59.3 kg (130 lb 11.2 oz)] 59.3 kg (130 lb 11.2 oz) (12/25 0454) Last BM Date: 06/06/17  Weight change: Filed Weights   06/05/17 0700 06/06/17 0354 06/07/17 0454  Weight: 58.5 kg (128 lb 14.4 oz) 58.8 kg (129 lb 9.6 oz) 59.3 kg (130 lb 11.2 oz)    Intake/Output:   Intake/Output Summary (Last 24 hours) at 06/07/2017 0659 Last data filed at 06/07/2017 0458 Gross per 24 hour  Intake 545.58 ml  Output 950 ml  Net -404.42 ml      Physical Exam    General:  Appears chronically ill. No resp difficulty HEENT: normal Neck: supple. JVP 8-9. Carotids 2+ bilat; no bruits. No lymphadenopathy or thryomegaly appreciated. Cor: PMI nondisplaced. Irregular rate & rhythm. No rubs, gallops or murmurs. Lungs: clear Abdomen: soft, nontender, nondistended. No hepatosplenomegaly. No bruits or masses. Good bowel sounds. Extremities: no cyanosis, clubbing, rash, edema. RUE PICC Neuro: alert & orientedx3, cranial nerves grossly intact. moves all 4 extremities w/o difficulty. Affect pleasant   Telemetry   A fib 50s personally reviewed    EKG    N/A  Labs    CBC Recent Labs    06/06/17 0307 06/07/17 0524  WBC 11.1* 9.3  HGB 12.5 12.6  HCT 39.8 39.5  MCV 83.8 83.0  PLT 268 242   Basic Metabolic Panel Recent Labs     06/06/17 0307 06/07/17 0524  NA 134* 134*  K 4.0 4.1  CL 96* 98*  CO2 29 27  GLUCOSE 132* 108*  BUN 36* 32*  CREATININE 1.51* 1.40*  CALCIUM 8.7* 8.8*   Liver Function Tests Recent Labs    06/05/17 0332 06/06/17 0307  AST 36 33  ALT 100* 89*  ALKPHOS 110 113  BILITOT 0.7 0.8  PROT 5.8* 6.5  ALBUMIN 2.8* 3.1*   No results for input(s): LIPASE, AMYLASE in the last 72 hours. Cardiac Enzymes No results for input(s): CKTOTAL, CKMB, CKMBINDEX, TROPONINI in the last 72 hours.  BNP: BNP (last 3 results) Recent Labs    02/14/17 0948 05/30/17 1744  BNP 1,788.7* 2,426.0*    ProBNP (last 3 results) No results for input(s): PROBNP in the last 8760 hours.   D-Dimer No results for input(s): DDIMER in the last 72 hours. Hemoglobin A1C No results for input(s): HGBA1C in the last 72 hours. Fasting Lipid Panel No results for input(s): CHOL, HDL, LDLCALC, TRIG, CHOLHDL, LDLDIRECT in the last 72 hours. Thyroid Function Tests No results for input(s): TSH, T4TOTAL, T3FREE, THYROIDAB in the last 72 hours.  Invalid input(s): FREET3  Other results:   Imaging    No results found.   Medications:     Scheduled Medications: . amiodarone  200 mg Oral Daily  . aspirin EC  81   mg Oral QPM  . insulin aspart  0-15 Units Subcutaneous TID WC  . pregabalin  75 mg Oral QHS  . simvastatin  20 mg Oral q1800  . sodium chloride flush  3 mL Intravenous Q12H  . sodium chloride flush  3 mL Intravenous Q12H    Infusions: . sodium chloride    . sodium chloride 250 mL (06/06/17 1900)  . heparin 800 Units/hr (06/06/17 1715)    PRN Medications: sodium chloride, sodium chloride, acetaminophen **OR** acetaminophen, hydrALAZINE, ondansetron **OR** ondansetron (ZOFRAN) IV, ondansetron (ZOFRAN) IV, sodium chloride flush, sodium chloride flush, sodium chloride flush, traMADol    Patient Profile   Charlotte Salas is a 71 y.o. female with PMH CAD (s/p stenting of RCA in 2001, BMS to LAD in  2009 with total occlusion of previously placed RCA stent noted with collateral flow present), ICM, Systolic CHF, PAD s/p aortobifemoral bypass in 02/2017, post op afib, HTN, HLD, and AAA.   [facility] with worsening dyspnea. Diuresed on IV lasix. Coox 54% on 06/01/17 so [facility] evaluation and treatment by HF team.     Assessment/Plan   1. Acute on Chronic Combined Systolic and Diastolic CHF -> Biventricular failure by Echo.  - EF worsened to 10% from  EF of 30-35% by echo in 02/2017. Unclear etiology. Now with biventricular dysfunction  - Cath 12/24 with 3v CAD. Suspect iCM with possible some component of adriamycin toxicity from previous NHL therapy. Not CABG candidate. Will discuss high-risk PCI with interventional team  - Co-ox ok today. Resume low-dose lisinopril and spiro - Off b-blocker due to acute decompensation..    - Refuses Entresto   2. CAD/Elevated Troponin  -s/p stenting of RCA in 2001, BMS to LAD in 2009 with total occlusion of previously placed RCA stent noted with collateral flow present. - Cath 12/24 with 3v CAD - Seen by TCTs and not candidate for CABG - Will review with interventional team regarding high-risk PCI/rotobaltor of LAD  3.PAD -s/p aortobifemoral bypass in 02/2017.    4. Paroxysmal atrial fibrillation/flutter -Had post-op AF after surgery in 02/2017.  - Recurred this admit - IV amio switched to po amio on 12/24 due to slow HRs - May need DC-CV. Continue heparin. Will await decision on PCI  5. H/o ASD/VSD repair - stable  6. AKI -Creatinine now stable at 1.4   7. Transaminitis/shock liver - Abdominal USshowed known gallstones but no acute findings. -LFTs trending down.   7. NSVT - Goal K > 4.0 and Mg > 2.0.  K 4 today   8. H/o Non-hodgkins lmphoma - in remission   Length of Stay: 7  Charlotte Meresaniel Chari Parmenter, MD  06/07/2017, 6:59 AM  Advanced Heart Failure Team Pager [phone] (M-F; 7a - 4p)   Please contact [facility] for night-coverage after hours (4p -7a ) and weekends on [email or web address]   Addendum:  Spoke with Dr. Swazil[place]Jordan who reviewed films. Plan high-risk PCI/rotoblator of LAD tomorrow. Load Plavix.   Charlotte Meresaniel Heena Woodbury, MD  7:57 AM

## 2017-06-07 NOTE — Progress Notes (Signed)
[facility] PROGRESS NOTE  Charlotte Salas ZOX:[id number] DOB: 1946/04/28 DOA: 05/30/2017  PCP: Stoney BangWinikur, Melinda, FNP  Brief History/Interval Summary: 71 year old female with past medical history of coronary artery disease, chronic systolic CHF with previously known EF of 30-35%, hypertension, diabetes mellitus, peripheral artery disease presented with 2-day history of shortness of breath, orthopnea, lower extremity edema.  Patient [place] to the [place] in [place], [place] from here she was sent over here for further management.  Patient seen by heart failure team.  She was diuresed.  She underwent cardiac catheterization which revealed triple-vessel disease.  She was evaluated by cardiothoracic surgery and not considered a candidate for surgical intervention.  Reason for Visit: Acute systolic CHF.  Atrial fibrillation  Consultants: Cardiology.  Cardiothoracic surgery.  Procedures:   Transthoracic echocardiogram Study Conclusions  - Left ventricle: The cavity size was mildly dilated. Wall   thickness was at the upper limits of normal. Systolic function   was severely reduced. The estimated ejection fraction was in the   range of 15% to 20%. Diffuse hypokinesis. There is akinesis of   the inferoseptal myocardium. The study is not technically   sufficient to allow evaluation of LV diastolic function. - Ventricular septum: Septal motion showed abnormal function and   dyssynergy. - Aortic valve: Moderately calcified annulus. Trileaflet. - Mitral valve: Mildly calcified annulus. There was moderate   regurgitation. - Left atrium: The atrium was severely dilated. - Right ventricle: The cavity size was mildly dilated. Systolic   function was moderately to severely reduced. - Right atrium: The atrium was mildly to moderately dilated.   Central venous pressure (est): 8 mm Hg. - Tricuspid valve: There was severe regurgitation. - Pulmonary arteries: PA peak  pressure: 51 mm Hg (S). - Pericardium, extracardiac: There was no pericardial effusion.  Impressions:  - Upper normal LV wall thickness with mild chamber dilatation and   LVEF approximately 15-20%. There is diffuse hypokinesis with   regional variation and akinesis of the inferoseptal wall and   septal dyssynergy. Indeterminate diastolic function, but appears   to be restrictive filling pattern. Severe left atrial   enlargement. Mildly calcified mitral annulus with at least   moderate mitral regurgitation. moderate aortic annular   calcification. Moderately dilated right ventricle with moderately   to severely reduced contraction. Mild to moderate right atrial   enlargement. Severe tricuspid regurgitation with PASP estimated   at 51 mmHg.  Cardiac catheterization Conclusion     Mid RCA to Dist RCA lesion is 100% stenosed.  Prox RCA lesion is 70% stenosed.  Mid LM lesion is 20% stenosed.  Prox LAD lesion is 95% stenosed.  Prox Cx lesion is 95% stenosed.  Ost 1st Diag to 1st Diag lesion is 40% stenosed.     Antibiotics: None  Subjective/Interval History: Patient states that her breathing is better.  Remains somewhat concerned about the blockages that were found on the cardiac catheterization.  Denies any chest pain.  No nausea or vomiting.    ROS: Patient denies any headaches.  Objective:  Vital Signs  Vitals:   06/06/17 1520 06/06/17 1629 06/06/17 2002 06/07/17 0454  BP:   137/62 (!) 146/74  Pulse:  75 73 74  Resp:   18 18  Temp: (!) 97.5 F (36.4 C)  98 F (36.7 C) 98.4 F (36.9 C)  TempSrc: Oral  Oral Oral  SpO2:  100% 99% 96%  Weight:    59.3 kg (130 lb 11.2 oz)  Height:  Intake/Output Summary (Last 24 hours) at 06/07/2017 0821 Last data filed at 06/07/2017 0459 Gross per 24 hour  Intake 653.58 ml  Output 950 ml  Net -296.42 ml   Filed Weights   06/05/17 0700 06/06/17 0354 06/07/17 0454  Weight: 58.5 kg (128 lb 14.4 oz) 58.8 kg (129  lb 9.6 oz) 59.3 kg (130 lb 11.2 oz)    General appearance: She is awake and alert.  In no distress. Resp: Clear to auscultation bilaterally.  No wheezing rales or rhonchi.  Normal effort. Cardio: S1-S2 is irregularly irregular.  No S3-S4.  Systolic murmur appreciated over the precordium. GI: Soft.  Nontender nondistended.  Bowel sounds are present.  No masses or organomegaly Extremities: Lower extremity edema has improved. Neurologic: No focal deficits.  Lab Results:  Data Reviewed: I have personally reviewed following labs and imaging studies  CBC: Recent Labs  Lab 06/02/17 0732 06/03/17 0451 06/05/17 0332 06/06/17 0307 06/07/17 0524  WBC 8.7 8.6 9.8 11.1* 9.3  NEUTROABS 4.3 4.7  --   --   --   HGB 12.7 11.5* 12.0 12.5 12.6  HCT 39.9 36.4 38.4 39.8 39.5  MCV 84.9 84.1 83.8 83.8 83.0  PLT 192 186 244 268 242    Basic Metabolic Panel: Recent Labs  Lab 06/01/17 0738  06/03/17 0451 06/04/17 0518 06/05/17 0332 06/06/17 0307 06/07/17 0524  NA  --    < > 136 138 137 134* 134*  K  --    < > 3.2* 4.5 3.9 4.0 4.1  CL  --    < > 95* 100* 99* 96* 98*  CO2  --    < > 32 32 31 29 27   GLUCOSE  --    < > 81 132* 116* 132* 108*  BUN  --    < > 48* 40* 43* 36* 32*  CREATININE  --    < > 1.51* 1.44* 1.51* 1.51* 1.40*  CALCIUM  --    < > 8.3* 8.4* 8.3* 8.7* 8.8*  MG 2.2  --  2.1  --   --   --   --    < > = values in this interval not displayed.    GFR: Estimated Creatinine Clearance: 28.8 mL/min (A) (by C-G formula based on SCr of 1.4 mg/dL (H)).  Liver Function Tests: Recent Labs  Lab 06/01/17 0416 06/03/17 0451 06/04/17 0518 06/05/17 0332 06/06/17 0307  AST 187* 72* 50* 36 33  ALT 298* 168* 138* 100* 89*  ALKPHOS 143* 129* 127* 110 113  BILITOT 1.0 1.2 0.9 0.7 0.8  PROT 5.6* 5.6* 6.0* 5.8* 6.5  ALBUMIN 2.7* 2.7* 2.7* 2.8* 3.1*     CBG: Recent Labs  Lab 06/06/17 1038 06/06/17 1201 06/06/17 1649 06/06/17 2117 06/07/17 0738  GLUCAP 112* 103* 185* 219* 121*       Radiology Studies: No results found.   Medications:  Scheduled: . amiodarone  200 mg Oral Daily  . aspirin EC  81 mg Oral QPM  . insulin aspart  0-15 Units Subcutaneous TID WC  . pregabalin  75 mg Oral QHS  . simvastatin  20 mg Oral q1800  . sodium chloride flush  3 mL Intravenous Q12H  . sodium chloride flush  3 mL Intravenous Q12H   Continuous: . sodium chloride    . sodium chloride 250 mL (06/06/17 1900)  . heparin 800 Units/hr (06/06/17 1715)   ZOX:WRUEAV chloride, sodium chloride, acetaminophen **OR** acetaminophen, guaiFENesin, hydrALAZINE, ondansetron **OR** ondansetron (ZOFRAN) IV, ondansetron (ZOFRAN)  IV, sodium chloride flush, sodium chloride flush, sodium chloride flush, traMADol, traZODone  Assessment/Plan:  Active Problems:   CAD in native artery   Tobacco abuse   AAA (abdominal aortic aneurysm) (HCC)   Acute on chronic systolic (congestive) heart failure (HCC)   PAF (paroxysmal atrial fibrillation) (HCC)   Acute renal failure superimposed on stage 3 chronic kidney disease (HCC)   Transaminasemia   CHF (congestive heart failure) (HCC)   AKI (acute kidney injury) (HCC)    Acute on chronic systolic congestive heart failure Ejection fraction noted to be about 15-20% based on echocardiogram done during this hospitalization.  Heart failure team continues to follow.  Patient was placed on intravenous diuretics.  She has been diuresing well.  Diuretics are on hold for cardiac catheterization/PTCI which is planned for tomorrow.  Her ACE inhibitor and spironolactone also held.  Not on beta-blocker due to acute decompensation.  Continue strict ins and outs daily weight.   Patient is stable.  Elevated troponin/Coronary artery disease Thought to be secondary to CHF and acute kidney injury.  Patient did not have any chest pain.   Back in September a coronary angiogram was attempted but could not be done due to access issues.  She underwent cardiac catheterization on  12/24 which does show triple-vessel disease.  Cardiothoracic surgery was consulted however patient not a candidate for surgical intervention.  Plan is to attempt PCI of LAD tomorrow.  Continue aspirin and statin.  History of bare-metal stent to LAD in 2009.  And previous intervention to RCA.  Atrial fibrillation Patient has previous history of same but while she was in sinus rhythm during the early part of this hospitalization.  Converted to atrial fibrillation during this hospitalization.  Started on amiodarone and IV heparin by cardiology.  Acute on chronic kidney disease stage III Baseline creatinine between 0.9-1.2.  Creatinine did go up as high as 1.7.  Creatinine has been improving.  Remains stable.  Continue to monitor urine output.    Transaminitis Most likely due to hepatic congestion from CHF. LFT's have significantly improved.  Almost back to baseline.  Multiple gallstones noted on abdominal ultrasound without cholecystitis.  Liver was noted to be normal in appearance.  Hepatitis panel is negative.    Cholelithiasis Asymptomatic.  Will need outpatient monitoring.  Essential hypertension Blood pressure is reasonably well controlled.  Continue to monitor closely.    History of COPD/tobacco abuse Seems to be stable.  Tobacco cessation was discussed.  Diabetes mellitus type 2 Metformin has been held.  CBGs are reasonably well controlled.  Continue sliding scale insulin coverage.  HbA1c 7.9.  DVT Prophylaxis: On IV heparin Code Status: DNR Family Communication: Discussed with the patient Disposition Plan: Plan is for cardiac catheterization with PCI tomorrow.  Seen by physical therapy and home health has been recommended.    LOS: 7 days   Osvaldo ShipperGokul Kloe Oates  [facility] Pager [phone] 06/07/2017, 8:21 AM  If 7PM-7AM, please contact night-coverage at [email or web address], password [email or web address]

## 2017-06-08 ENCOUNTER — Encounter (HOSPITAL_COMMUNITY)
Admission: EM | Disposition: A | Discharge status: Home Health | Payer: Self-pay | Source: Home / Self Care | Attending: Internal Medicine

## 2017-06-08 ENCOUNTER — Encounter (HOSPITAL_COMMUNITY): Payer: Self-pay | Admitting: Cardiology

## 2017-06-08 HISTORY — PX: CORONARY BALLOON ANGIOPLASTY: CATH118233

## 2017-06-08 HISTORY — PX: CORONARY STENT INTERVENTION: CATH118234

## 2017-06-08 LAB — GLUCOSE, CAPILLARY
GLUCOSE-CAPILLARY: 212 mg/dL — AB (ref 65–99)
Glucose-Capillary: 100 mg/dL — ABNORMAL HIGH (ref 65–99)
Glucose-Capillary: 96 mg/dL (ref 65–99)
Glucose-Capillary: 243 mg/dL — ABNORMAL HIGH (ref 65–99)

## 2017-06-08 LAB — CBC
HEMATOCRIT: 35.5 % — AB (ref 36.0–46.0)
Hemoglobin: 11.1 g/dL — ABNORMAL LOW (ref 12.0–15.0)
MCH: 26.1 pg (ref 26.0–34.0)
MCHC: 31.3 g/dL (ref 30.0–36.0)
MCV: 83.3 fL (ref 78.0–100.0)
PLATELETS: 212 10*3/uL (ref 150–400)
RBC: 4.26 MIL/uL (ref 3.87–5.11)
RDW: 15.1 % (ref 11.5–15.5)
WBC: 7.4 10*3/uL (ref 4.0–10.5)

## 2017-06-08 LAB — BASIC METABOLIC PANEL
Anion gap: 10 (ref 5–15)
BUN: 26 mg/dL — AB (ref 6–20)
CALCIUM: 8.7 mg/dL — AB (ref 8.9–10.3)
CHLORIDE: 102 mmol/L (ref 101–111)
CO2: 23 mmol/L (ref 22–32)
CREATININE: 1.01 mg/dL — AB (ref 0.44–1.00)
GFR calc non Af Amer: 54 mL/min — ABNORMAL LOW (ref >60)
GLUCOSE: 98 mg/dL (ref 65–99)
Potassium: 4 mmol/L (ref 3.5–5.1)
Sodium: 135 mmol/L (ref 135–145)

## 2017-06-08 LAB — COOXEMETRY PANEL
Carboxyhemoglobin: 1.3 % (ref 0.5–1.5)
Methemoglobin: 1.2 % (ref 0.0–1.5)
O2 SAT: 76.6 %
TOTAL HEMOGLOBIN: 11.3 g/dL — AB (ref 12.0–16.0)

## 2017-06-08 LAB — POCT ACTIVATED CLOTTING TIME
Activated Clotting Time: 252 seconds
Activated Clotting Time: 301 seconds
Activated Clotting Time: 428 seconds
Activated Clotting Time: 175 seconds

## 2017-06-08 LAB — HEPARIN LEVEL (UNFRACTIONATED): Heparin Unfractionated: 0.52 IU/mL (ref 0.30–0.70)

## 2017-06-08 SURGERY — CORONARY STENT INTERVENTION
Anesthesia: LOCAL

## 2017-06-08 MED ORDER — HEPARIN SODIUM (PORCINE) 1000 UNIT/ML IJ SOLN
INTRAMUSCULAR | Status: AC
  Filled 2017-06-08: qty 1

## 2017-06-08 MED ORDER — LIDOCAINE HCL (PF) 1 % IJ SOLN
INTRAMUSCULAR | Status: AC
  Filled 2017-06-08: qty 30

## 2017-06-08 MED ORDER — SODIUM CHLORIDE 0.9% FLUSH: 3.0000 mL | INTRAVENOUS | Status: DC | PRN

## 2017-06-08 MED ORDER — ALPRAZOLAM 0.5 MG PO TABS
0.5000 mg | ORAL_TABLET | Freq: Once | ORAL | Status: DC
  Filled 2017-06-08: qty 1

## 2017-06-08 MED ORDER — HEPARIN SODIUM (PORCINE) 1000 UNIT/ML IJ SOLN
INTRAMUSCULAR | Status: DC | PRN
  Administered 2017-06-08: 2000 [IU] via INTRAVENOUS
  Administered 2017-06-08: 6000 [IU] via INTRAVENOUS

## 2017-06-08 MED ORDER — FENTANYL CITRATE (PF) 100 MCG/2ML IJ SOLN
INTRAMUSCULAR | Status: DC | PRN
  Administered 2017-06-08 (×2): 25 ug via INTRAVENOUS

## 2017-06-08 MED ORDER — IOPAMIDOL (ISOVUE-370) INJECTION 76%
INTRAVENOUS | Status: DC | PRN
  Administered 2017-06-08: 120 mL

## 2017-06-08 MED ORDER — NITROGLYCERIN 1 MG/10 ML FOR IR/CATH LAB
INTRA_ARTERIAL | Status: DC | PRN
  Administered 2017-06-08: 100 ug via INTRACORONARY

## 2017-06-08 MED ORDER — SODIUM CHLORIDE 0.9% FLUSH
3.0000 mL | Freq: Two times a day (BID) | INTRAVENOUS | Status: DC
  Administered 2017-06-08 – 2017-06-09 (×3): 3 mL via INTRAVENOUS

## 2017-06-08 MED ORDER — ENOXAPARIN SODIUM 40 MG/0.4ML ~~LOC~~ SOLN
40.0000 mg | SUBCUTANEOUS | Status: DC
  Administered 2017-06-09: 08:00:00 40 mg via SUBCUTANEOUS
  Filled 2017-06-08: qty 0.4

## 2017-06-08 MED ORDER — HEPARIN (PORCINE) IN NACL 2-0.9 UNIT/ML-% IJ SOLN
INTRAMUSCULAR | Status: DC | PRN
  Administered 2017-06-08: 10:00:00

## 2017-06-08 MED ORDER — MIDAZOLAM HCL 2 MG/2ML IJ SOLN
INTRAMUSCULAR | Status: AC
  Filled 2017-06-08: qty 2

## 2017-06-08 MED ORDER — ANGIOPLASTY BOOK
Freq: Once | Status: DC
  Filled 2017-06-08: qty 1

## 2017-06-08 MED ORDER — NITROGLYCERIN 1 MG/10 ML FOR IR/CATH LAB
INTRA_ARTERIAL | Status: AC
  Filled 2017-06-08: qty 10

## 2017-06-08 MED ORDER — VIPERSLIDE LUBRICANT OPTIME
TOPICAL | Status: DC | PRN
  Administered 2017-06-08: 500 mL via SURGICAL_CAVITY

## 2017-06-08 MED ORDER — SODIUM CHLORIDE 0.9 % IV SOLN: 250.0000 mL | INTRAVENOUS | Status: DC | PRN

## 2017-06-08 MED ORDER — HEPARIN (PORCINE) IN NACL 2-0.9 UNIT/ML-% IJ SOLN
INTRAMUSCULAR | Status: AC
  Filled 2017-06-08: qty 1000

## 2017-06-08 MED ORDER — MIDAZOLAM HCL 2 MG/2ML IJ SOLN
INTRAMUSCULAR | Status: DC | PRN
  Administered 2017-06-08 (×2): 1 mg via INTRAVENOUS

## 2017-06-08 MED ORDER — FENTANYL CITRATE (PF) 100 MCG/2ML IJ SOLN
INTRAMUSCULAR | Status: AC
  Filled 2017-06-08: qty 2

## 2017-06-08 MED ORDER — LIDOCAINE HCL (PF) 1 % IJ SOLN
INTRAMUSCULAR | Status: DC | PRN
  Administered 2017-06-08: 15 mL

## 2017-06-08 MED ORDER — IOPAMIDOL (ISOVUE-370) INJECTION 76%
INTRAVENOUS | Status: AC
  Filled 2017-06-08: qty 125

## 2017-06-08 MED ORDER — CLOPIDOGREL BISULFATE 75 MG PO TABS: 75.0000 mg | ORAL_TABLET | Freq: Every day | ORAL | Status: DC

## 2017-06-08 SURGICAL SUPPLY — 25 items
BALLN MAVERICK OTW 1.5X15 (BALLOONS) ×2
BALLN SAPPHIRE 2.0X10 (BALLOONS) ×2
BALLN SAPPHIRE 2.5X12 (BALLOONS) ×2
BALLN SAPPHIRE ~~LOC~~ 3.0X18 (BALLOONS) ×2 IMPLANT
BALLOON MAVERICK OTW 1.5X15 (BALLOONS) ×1 IMPLANT
BALLOON SAPPHIRE 2.0X10 (BALLOONS) ×1 IMPLANT
BALLOON SAPPHIRE 2.5X12 (BALLOONS) ×1 IMPLANT
CATH VISTA GUIDE 6FR XBLAD3.5 (CATHETERS) ×2 IMPLANT
COVER PRB 48X5XTLSCP FOLD TPE (BAG) ×1 IMPLANT
COVER PROBE 5X48 (BAG) ×1
CROWN DIAMONDBACK CLASSIC 1.25 (BURR) ×2 IMPLANT
ELECT DEFIB PAD ADLT CADENCE (PAD) ×2 IMPLANT
KIT ENCORE 26 ADVANTAGE (KITS) ×4 IMPLANT
KIT HEART LEFT (KITS) ×2 IMPLANT
LUBRICANT VIPERSLIDE CORONARY (MISCELLANEOUS) ×2 IMPLANT
PACK CARDIAC CATHETERIZATION (CUSTOM PROCEDURE TRAY) ×2 IMPLANT
SHEATH PINNACLE 6F 10CM (SHEATH) ×2 IMPLANT
STENT SYNERGY DES 2.5X32 (Permanent Stent) ×2 IMPLANT
TRANSDUCER W/STOPCOCK (MISCELLANEOUS) ×2 IMPLANT
TUBING CIL FLEX 10 FLL-RA (TUBING) ×2 IMPLANT
WIRE ASAHI PROWATER 180CM (WIRE) ×2 IMPLANT
WIRE ASAHI PROWATER 300CM (WIRE) ×2 IMPLANT
WIRE EMERALD 3MM-J .035X150CM (WIRE) ×2 IMPLANT
WIRE MARVEL STR TIP 190CM (WIRE) ×2 IMPLANT
WIRE VIPER ADVANCE COR .012TIP (WIRE) ×2 IMPLANT

## 2017-06-08 NOTE — Progress Notes (Signed)
ANTICOAGULATION CONSULT NOTE   Pharmacy Consult for Heparin Indication: atrial fibrillation  Allergies  Allergen Reactions  . Codeine Nausea Only    Patient Measurements: Height: 5' (152.4 cm) Weight: 131 lb 3.2 oz (59.5 kg) IBW/kg (Calculated) : 45.5 Heparin Dosing Weight: 60kg   Vital Signs: Temp: 98.2 F (36.8 C) (12/26 0516) Temp Source: Oral (12/26 0516) BP: 116/50 (12/26 0516) Pulse Rate: 71 (12/26 0516)  Labs: Recent Labs    06/06/17 0307 06/06/17 0320 06/07/17 0524 06/08/17 0553  HGB 12.5  --  12.6 11.1*  HCT 39.8  --  39.5 35.5*  PLT 268  --  242 212  LABPROT  --  14.1  --   --   INR  --  1.10  --   --   HEPARINUNFRC  --  0.61 0.39 0.52  CREATININE 1.51*  --  1.40* 1.01*    Estimated Creatinine Clearance: 40 mL/min (A) (by C-G formula based on SCr of 1.01 mg/dL (H)).   Medical History: Past Medical History:  Diagnosis Date  . Atrial septal defect    hx of it  . CAD (coronary artery disease)    s/p prior stenting of the right coronary artery in '01 and non-ST-elevation myocardial infarction in jan '09, with a new bare-metal stent placed in the ostium of the left anterior descending coronary artert and the coronary anatomy as described above with total occlusion of the previously placed right coronary artery stent. EF 35-40%  . CHF (congestive heart failure) (HCC)   . Diabetes mellitus without complication (HCC)   . Hepatitis 1971   " A"  . History of cervical cancer   . History of non-Hodgkin's lymphoma   . HTN (hypertension)   . HTN (hypertension)   . Hyperlipidemia   . Ischemic cardiomyopathy    EF 35-40% by 01/2016 echo  . Myocardial infarction (HCC) 2009, 2000  . Other and unspecified hyperlipidemia   . Peripheral vascular disease (HCC)   . Personal history of malignant neoplasm of cervix uteri   . Personal history of other lymphatic and hematopoietic neoplasm 2002   hodgkins lymphoma   Assessment: 71 year old female continuing on  heparin for new afib. Patient not on anticoagulation prior to admit.  Heparin restarted post cath12/24 and noted for plans for PCI (not a CABG candidate). HL therapeutic at 0.52 on 900 units/hr.   Goal of Therapy:  Heparin level 0.3-0.7 units/ml Monitor platelets by anticoagulation protocol: Yes   Plan:  No heparin changes needed Daily heparin level/CBC Will follow plans post cath  Harland Germanndrew Orly Quimby, Pharm D 06/08/2017 8:41 AM

## 2017-06-08 NOTE — Progress Notes (Signed)
HS CBG 212. No HS Insulin scale. Dr Donnamae JudeLo Verde made aware on CBG result. No new orders at this time.

## 2017-06-08 NOTE — Progress Notes (Signed)
Removed right femoral arterial sheath at 1335.  Light pressure applied per protocol for femoral graft puncture.  Unable to control bleeding despite attempts to reposition/increase pressure application.  Called for assistance and Dr. Gala RomneyBensimhon, Chip Dareen PianoAnderson RN, and Victorino DecemberLaura Cox RN responded.  Dr. Gala RomneyBensimhon, and then Chip Dareen Pianonderson relieved me and assumed care of the site.  Groin site was stabilized, and has remained a level zero since that time.

## 2017-06-08 NOTE — Progress Notes (Signed)
Patient has been in NSR since shift change, this morning per CCMD patient was bradycardic in low 40-'s, high 30's occasionally. Patient asymptomatic/asleep.  Will continue to monitor.  Alanah Sakuma, RN

## 2017-06-08 NOTE — Progress Notes (Signed)
Patient stated, and daughter, Charlotte Salas, witnessed, that she wishes to be a full code status, and wants the DNR bracelet removed.  Dr. Rito EhrlichKrishnan notified.

## 2017-06-08 NOTE — Progress Notes (Signed)
Site area: RFA Site Prior to Removal:  Level 0 Pressure Applied For:20 min Manual:   Light manual pressure Patient Status During Pull:  stable Post Pull Site:  Level 0 Post Pull Instructions Given: yes  Post Pull Pulses Present: by doppler Dressing Applied:  tegaderm Bedrest begins @ 1400 Comments: Relieved Dr Gala RomneyBensimhon Pulses audible by doppler post hold

## 2017-06-08 NOTE — Psychosocial Assessment (Signed)
Patient has been NPO after midnight, ready for procedure.  Plavix 300 mg per pharmacy and order was given. Plavix  75 mg scheduled for 10 AM should be verified with MD  Per pharmacy since loading dose is already given. Day shift nurse informed.  Will continue to monitor.  Meliana Canner, RN

## 2017-06-08 NOTE — Progress Notes (Addendum)
[facility] PROGRESS NOTE  CORALYN ROSELLI ZOX:[id number] DOB: [birth year] DOA: 05/30/2017  PCP: Stoney Bang, FNP  Brief History/Interval Summary: 71 year old female with past medical history of coronary artery disease, chronic systolic CHF with previously known EF of 30-35%, hypertension, diabetes mellitus, peripheral artery disease presented with 2-day history of shortness of breath, orthopnea, lower extremity edema.  Patient initially presented to the hospital in [place], [place] from here she was sent over here for further management.  Patient seen by heart failure team.  She was diuresed.  She underwent cardiac catheterization which revealed triple-vessel disease.  She was evaluated by cardiothoracic surgery and not considered a candidate for surgical intervention.  Reason for Visit: Acute systolic CHF.  Atrial fibrillation  Consultants: Cardiology.  Cardiothoracic surgery.  Procedures:   Transthoracic echocardiogram Study Conclusions  - Left ventricle: The cavity size was mildly dilated. Wall   thickness was at the upper limits of normal. Systolic function   was severely reduced. The estimated ejection fraction was in the   range of 15% to 20%. Diffuse hypokinesis. There is akinesis of   the inferoseptal myocardium. The study is not technically   sufficient to allow evaluation of LV diastolic function. - Ventricular septum: Septal motion showed abnormal function and   dyssynergy. - Aortic valve: Moderately calcified annulus. Trileaflet. - Mitral valve: Mildly calcified annulus. There was moderate   regurgitation. - Left atrium: The atrium was severely dilated. - Right ventricle: The cavity size was mildly dilated. Systolic   function was moderately to severely reduced. - Right atrium: The atrium was mildly to moderately dilated.   Central venous pressure (est): 8 mm Hg. - Tricuspid valve: There was severe regurgitation. - Pulmonary arteries: PA peak  pressure: 51 mm Hg (S). - Pericardium, extracardiac: There was no pericardial effusion.  Impressions:  - Upper normal LV wall thickness with mild chamber dilatation and   LVEF approximately 15-20%. There is diffuse hypokinesis with   regional variation and akinesis of the inferoseptal wall and   septal dyssynergy. Indeterminate diastolic function, but appears   to be restrictive filling pattern. Severe left atrial   enlargement. Mildly calcified mitral annulus with at least   moderate mitral regurgitation. moderate aortic annular   calcification. Moderately dilated right ventricle with moderately   to severely reduced contraction. Mild to moderate right atrial   enlargement. Severe tricuspid regurgitation with PASP estimated   at 51 mmHg.  Cardiac catheterization 12/24 Conclusion     Mid RCA to Dist RCA lesion is 100% stenosed.  Prox RCA lesion is 70% stenosed.  Mid LM lesion is 20% stenosed.  Prox LAD lesion is 95% stenosed.  Prox Cx lesion is 95% stenosed.  Ost 1st Diag to 1st Diag lesion is 40% stenosed.   Cardiac Catheterization 12/26 Conclusion     Prox LAD lesion is 95% stenosed.  A drug-eluting stent was successfully placed using a STENT SYNERGY DES 2.5X32.  Post intervention, there is a 0% residual stenosis.  Prox Cx lesion is 95% stenosed.  Balloon angioplasty was performed using a BALLOON SAPPHIRE 2.5X12.  Post intervention, there is a 50% residual stenosis.   1. Successful atherectomy and stenting of the proximal LAD with DES. No compromise of the diagonal and septal side branches. 2. Successful POBA of the LCx. Unable to cross with stent.   Plan: DAPT for at least one year. Femoral sheath will be removed manually.      Antibiotics: None  Subjective/Interval History: Patient feeling anxious this morning.  Denies chest pain.  Some shortness of breath.  No cough.  ROS: Denies any nausea or vomiting  Objective:  Vital Signs  Vitals:     06/07/17 0454 06/07/17 1457 06/07/17 2031 06/08/17 0516  BP: (!) 146/74 (!) 145/49 126/61 (!) 116/50  Pulse: 74 83 79 71  Resp: 18 18 18 18   Temp: 98.4 F (36.9 C) 98.3 F (36.8 C) 98.1 F (36.7 C) 98.2 F (36.8 C)  TempSrc: Oral Oral Oral Oral  SpO2: 96% 100% 95% 93%  Weight: 59.3 kg (130 lb 11.2 oz)   59.5 kg (131 lb 3.2 oz)  Height:        Intake/Output Summary (Last 24 hours) at 06/08/2017 0807 Last data filed at 06/08/2017 0759 Gross per 24 hour  Intake 328.52 ml  Output 700 ml  Net -371.48 ml   Filed Weights   06/06/17 0354 06/07/17 0454 06/08/17 0516  Weight: 58.8 kg (129 lb 9.6 oz) 59.3 kg (130 lb 11.2 oz) 59.5 kg (131 lb 3.2 oz)    General appearance: Awake alert.  In no distress. Resp: Good air entry bilaterally with a few crackles in the left base.  No wheezing.  No rhonchi. Cardio: S1-S2 is normal regular today.  No S3-S4.  Systolic murmur appreciated over the precordium. GI: Abdomen is soft.  Nontender nondistended.   Extremities: Some more edema noted in the lower extremities today. Neurologic: No focal deficits.  Lab Results:  Data Reviewed: I have personally reviewed following labs and imaging studies  CBC: Recent Labs  Lab 06/02/17 0732 06/03/17 0451 06/05/17 0332 06/06/17 0307 06/07/17 0524 06/08/17 0553  WBC 8.7 8.6 9.8 11.1* 9.3 7.4  NEUTROABS 4.3 4.7  --   --   --   --   HGB 12.7 11.5* 12.0 12.5 12.6 11.1*  HCT 39.9 36.4 38.4 39.8 39.5 35.5*  MCV 84.9 84.1 83.8 83.8 83.0 83.3  PLT 192 186 244 268 242 212    Basic Metabolic Panel: Recent Labs  Lab 06/03/17 0451 06/04/17 0518 06/05/17 0332 06/06/17 0307 06/07/17 0524 06/08/17 0553  NA 136 138 137 134* 134* 135  K 3.2* 4.5 3.9 4.0 4.1 4.0  CL 95* 100* 99* 96* 98* 102  CO2 32 32 31 29 27 23   GLUCOSE 81 132* 116* 132* 108* 98  BUN 48* 40* 43* 36* 32* 26*  CREATININE 1.51* 1.44* 1.51* 1.51* 1.40* 1.01*  CALCIUM 8.3* 8.4* 8.3* 8.7* 8.8* 8.7*  MG 2.1  --   --   --   --   --      GFR: Estimated Creatinine Clearance: 40 mL/min (A) (by C-G formula based on SCr of 1.01 mg/dL (H)).  Liver Function Tests: Recent Labs  Lab 06/03/17 0451 06/04/17 0518 06/05/17 0332 06/06/17 0307  AST 72* 50* 36 33  ALT 168* 138* 100* 89*  ALKPHOS 129* 127* 110 113  BILITOT 1.2 0.9 0.7 0.8  PROT 5.6* 6.0* 5.8* 6.5  ALBUMIN 2.7* 2.7* 2.8* 3.1*     CBG: Recent Labs  Lab 06/07/17 0738 06/07/17 1149 06/07/17 1645 06/07/17 2141 06/08/17 0736  GLUCAP 121* 235* 164* 171* 100*     Radiology Studies: No results found.   Medications:  Scheduled: . ALPRAZolam  0.5 mg Oral Once  . amiodarone  200 mg Oral Daily  . aspirin EC  81 mg Oral QPM  . clopidogrel  75 mg Oral Daily  . insulin aspart  0-15 Units Subcutaneous TID WC  . pregabalin  75 mg Oral QHS  .  simvastatin  20 mg Oral q1800  . sodium chloride flush  3 mL Intravenous Q12H  . sodium chloride flush  3 mL Intravenous Q12H  . sodium chloride flush  3 mL Intravenous Q12H   Continuous: . sodium chloride    . sodium chloride 250 mL (06/06/17 1900)  . sodium chloride    . sodium chloride 10 mL/hr at 06/08/17 0640  . heparin 900 Units/hr (06/07/17 1717)   NWG:NFAOZH chloride, sodium chloride, sodium chloride, acetaminophen **OR** acetaminophen, guaiFENesin, hydrALAZINE, ondansetron **OR** ondansetron (ZOFRAN) IV, ondansetron (ZOFRAN) IV, sodium chloride flush, sodium chloride flush, sodium chloride flush, sodium chloride flush, traMADol, traZODone  Assessment/Plan:  Active Problems:   CAD in native artery   Tobacco abuse   AAA (abdominal aortic aneurysm) (HCC)   Acute on chronic systolic (congestive) heart failure (HCC)   PAF (paroxysmal atrial fibrillation) (HCC)   Acute renal failure superimposed on stage 3 chronic kidney disease (HCC)   Transaminasemia   CHF (congestive heart failure) (HCC)   AKI (acute kidney injury) (HCC)    Acute on chronic systolic congestive heart failure Ejection fraction  noted to be about 15-20% based on echocardiogram done during this hospitalization.  Heart failure team continues to follow.  Patient was placed on intravenous diuretics.  She has been diuresing well.  Diuretics are on hold for cardiac catheterization/PTCI.  Her ACE inhibitor and spironolactone also held.  Not on beta-blocker due to acute decompensation.  Continue strict ins and outs daily weight.   Some weight gain is noted.  Diuretics may need to be resumed soon.  Defer to cardiology.  Elevated troponin/Coronary artery disease Thought to be secondary to CHF and acute kidney injury.  Patient did not have any chest pain.   Back in September a coronary angiogram was attempted but could not be done due to access issues.  She underwent cardiac catheterization on 12/24 which does show triple-vessel disease.  Cardiothoracic surgery was consulted however patient not a candidate for surgical intervention.  She underwent cardiac catheterization today with successful atherectomy and stenting of LAD.  Balloon angioplasty of circumflex.  Circumflex could not be stented.  Needs dual antiplatelet agents for 1 year.  Continue statin.  Previous history of bare-metal stent to LAD in 2009.  And previous intervention to RCA.  Atrial fibrillation Patient has previous history of same.  Initially was 2 atrial fibrillation.  Patient was started on intravenous amiodarone and intravenous heparin.  Appears to be in sinus rhythm with PACs this morning.  Further management deferred to cardiology.    Acute on chronic kidney disease stage III Baseline creatinine between 0.9-1.2.  Creatinine did go up as high as 1.7.  Creatinine has been improving.  Remains stable.  Continue to monitor urine output.    Transaminitis Most likely due to hepatic congestion from CHF. LFT's have significantly improved.  Almost back to baseline.  Multiple gallstones noted on abdominal ultrasound without cholecystitis.  Liver was noted to be normal in  appearance.  Hepatitis panel is negative.    Cholelithiasis Asymptomatic.  Will need outpatient monitoring.  Essential hypertension Blood pressure is reasonably well controlled.  Continue to monitor closely.    History of COPD/tobacco abuse Seems to be stable.  Tobacco cessation was discussed.  Diabetes mellitus type 2 Metformin has been held.  CBGs are reasonably well controlled.  Continue sliding scale insulin coverage.  HbA1c 7.9.  DVT Prophylaxis: On IV heparin Code Status: DNR--> Patient changed code status to Full Code today Family Communication: Discussed  with the patient Disposition Plan: Management as outlined above.  Disposition per cardiology.      LOS: 8 days   Osvaldo ShipperGokul Ophelia Sipe  [facility] Pager [phone] 06/08/2017, 8:07 AM  If 7PM-7AM, please contact night-coverage at [email or web address], password [email or web address]

## 2017-06-08 NOTE — Progress Notes (Signed)
Advanced Heart Failure Rounding Note  PCP:  Primary Cardiologist: Dr Leory PlowmanBenismhon  Subjective:    Cath 06/06/17 with 3v CAD. Seen by Dr. Donata ClayVan Trigt - not candidate for CABG with re-do sternotomy (previous ASD/VSD repairs)   Plan high-risk PCI/rotoblator of LAD today.   Feels ok. No SOB. Nervous about procedure today. Remains in AF/AFL on po amio   Coox 76.6% this am. Off lasix. Weight up 1 lb.   Objective:   Weight Range: 131 lb 3.2 oz (59.5 kg) Body mass index is 25.62 kg/m.   Vital Signs:   Temp:  [98.1 F (36.7 C)-98.3 F (36.8 C)] 98.2 F (36.8 C) (12/26 0516) Pulse Rate:  [71-83] 71 (12/26 0516) Resp:  [18] 18 (12/26 0516) BP: (116-145)/(49-61) 116/50 (12/26 0516) SpO2:  [93 %-100 %] 93 % (12/26 0516) Weight:  [131 lb 3.2 oz (59.5 kg)] 131 lb 3.2 oz (59.5 kg) (12/26 0516) Last BM Date: 06/07/17  Weight change: Filed Weights   06/06/17 0354 06/07/17 0454 06/08/17 0516  Weight: 129 lb 9.6 oz (58.8 kg) 130 lb 11.2 oz (59.3 kg) 131 lb 3.2 oz (59.5 kg)   Intake/Output:   Intake/Output Summary (Last 24 hours) at 06/08/2017 0811 Last data filed at 06/08/2017 0759 Gross per 24 hour  Intake 328.52 ml  Output 700 ml  Net -371.48 ml     Physical Exam   General: Appears chronically ill. No resp difficulty. HEENT: Normal Neck: Supple. JVP 6  Carotids 2+ bilat; no bruits. No thyromegaly or nodule noted. Cor: PMI nondisplaced. IRR, No M/G/R noted Lungs: CTAB, normal effort. Abdomen: Soft, non-tender, non-distended, no HSM. No bruits or masses. +BS  Extremities: No cyanosis, clubbing, or rash. R and LLE no edema. RUE PICC Neuro: Alert & orientedx3, cranial nerves grossly intact. moves all 4 extremities w/o difficulty. Affect pleasant   Telemetry   Afib 70-80s, personally reviewed.   EKG    N/A  Labs    CBC Recent Labs    06/07/17 0524 06/08/17 0553  WBC 9.3 7.4  HGB 12.6 11.1*  HCT 39.5 35.5*  MCV 83.0 83.3  PLT 242 212   Basic Metabolic  Panel Recent Labs    06/07/17 0524 06/08/17 0553  NA 134* 135  K 4.1 4.0  CL 98* 102  CO2 27 23  GLUCOSE 108* 98  BUN 32* 26*  CREATININE 1.40* 1.01*  CALCIUM 8.8* 8.7*   Liver Function Tests Recent Labs    06/06/17 0307  AST 33  ALT 89*  ALKPHOS 113  BILITOT 0.8  PROT 6.5  ALBUMIN 3.1*   No results for input(s): LIPASE, AMYLASE in the last 72 hours. Cardiac Enzymes No results for input(s): CKTOTAL, CKMB, CKMBINDEX, TROPONINI in the last 72 hours.  BNP: BNP (last 3 results) Recent Labs    02/14/17 0948 05/30/17 1744  BNP 1,788.7* 2,426.0*   ProBNP (last 3 results) No results for input(s): PROBNP in the last 8760 hours.  D-Dimer No results for input(s): DDIMER in the last 72 hours. Hemoglobin A1C No results for input(s): HGBA1C in the last 72 hours. Fasting Lipid Panel No results for input(s): CHOL, HDL, LDLCALC, TRIG, CHOLHDL, LDLDIRECT in the last 72 hours. Thyroid Function Tests No results for input(s): TSH, T4TOTAL, T3FREE, THYROIDAB in the last 72 hours.  Invalid input(s): FREET3  Other results:  Imaging   No results found.  Medications:    Scheduled Medications: . ALPRAZolam  0.5 mg Oral Once  . amiodarone  200 mg Oral Daily  .  aspirin EC  81 mg Oral QPM  . clopidogrel  75 mg Oral Daily  . insulin aspart  0-15 Units Subcutaneous TID WC  . pregabalin  75 mg Oral QHS  . simvastatin  20 mg Oral q1800  . sodium chloride flush  3 mL Intravenous Q12H  . sodium chloride flush  3 mL Intravenous Q12H  . sodium chloride flush  3 mL Intravenous Q12H    Infusions: . sodium chloride    . sodium chloride 250 mL (06/06/17 1900)  . sodium chloride    . sodium chloride 10 mL/hr at 06/08/17 0640  . heparin 900 Units/hr (06/07/17 1717)    PRN Medications: sodium chloride, sodium chloride, sodium chloride, acetaminophen **OR** acetaminophen, guaiFENesin, hydrALAZINE, ondansetron **OR** ondansetron (ZOFRAN) IV, ondansetron (ZOFRAN) IV, sodium chloride  flush, sodium chloride flush, sodium chloride flush, sodium chloride flush, traMADol, traZODone  Patient Profile   Charlotte Salas is a 71 y.o. female with PMH CAD (s/p stenting of RCA in 2001, BMS to LAD in 2009 with total occlusion of previously placed RCA stent noted with collateral flow present), ICM, Systolic CHF, PAD s/p aortobifemoral bypass in 02/2017, post op afib, HTN, HLD, and AAA.   [facility] worsening dyspnea. Diuresed on IV lasix. Coox 54% on 06/01/17 so [facility] and treatment by HF team.   Assessment/Plan   1. Acute on Chronic Combined Systolic and Diastolic CHF -> Biventricular failure by Echo.  - EF worsened to 10% from  EF of 30-35% by echo in 02/2017. Unclear etiology. Now with biventricular dysfunction  - Cath 12/24 with 3v CAD. Suspect iCM with possible some component of adriamycin toxicity from previous NHL therapy. Not CABG candidate.  - For hgh-risk PCI/rotoblator of LAD today +/- LCX PCI - Co-ox stable - Resume diuretics and lisinopril/spiro post cath  - Off b-blocker due to acute decompensation..    - Refuses Entresto  2. CAD/Elevated Troponin  -s/p stenting of RCA in 2001, BMS to LAD in 2009 with total occlusion of previously placed RCA stent noted with collateral flow present. - Cath 12/24 with 3v CAD - Seen by TCTs and not candidate for CABG - Plan for high-risk PCI/rotobaltor of LAD +/- LCX with Dr. [place] today.   3.PAD -s/p aortobifemoral bypass in 02/2017. Stable.   4. Paroxysmal atrial fibrillation/flutter -Had post-op AF after surgery in 02/2017.  - Recurred this admit - IV amio switched to po amio on 12/24 due to slow HRs - May need DC-CV. Continue heparin. Holding off with plans for PCI today. Possibly tomorrow or Friday  5. H/o ASD/VSD repair - Stable.   6. AKI -Creatinine improved to 1.0   7. Transaminitis/shock liver - Abdominal USshowed known gallstones but no acute findings. -  LFTs have been trending down.   7. NSVT - Goal K > 4.0 and Mg > 2.0.  - K 4.0 today.   8. H/o Non-hodgkins lmphoma - In remission.   Length of Stay: [address]8  Graciella FreerMichael Andrew Tillery, [place] JerseyPA-C  06/08/2017, 8:11 AM  Advanced Heart Failure Team Pager [phone] (M-F; 7a - 4p)  Please contact [facility] for night-coverage after hours (4p -7a ) and weekends on [email or web address]  Patient seen and examined with the above-signed Advanced Practice Provider and/or Housestaff. I personally reviewed laboratory data, imaging studies and relevant notes. I independently examined the patient and formulated the important aspects of the plan. I have edited the note to reflect any of my changes or salient  points. I have personally discussed the plan with the patient and/or family.  Stable from HF perspective. For PCI today. Can resume HF meds post-cath. Will need to address timing of DC-CV post-PCI  Arvilla Meres, MD  9:42 AM

## 2017-06-08 NOTE — Interval H&P Note (Signed)
History and Physical Interval Note:  06/08/2017 9:06 AM  Charlotte Salas  has presented today for surgery, with the diagnosis of CAD  The various methods of treatment have been discussed with the patient and family. After consideration of risks, benefits and other options for treatment, the patient has consented to  Procedure(s): CORONARY STENT INTERVENTION (N/A) as a surgical intervention .  The patient's history has been reviewed, patient examined, no change in status, stable for surgery.  I have reviewed the patient's chart and labs.  Questions were answered to the patient's satisfaction.    Cath Lab Visit (complete for each Cath Lab visit)  Clinical Evaluation Leading to the Procedure:   ACS: Yes.    Non-ACS:    Anginal Classification: CCS IV  Anti-ischemic medical therapy: No Therapy  Non-Invasive Test Results: No non-invasive testing performed  Prior CABG: No previous CABG       Theron Aristaeter [facility] PaJordanMD,FACC 06/08/2017 9:06 AM

## 2017-06-08 NOTE — Progress Notes (Signed)
   06/08/17 0900  PT Visit Information  Last PT Received On 06/07/17  Reason Eval/Treat Not Completed Patient at procedure or test/unavailable   Try later as time and pt allow.  Samul Dadauth Oriana Horiuchi, PT MS Acute Rehab Dept. Number: [facility] [phone] and [facility] [phone]

## 2017-06-09 LAB — GLUCOSE, CAPILLARY
GLUCOSE-CAPILLARY: 97 mg/dL (ref 65–99)
GLUCOSE-CAPILLARY: 144 mg/dL — AB (ref 65–99)
GLUCOSE-CAPILLARY: 173 mg/dL — AB (ref 65–99)
Glucose-Capillary: 102 mg/dL — ABNORMAL HIGH (ref 65–99)

## 2017-06-09 LAB — BASIC METABOLIC PANEL
Anion gap: 7 (ref 5–15)
BUN: 23 mg/dL — AB (ref 6–20)
CO2: 23 mmol/L (ref 22–32)
CREATININE: 1.09 mg/dL — AB (ref 0.44–1.00)
Calcium: 8.6 mg/dL — ABNORMAL LOW (ref 8.9–10.3)
Chloride: 106 mmol/L (ref 101–111)
GFR calc Af Amer: 57 mL/min — ABNORMAL LOW (ref >60)
GFR, EST NON AFRICAN AMERICAN: 49 mL/min — AB (ref >60)
GLUCOSE: 95 mg/dL (ref 65–99)
Potassium: 4.2 mmol/L (ref 3.5–5.1)
SODIUM: 136 mmol/L (ref 135–145)

## 2017-06-09 LAB — COOXEMETRY PANEL
CARBOXYHEMOGLOBIN: 1.3 % (ref 0.5–1.5)
METHEMOGLOBIN: 1.4 % (ref 0.0–1.5)
O2 SAT: 77.5 %
TOTAL HEMOGLOBIN: 10.5 g/dL — AB (ref 12.0–16.0)

## 2017-06-09 LAB — CBC
HCT: 32.7 % — ABNORMAL LOW (ref 36.0–46.0)
Hemoglobin: 10.3 g/dL — ABNORMAL LOW (ref 12.0–15.0)
MCH: 26.1 pg (ref 26.0–34.0)
MCHC: 31.5 g/dL (ref 30.0–36.0)
MCV: 82.8 fL (ref 78.0–100.0)
PLATELETS: 217 10*3/uL (ref 150–400)
RBC: 3.95 MIL/uL (ref 3.87–5.11)
RDW: 15 % (ref 11.5–15.5)
WBC: 7.2 10*3/uL (ref 4.0–10.5)

## 2017-06-09 MED ORDER — APIXABAN 5 MG PO TABS
5.0000 mg | ORAL_TABLET | Freq: Two times a day (BID) | ORAL | Status: DC
  Administered 2017-06-09 – 2017-06-10 (×3): 5 mg via ORAL
  Filled 2017-06-09 (×3): qty 1

## 2017-06-09 MED ORDER — CLOPIDOGREL BISULFATE 75 MG PO TABS
75.0000 mg | ORAL_TABLET | Freq: Every day | ORAL | Status: DC
  Administered 2017-06-09 – 2017-06-10 (×2): 75 mg via ORAL
  Filled 2017-06-09 (×2): qty 1

## 2017-06-09 MED ORDER — FUROSEMIDE 40 MG PO TABS
40.0000 mg | ORAL_TABLET | Freq: Every day | ORAL | Status: DC
  Administered 2017-06-09: 10:00:00 40 mg via ORAL
  Filled 2017-06-09 (×2): qty 1

## 2017-06-09 MED ORDER — LISINOPRIL 10 MG PO TABS
10.0000 mg | ORAL_TABLET | Freq: Every day | ORAL | Status: DC
  Administered 2017-06-09 – 2017-06-10 (×2): 10 mg via ORAL
  Filled 2017-06-09 (×2): qty 1

## 2017-06-09 MED ORDER — SIMVASTATIN 20 MG PO TABS
40.0000 mg | ORAL_TABLET | Freq: Every day | ORAL | Status: DC
  Administered 2017-06-09: 19:00:00 40 mg via ORAL
  Filled 2017-06-09: qty 2

## 2017-06-09 NOTE — Progress Notes (Signed)
[facility] PROGRESS NOTE  Charlotte Salas WUJ:[id number] DOB: [birth year] DOA: 05/30/2017  PCP: Stoney BangWinikur, Melinda, FNP  Brief History/Interval Summary: 71 year old female with past medical history of coronary artery disease, chronic systolic CHF with previously known EF of 30-35%, hypertension, diabetes mellitus, peripheral artery disease presented with 2-day history of shortness of breath, orthopnea, lower extremity edema.  Patient [place] to [place] in [place] from here she was sent over here for further management.  Patient seen by heart failure team.  She was diuresed.  She underwent cardiac catheterization which revealed triple-vessel disease.  She was evaluated by cardiothoracic surgery and not considered a candidate for surgical intervention.  She was taken back to the Cath Lab and she underwent stenting of the LAD and angioplasty of the circumflex.  Reason for Visit: Acute systolic CHF.  Atrial fibrillation  Consultants: Cardiology.  Cardiothoracic surgery.    Procedures:   Transthoracic echocardiogram Study Conclusions  - Left ventricle: The cavity size was mildly dilated. Wall   thickness was at the upper limits of normal. Systolic function   was severely reduced. The estimated ejection fraction was in the   range of 15% to 20%. Diffuse hypokinesis. There is akinesis of   the inferoseptal myocardium. The study is not technically   sufficient to allow evaluation of LV diastolic function. - Ventricular septum: Septal motion showed abnormal function and   dyssynergy. - Aortic valve: Moderately calcified annulus. Trileaflet. - Mitral valve: Mildly calcified annulus. There was moderate   regurgitation. - Left atrium: The atrium was severely dilated. - Right ventricle: The cavity size was mildly dilated. Systolic   function was moderately to severely reduced. - Right atrium: The atrium was mildly to moderately dilated.   Central venous  pressure (est): 8 mm Hg. - Tricuspid valve: There was severe regurgitation. - Pulmonary arteries: PA peak pressure: 51 mm Hg (S). - Pericardium, extracardiac: There was no pericardial effusion.  Impressions:  - Upper normal LV wall thickness with mild chamber dilatation and   LVEF approximately 15-20%. There is diffuse hypokinesis with   regional variation and akinesis of the inferoseptal wall and   septal dyssynergy. Indeterminate diastolic function, but appears   to be restrictive filling pattern. Severe left atrial   enlargement. Mildly calcified mitral annulus with at least   moderate mitral regurgitation. moderate aortic annular   calcification. Moderately dilated right ventricle with moderately   to severely reduced contraction. Mild to moderate right atrial   enlargement. Severe tricuspid regurgitation with PASP estimated   at 51 mmHg.  Cardiac catheterization 12/24 Conclusion     Mid RCA to Dist RCA lesion is 100% stenosed.  Prox RCA lesion is 70% stenosed.  Mid LM lesion is 20% stenosed.  Prox LAD lesion is 95% stenosed.  Prox Cx lesion is 95% stenosed.  Ost 1st Diag to 1st Diag lesion is 40% stenosed.   Cardiac Catheterization 12/26 Conclusion     Prox LAD lesion is 95% stenosed.  A drug-eluting stent was successfully placed using a STENT SYNERGY DES 2.5X32.  Post intervention, there is a 0% residual stenosis.  Prox Cx lesion is 95% stenosed.  Balloon angioplasty was performed using a BALLOON SAPPHIRE 2.5X12.  Post intervention, there is a 50% residual stenosis.   1. Successful atherectomy and stenting of the proximal LAD with DES. No compromise of the diagonal and septal side branches. 2. Successful POBA of the LCx. Unable to cross with stent.   Plan: DAPT for at least one  year. Femoral sheath will be removed manually.      Antibiotics: None  Subjective/Interval History: Patient denies any chest pain.  No shortness of breath.  Feels well  overall.  ROS: Denies any nausea or vomiting  Objective:  Vital Signs  Vitals:   06/09/17 0130 06/09/17 0404 06/09/17 0615 06/09/17 0724  BP: (!) 151/47 (!) 128/97 136/73   Pulse: 81 79 89 82  Resp: (!) 21 18 (!) 24   Temp: 98.1 F (36.7 C) 97.6 F (36.4 C)  (!) 97 F (36.1 C)  TempSrc: Oral Oral  Oral  SpO2: 97% 91% 92% 93%  Weight:  60 kg (132 lb 4.4 oz)    Height:        Intake/Output Summary (Last 24 hours) at 06/09/2017 1103 Last data filed at 06/09/2017 0700 Gross per 24 hour  Intake 603 ml  Output 225 ml  Net 378 ml   Filed Weights   06/07/17 0454 06/08/17 0516 06/09/17 0404  Weight: 59.3 kg (130 lb 11.2 oz) 59.5 kg (131 lb 3.2 oz) 60 kg (132 lb 4.4 oz)    General appearance: Awake alert.  No distress Resp: Clear to auscultation bilaterally.  No wheezing rales or rhonchi.  Normal effort. Cardio: S1-S2 is normal regular.  No S3-S4.  No rubs murmurs or bruit GI: Abdomen is soft.  Nontender nondistended. Extremities: Edema noted bilateral lower extremities. Neurologic: No focal deficits.  Lab Results:  Data Reviewed: I have personally reviewed following labs and imaging studies  CBC: Recent Labs  Lab 06/03/17 0451 06/05/17 0332 06/06/17 0307 06/07/17 0524 06/08/17 0553 06/09/17 0433  WBC 8.6 9.8 11.1* 9.3 7.4 7.2  NEUTROABS 4.7  --   --   --   --   --   HGB 11.5* 12.0 12.5 12.6 11.1* 10.3*  HCT 36.4 38.4 39.8 39.5 35.5* 32.7*  MCV 84.1 83.8 83.8 83.0 83.3 82.8  PLT 186 244 268 242 212 217    Basic Metabolic Panel: Recent Labs  Lab 06/03/17 0451  06/05/17 0332 06/06/17 0307 06/07/17 0524 06/08/17 0553 06/09/17 0433  NA 136   < > 137 134* 134* 135 136  K 3.2*   < > 3.9 4.0 4.1 4.0 4.2  CL 95*   < > 99* 96* 98* 102 106  CO2 32   < > 31 29 27 23 23   GLUCOSE 81   < > 116* 132* 108* 98 95  BUN 48*   < > 43* 36* 32* 26* 23*  CREATININE 1.51*   < > 1.51* 1.51* 1.40* 1.01* 1.09*  CALCIUM 8.3*   < > 8.3* 8.7* 8.8* 8.7* 8.6*  MG 2.1  --   --    --   --   --   --    < > = values in this interval not displayed.    GFR: Estimated Creatinine Clearance: 37.2 mL/min (A) (by C-G formula based on SCr of 1.09 mg/dL (H)).  Liver Function Tests: Recent Labs  Lab 06/03/17 0451 06/04/17 0518 06/05/17 0332 06/06/17 0307  AST 72* 50* 36 33  ALT 168* 138* 100* 89*  ALKPHOS 129* 127* 110 113  BILITOT 1.2 0.9 0.7 0.8  PROT 5.6* 6.0* 5.8* 6.5  ALBUMIN 2.7* 2.7* 2.8* 3.1*     CBG: Recent Labs  Lab 06/08/17 0736 06/08/17 1200 06/08/17 1711 06/08/17 2112 06/09/17 0624  GLUCAP 100* 96 243* 212* 97     Radiology Studies: No results found.   Medications:  Scheduled: . ALPRAZolam  0.5 mg Oral Once  . amiodarone  200 mg Oral Daily  . angioplasty book   Does not apply Once  . apixaban  5 mg Oral BID  . aspirin EC  81 mg Oral QPM  . clopidogrel  75 mg Oral Daily  . furosemide  40 mg Oral Daily  . insulin aspart  0-15 Units Subcutaneous TID WC  . lisinopril  10 mg Oral Daily  . pregabalin  75 mg Oral QHS  . simvastatin  40 mg Oral q1800  . sodium chloride flush  3 mL Intravenous Q12H   Continuous: . sodium chloride     WUX:LKGMWNPRN:sodium chloride, acetaminophen **OR** acetaminophen, guaiFENesin, hydrALAZINE, ondansetron **OR** ondansetron (ZOFRAN) IV, ondansetron (ZOFRAN) IV, sodium chloride flush, traMADol, traZODone  Assessment/Plan:  Active Problems:   CAD in native artery   Tobacco abuse   AAA (abdominal aortic aneurysm) (HCC)   Acute on chronic systolic (congestive) heart failure (HCC)   PAF (paroxysmal atrial fibrillation) (HCC)   Acute renal failure superimposed on stage 3 chronic kidney disease (HCC)   Transaminasemia   CHF (congestive heart failure) (HCC)   AKI (acute kidney injury) (HCC)    Acute on chronic systolic congestive heart failure Ejection fraction noted to be about 15-20% based on echocardiogram done during this hospitalization.  Heart failure team continues to follow.  Patient was placed on  intravenous diuretics.  Patient diuresed well.  Diuretics were placed on hold for cardiac catheterization.  ACE inhibitor and spironolactone were also held.  She has been placed on oral Lasix now.  ACE inhibitor has been reinitiated.  Not on beta-blocker due to acute decompensation.  Continue strict ins and outs daily weight.   Elevated troponin/Coronary artery disease Thought to be secondary to CHF and acute kidney injury.  Patient did not have any chest pain.   Back in September a coronary angiogram was attempted but could not be done due to access issues.  She underwent cardiac catheterization on 12/24 which does show triple-vessel disease.  Cardiothoracic surgery was consulted however patient not a candidate for surgical intervention.  She underwent cardiac catheterization 12/26 with successful atherectomy and stenting of LAD.  Balloon angioplasty of circumflex.  Circumflex could not be stented.  Needs dual antiplatelet treatment.  Duration to be deferred to cardiology.  Continue statin.  Previous history of bare-metal stent to LAD in 2009.  And previous intervention to RCA.  Atrial fibrillation Patient has previous history of same.  Initially was in sinus rhythm and then converted to atrial fibrillation.  Patient was started on intravenous amiodarone and intravenous heparin.  Appears to have converted back to sinus rhythm.  She is now on oral amiodarone and Eliquis.    Acute on chronic kidney disease stage III Baseline creatinine between 0.9-1.2.  Creatinine did go up as high as 1.7.  Creatinine has been improving.  Remains stable.  Continue to monitor urine output.    Transaminitis Most likely due to hepatic congestion from CHF. LFT's have significantly improved.  Almost back to baseline.  Multiple gallstones noted on abdominal ultrasound without cholecystitis.  Liver was noted to be normal in appearance.  Hepatitis panel is negative.    Cholelithiasis Asymptomatic.  Will need outpatient  monitoring.  Essential hypertension Blood pressure is reasonably well controlled.  Continue to monitor closely.    History of COPD/tobacco abuse Seems to be stable.  Tobacco cessation was discussed.  Diabetes mellitus type 2 Metformin has been held.  CBGs are reasonably well controlled.  Continue  sliding scale insulin coverage.  HbA1c 7.9.  DVT Prophylaxis: Eliquis Code Status: Patient changed CODE STATUS to full code. Family Communication: Discussed with the patient Disposition Plan: Disposition per cardiology.    LOS: 9 days   Osvaldo Shipper  [facility] Pager [phone] 06/09/2017, 11:03 AM  If 7PM-7AM, please contact night-coverage at [email or web address], password [facility]

## 2017-06-09 NOTE — Progress Notes (Signed)
Advanced Heart Failure Rounding Note  PCP:  Primary Cardiologist: Dr Leory Plowman  Subjective:    Cath 06/06/17 with 3v CAD. Seen by Dr. Donata Clay - not candidate for CABG with re-do sternotomy (previous ASD/VSD repairs)   S/p successful high-risk PCI/rotoblator of LAD 06/08/17.   Feeling good today. Would like to go home. Denies SOB.   Converted to NSR 06/08/17 on amiodarone.   Coox 76.6% this am. Off lasix. Weight up 1 lb.   Objective:   Weight Range: 132 lb 4.4 oz (60 kg) Body mass index is 25.83 kg/m.   Vital Signs:   Temp:  [97 F (36.1 C)-98.1 F (36.7 C)] 97 F (36.1 C) (12/27 0724) Pulse Rate:  [36-103] 82 (12/27 0724) Resp:  [14-36] 24 (12/27 0615) BP: (93-173)/(40-114) 136/73 (12/27 0615) SpO2:  [91 %-100 %] 93 % (12/27 0724) Weight:  [132 lb 4.4 oz (60 kg)] 132 lb 4.4 oz (60 kg) (12/27 0404) Last BM Date: 06/07/17  Weight change: Filed Weights   06/07/17 0454 06/08/17 0516 06/09/17 0404  Weight: 130 lb 11.2 oz (59.3 kg) 131 lb 3.2 oz (59.5 kg) 132 lb 4.4 oz (60 kg)   Intake/Output:   Intake/Output Summary (Last 24 hours) at 06/09/2017 1027 Last data filed at 06/09/2017 0700 Gross per 24 hour  Intake 603 ml  Output 225 ml  Net 378 ml     Physical Exam   General: Well appearing. No resp difficulty. HEENT: Normal Neck: Supple. JVP 6-7. Carotids 2+ bilat; no bruits. No thyromegaly or nodule noted. Cor: PMI nondisplaced. RRR, No M/G/R noted Lungs: CTAB, normal effort. Abdomen: Soft, non-tender, non-distended, no HSM. No bruits or masses. +BS  Extremities: No cyanosis, clubbing, or rash. R and LLE no edema. 1-2+ DP pulse. RUE PICC. Neuro: Alert & orientedx3, cranial nerves grossly intact. moves all 4 extremities w/o difficulty. Affect pleasant   Telemetry   NSR 80s, personally reviewed.   EKG    N/A  Labs    CBC Recent Labs    06/08/17 0553 06/09/17 0433  WBC 7.4 7.2  HGB 11.1* 10.3*  HCT 35.5* 32.7*  MCV 83.3 82.8  PLT 212 217    Basic Metabolic Panel Recent Labs    16/10/96 0553 06/09/17 0433  NA 135 136  K 4.0 4.2  CL 102 106  CO2 23 23  GLUCOSE 98 95  BUN 26* 23*  CREATININE 1.01* 1.09*  CALCIUM 8.7* 8.6*   Liver Function Tests No results for input(s): AST, ALT, ALKPHOS, BILITOT, PROT, ALBUMIN in the last 72 hours. No results for input(s): LIPASE, AMYLASE in the last 72 hours. Cardiac Enzymes No results for input(s): CKTOTAL, CKMB, CKMBINDEX, TROPONINI in the last 72 hours.  BNP: BNP (last 3 results) Recent Labs    02/14/17 0948 05/30/17 1744  BNP 1,788.7* 2,426.0*   ProBNP (last 3 results) No results for input(s): PROBNP in the last 8760 hours.  D-Dimer No results for input(s): DDIMER in the last 72 hours. Hemoglobin A1C No results for input(s): HGBA1C in the last 72 hours. Fasting Lipid Panel No results for input(s): CHOL, HDL, LDLCALC, TRIG, CHOLHDL, LDLDIRECT in the last 72 hours. Thyroid Function Tests No results for input(s): TSH, T4TOTAL, T3FREE, THYROIDAB in the last 72 hours.  Invalid input(s): FREET3  Other results:  Imaging   No results found.  Medications:    Scheduled Medications: . ALPRAZolam  0.5 mg Oral Once  . amiodarone  200 mg Oral Daily  . angioplasty book  Does not apply Once  . apixaban  5 mg Oral BID  . aspirin EC  81 mg Oral QPM  . clopidogrel  75 mg Oral Daily  . furosemide  40 mg Oral Daily  . insulin aspart  0-15 Units Subcutaneous TID WC  . pregabalin  75 mg Oral QHS  . simvastatin  40 mg Oral q1800  . sodium chloride flush  3 mL Intravenous Q12H    Infusions: . sodium chloride      PRN Medications: sodium chloride, acetaminophen **OR** acetaminophen, guaiFENesin, hydrALAZINE, ondansetron **OR** ondansetron (ZOFRAN) IV, ondansetron (ZOFRAN) IV, sodium chloride flush, traMADol, traZODone  Patient Profile   Riley Lammma J Turrell is a 71 y.o. female with PMH CAD (s/p stenting of RCA in 2001, BMS to LAD in 2009 with total occlusion of previously  placed RCA stent noted with collateral flow present), ICM, Systolic CHF, PAD s/p aortobifemoral bypass in 02/2017, post op afib, HTN, HLD, and AAA.   [facility] with worsening dyspnea. Diuresed on IV lasix. Coox 54% on 06/01/17 so [facility] and treatment by HF team.   Assessment/Plan   1. Acute on Chronic Combined Systolic and Diastolic CHF -> Biventricular failure by Echo.  - EF worsened to 10% from  EF of 30-35% by echo in 02/2017. Unclear etiology. Now with biventricular dysfunction  - Cath 12/24 with 3v CAD. Suspect iCM with possible some component of adriamycin toxicity from previous NHL therapy. Not CABG candidate.  - s/p atherectomy and stenting of the proximal LAD with DES and successful POBA of the LCX 06/08/17 - Co-ox 77.5% this am.  - Resume lasix 40 mg daily - Resume lisinopril 10 mg daily.  - Off b-blocker due to acute decompensation..    - Refuses Entresto  2. CAD/Elevated Troponin  -s/p stenting of RCA in 2001, BMS to LAD in 2009 with total occlusion of previously placed RCA stent noted with collateral flow present. - s/p atherectomy and stenting of the proximal LAD with DES and successful POBA of the LCX.  - Cath 12/24 with 3v CAD - Seen by TCTs and not candidate for CABG - Will need Plavix and ASA x 1 month, then will stop ASA - Will resume Eliquis for PAF as below.   3.PAD -s/p aortobifemoral bypass in 02/2017. Stable.   4. Paroxysmal atrial fibrillation/flutter -Had post-op AF after surgery in 02/2017.  - Recurred this admit - IV amio switched to po amio on 12/24 due to slow HRs - Converted to NSR s/p PCI 06/08/17.  - Resume Eliquis 5 mg BID.   5. H/o ASD/VSD repair - Stable.  6. AKI - Resolved. Creatinine stable.   7. Transaminitis/shock liver - Abdominal USshowed known gallstones but no acute findings. - LFTs have been trending down. Stable.   7. NSVT - Goal K > 4.0 and Mg > 2.0.  - K 4.2 today.    8. H/o Non-hodgkins lmphoma - In remission. No change.   Length of Stay: [address]  Graciella FreerMichael Andrew Tillery, [place] JerseyPA-C  06/09/2017, 10:27 AM  Advanced Heart Failure Team Pager [phone] (M-F; 7a - 4p)  Please contact [facility] for night-coverage after hours (4p -7a ) and weekends on [email or web address]  Patient seen and examined with the above-signed Advanced Practice Provider and/or Housestaff. I personally reviewed laboratory data, imaging studies and relevant notes. I independently examined the patient and formulated the important aspects of the plan. I have edited the note to reflect any of my changes or  salient points. I have personally discussed the plan with the patient and/or family.  Look much better today. No CP or SOB. Back in NSR on amio. Has not ambulated much. Concerned about cost of medications. Will need ASA, Eliquis and Plavix for 30 days then can drop ASA.   Wil have CM and PT see today. Hopefully home in am. D/w Dr. Rito EhrlichKrishnan.   Arvilla Meresaniel Ngai Parcell, MD  6:19 PM

## 2017-06-09 NOTE — Progress Notes (Signed)
CARDIAC REHAB PHASE I   PRE:  Rate/Rhythm: 67 SR with PVC    BP: sitting 135/42    SaO2:   MODE:  Ambulation: 450 ft   POST:  Rate/Rhythm: 102 ST with PVC    BP: sitting 155/68     SaO2:   Pt moving well with RW, no assist needed although I did use the gait belt. No major c/o, slow pace. Ed completed with pt including Plavix, HF, daily wts, low sodium, ex gl, NTG, and CRPII. Pt voiced understanding. She was not interested in discussing smoking cessation and sts that if she wants to smoke, she is going to smoke. She is not very interested in NTG either as she sts it has been expensive in the past and she has not needed it. [facility] She is planning on living with her daughter there. Pt sts she does not have drug coverage and needs generic meds. Also sts she doesn't have a CBG monitor. [phone]   Harriet MassonRandi Kristan Fleurette Salas CES, ACSM 06/09/2017 9:16 AM

## 2017-06-09 NOTE — Discharge Instructions (Addendum)

## 2017-06-09 NOTE — Progress Notes (Signed)
PT Cancellation Note  Patient Details Name: Charlotte Salas MRN: [id number] DOB: [birth year]   Cancelled Treatment:    Reason Eval/Treat Not Completed: Patient declined, no reason specified. Pt declining therapy session at this time, reporting that she ambulated in hallway earlier and was really sleepy now. PT will continue to f/u with pt as available.    Alessandra BevelsJennifer M Celeste Tavenner 06/09/2017, 12:25 PM

## 2017-06-09 NOTE — Progress Notes (Signed)
CM consulted for medication assistance. Pt starting on Eliquis. CM provided her the 30 day free card and number to call for Eliquis assistance. Pt is going to notify cardiology if unable to afford medication.  Pt does not have medication coverage through her Medicare or [employer]. CM provided her Medicare assistance number to see about adding medication coverage.  Pt uses [facility] for her medications currently and states they have been affordable using [facility].  [facility]. At d/c patient is going to her daughter's home: [address], [place], [place] [address]. CM was able to arrange [facility] to cover her HH needs.  Daughter will provide transportation to [place] at d/c.

## 2017-06-10 DIAGNOSIS — Z955 Presence of coronary angioplasty implant and graft: Secondary | ICD-10-CM

## 2017-06-10 LAB — GLUCOSE, CAPILLARY
GLUCOSE-CAPILLARY: 132 mg/dL — AB (ref 65–99)
GLUCOSE-CAPILLARY: 113 mg/dL — AB (ref 65–99)

## 2017-06-10 LAB — BASIC METABOLIC PANEL
ANION GAP: 7 (ref 5–15)
BUN: 28 mg/dL — ABNORMAL HIGH (ref 6–20)
CALCIUM: 8.7 mg/dL — AB (ref 8.9–10.3)
CO2: 26 mmol/L (ref 22–32)
Chloride: 104 mmol/L (ref 101–111)
Creatinine, Ser: 1.29 mg/dL — ABNORMAL HIGH (ref 0.44–1.00)
GFR, EST AFRICAN AMERICAN: 46 mL/min — AB (ref >60)
GFR, EST NON AFRICAN AMERICAN: 40 mL/min — AB (ref >60)
GLUCOSE: 151 mg/dL — AB (ref 65–99)
POTASSIUM: 4 mmol/L (ref 3.5–5.1)
Sodium: 137 mmol/L (ref 135–145)

## 2017-06-10 LAB — COOXEMETRY PANEL
CARBOXYHEMOGLOBIN: 1.3 % (ref 0.5–1.5)
Methemoglobin: 1.2 % (ref 0.0–1.5)
O2 Saturation: 75.1 %
Total hemoglobin: 10.1 g/dL — ABNORMAL LOW (ref 12.0–16.0)

## 2017-06-10 MED ORDER — CLOPIDOGREL BISULFATE 75 MG PO TABS: 75.0000 mg | ORAL_TABLET | Freq: Every day | ORAL | 3 refills | Status: DC

## 2017-06-10 MED ORDER — ASPIRIN EC 81 MG PO TBEC: 81.0000 mg | DELAYED_RELEASE_TABLET | Freq: Every evening | ORAL | 0 refills | Status: AC

## 2017-06-10 MED ORDER — APIXABAN 5 MG PO TABS: 5.0000 mg | ORAL_TABLET | Freq: Two times a day (BID) | ORAL | 3 refills | Status: DC

## 2017-06-10 MED ORDER — CLOPIDOGREL BISULFATE 75 MG PO TABS: 75.0000 mg | ORAL_TABLET | Freq: Every day | ORAL | 3 refills | Status: AC

## 2017-06-10 MED ORDER — LISINOPRIL 10 MG PO TABS: 10.0000 mg | ORAL_TABLET | Freq: Every day | ORAL | 0 refills | Status: AC

## 2017-06-10 MED ORDER — AMIODARONE HCL 200 MG PO TABS: 200.0000 mg | ORAL_TABLET | Freq: Every day | ORAL | 3 refills | Status: AC

## 2017-06-10 MED ORDER — SIMVASTATIN 20 MG PO TABS: 20.0000 mg | ORAL_TABLET | Freq: Every day | ORAL | 1 refills | Status: AC

## 2017-06-10 MED ORDER — AMIODARONE HCL 200 MG PO TABS: 200.0000 mg | ORAL_TABLET | Freq: Every day | ORAL | 3 refills | Status: DC

## 2017-06-10 MED ORDER — FUROSEMIDE 40 MG PO TABS: 40.0000 mg | ORAL_TABLET | Freq: Every day | ORAL | 0 refills | Status: AC

## 2017-06-10 MED ORDER — APIXABAN 5 MG PO TABS: 5.0000 mg | ORAL_TABLET | Freq: Two times a day (BID) | ORAL | 3 refills | Status: AC

## 2017-06-10 NOTE — Progress Notes (Signed)
Physical Therapy Treatment Patient Details Name: Charlotte Salas MRN: [id number] DOB: [birth year] Today's Date: 06/10/2017    History of Present Illness 71 year old female with past medical history of coronary artery disease, chronic systolic CHF with previously known EF of 30-35%, hypertension, diabetes mellitus, peripheral artery disease presented with 2-day history of shortness of breath, orthopnea, lower extremity edema.  [place] from here she was sent over here for further management. Pt is now s/p successful high-risk PCI/rotoblator of LAD 06/08/17.    PT Comments    Pt making good progress with functional mobility following her procedure with no complaints. Pt would continue to benefit from skilled physical therapy services at this time while admitted and after d/c to address the below listed limitations in order to improve overall safety and independence with functional mobility.    Follow Up Recommendations  Home health PT;Supervision/Assistance - 24 hour     Equipment Recommendations  None recommended by PT    Recommendations for Other Services       Precautions / Restrictions Precautions Precautions: Fall    Mobility  Bed Mobility Overal bed mobility: Independent                Transfers Overall transfer level: Independent                  Ambulation/Gait Ambulation/Gait assistance: Supervision Ambulation Distance (Feet): 150 Feet Assistive device: None Gait Pattern/deviations: Step-through pattern;Decreased stride length Gait velocity: decreased Gait velocity interpretation: Below normal speed for age/gender General Gait Details: mild instability but no overt LOB or need for physical assistance   Stairs            Wheelchair Mobility    Modified Rankin (Stroke Patients Only)       Balance Overall balance assessment: Needs assistance Sitting-balance support: No upper  extremity supported;Feet supported Sitting balance-Leahy Scale: Good     Standing balance support: During functional activity;No upper extremity supported Standing balance-Leahy Scale: Fair                              Cognition Arousal/Alertness: Awake/alert Behavior During Therapy: WFL for tasks assessed/performed Overall Cognitive Status: Within Functional Limits for tasks assessed                                        Exercises      General Comments        Pertinent Vitals/Pain Pain Assessment: No/denies pain    Home Living                      Prior Function            PT Goals (current goals can now be found in the care plan section) Acute Rehab PT Goals PT Goal Formulation: With patient Time For Goal Achievement: 06/17/17 Potential to Achieve Goals: Good Progress towards PT goals: Progressing toward goals    Frequency    Min 3X/week      PT Plan Current plan remains appropriate    Co-evaluation              AM-PAC PT "6 Clicks" Daily Activity  Outcome Measure  Difficulty turning over in bed (including adjusting bedclothes, sheets and blankets)?: None Difficulty moving from lying on back to sitting on the  side of the bed? : None Difficulty sitting down on and standing up from a chair with arms (e.g., wheelchair, bedside commode, etc,.)?: None Help needed moving to and from a bed to chair (including a wheelchair)?: None Help needed walking in hospital room?: A Little Help needed climbing 3-5 steps with a railing? : A Little 6 Click Score: 22    End of Session   Activity Tolerance: Patient limited by fatigue Patient left: in bed;with call bell/phone within reach Nurse Communication: Mobility status PT Visit Diagnosis: Unsteadiness on feet (R26.81);Muscle weakness (generalized) (M62.81)     Time: [id number] PT Time Calculation (min) (ACUTE ONLY): 13 min  Charges:  $Gait Training: 8-22 mins                     G Codes:       BowdonJennifer Alice Burnside, [place]PT, [place]PT [phone]    Alessandra BevelsJennifer M Azra Abrell 06/10/2017, 2:58 PM

## 2017-06-10 NOTE — Progress Notes (Signed)
Advanced Heart Failure Rounding Note  PCP:  Primary Cardiologist: Dr Leory PlowmanBenismhon  Subjective:    Cath 06/06/17 with 3v CAD. Seen by Dr. Donata ClayVan Trigt - not candidate for CABG with re-do sternotomy (previous ASD/VSD repairs)   S/p successful high-risk PCI/rotoblator of LAD 06/08/17.   Feeling great today. Anxious to go home. Denies SOB or CP. No lightheadedness ir dizziness.   Converted to NSR 06/08/17 on amiodarone.   Coox 75.1% this am. Weight stable.   Objective:   Weight Range: 132 lb 15 oz (60.3 kg) Body mass index is 25.96 kg/m.   Vital Signs:   Temp:  [97.5 F (36.4 C)-98.7 F (37.1 C)] 97.7 F (36.5 C) (12/28 0812) Pulse Rate:  [71-86] 76 (12/28 0812) Resp:  [15-24] 22 (12/28 0502) BP: (107-151)/(35-53) 130/45 (12/28 0812) SpO2:  [93 %-98 %] 95 % (12/28 0812) Weight:  [132 lb 15 oz (60.3 kg)] 132 lb 15 oz (60.3 kg) (12/28 0502) Last BM Date: 06/09/17  Weight change: Filed Weights   06/08/17 0516 06/09/17 0404 06/10/17 0502  Weight: 131 lb 3.2 oz (59.5 kg) 132 lb 4.4 oz (60 kg) 132 lb 15 oz (60.3 kg)   Intake/Output:   Intake/Output Summary (Last 24 hours) at 06/10/2017 1046 Last data filed at 06/10/2017 0700 Gross per 24 hour  Intake 483 ml  Output 1330 ml  Net -847 ml     Physical Exam   General: Well appearing. No resp difficulty. HEENT: Normal Neck: Supple. JVP 5-6. Carotids 2+ bilat; no bruits. No thyromegaly or nodule noted. Cor: PMI nondisplaced. RRR, No M/G/R noted Lungs: CTAB, normal effort. Abdomen: Soft, non-tender, non-distended, no HSM. No bruits or masses. +BS  Extremities: No cyanosis, clubbing, or rash. R and LLE no edema.  Neuro: Alert & orientedx3, cranial nerves grossly intact. moves all 4 extremities w/o difficulty. Affect pleasant   Telemetry   NSR 70-80s, personally reviewed.   EKG    No new tracings.   Labs    CBC Recent Labs    06/08/17 0553 06/09/17 0433  WBC 7.4 7.2  HGB 11.1* 10.3*  HCT 35.5* 32.7*  MCV  83.3 82.8  PLT 212 217   Basic Metabolic Panel Recent Labs    16/03/9611/27/18 0433 06/10/17 0428  NA 136 137  K 4.2 4.0  CL 106 104  CO2 23 26  GLUCOSE 95 151*  BUN 23* 28*  CREATININE 1.09* 1.29*  CALCIUM 8.6* 8.7*   Liver Function Tests No results for input(s): AST, ALT, ALKPHOS, BILITOT, PROT, ALBUMIN in the last 72 hours. No results for input(s): LIPASE, AMYLASE in the last 72 hours. Cardiac Enzymes No results for input(s): CKTOTAL, CKMB, CKMBINDEX, TROPONINI in the last 72 hours.  BNP: BNP (last 3 results) Recent Labs    02/14/17 0948 05/30/17 1744  BNP 1,788.7* 2,426.0*   ProBNP (last 3 results) No results for input(s): PROBNP in the last 8760 hours.  D-Dimer No results for input(s): DDIMER in the last 72 hours. Hemoglobin A1C No results for input(s): HGBA1C in the last 72 hours. Fasting Lipid Panel No results for input(s): CHOL, HDL, LDLCALC, TRIG, CHOLHDL, LDLDIRECT in the last 72 hours. Thyroid Function Tests No results for input(s): TSH, T4TOTAL, T3FREE, THYROIDAB in the last 72 hours.  Invalid input(s): FREET3  Other results:  Imaging   No results found.  Medications:    Scheduled Medications: . ALPRAZolam  0.5 mg Oral Once  . amiodarone  200 mg Oral Daily  . angioplasty book  Does not apply Once  . apixaban  5 mg Oral BID  . aspirin EC  81 mg Oral QPM  . clopidogrel  75 mg Oral Daily  . furosemide  40 mg Oral Daily  . insulin aspart  0-15 Units Subcutaneous TID WC  . lisinopril  10 mg Oral Daily  . pregabalin  75 mg Oral QHS  . simvastatin  40 mg Oral q1800  . sodium chloride flush  3 mL Intravenous Q12H    Infusions: . sodium chloride      PRN Medications: sodium chloride, acetaminophen **OR** acetaminophen, guaiFENesin, hydrALAZINE, ondansetron **OR** ondansetron (ZOFRAN) IV, ondansetron (ZOFRAN) IV, sodium chloride flush, traMADol, traZODone  Patient Profile   Charlotte Salas is a 71 y.o. female with PMH CAD (s/p stenting of RCA in  2001, BMS to LAD in 2009 with total occlusion of previously placed RCA stent noted with collateral flow present), ICM, Systolic CHF, PAD s/p aortobifemoral bypass in 02/2017, post op afib, HTN, HLD, and AAA.   [facility] with worsening dyspnea. Diuresed on IV lasix. Coox 54% on 06/01/17 so [facility] and treatment by HF team.   Assessment/Plan   1. Acute on Chronic Combined Systolic and Diastolic CHF -> Biventricular failure by Echo.  - EF worsened to 10% from  EF of 30-35% by echo in 02/2017. Unclear etiology. Now with biventricular dysfunction  - Cath 12/24 with 3v CAD. Suspect iCM with possible some component of adriamycin toxicity from previous NHL therapy. Not CABG candidate.  - s/p atherectomy and stenting of the proximal LAD with DES and successful POBA of the LCX 06/08/17 - Co-ox 75.1% this am.  - Continue lasix 40 mg daily.  - Continue lisinopril 10 mg daily.  - Off b-blocker due to acute decompensation..    - Refuses Entresto  2. CAD/Elevated Troponin  -s/p stenting of RCA in 2001, BMS to LAD in 2009 with total occlusion of previously placed RCA stent noted with collateral flow present. - s/p atherectomy and stenting of the proximal LAD with DES and successful POBA of the LCX.  - No s/s of ischemia.    - Cath 12/24 with 3v CAD - Seen by TCTs and not candidate for CABG - Will need Plavix and ASA x 1 month, then will stop ASA - Continue Eliquis for PAF as below.   3.PAD -s/p aortobifemoral bypass in 02/2017. Stable.   4. Paroxysmal atrial fibrillation/flutter -Had post-op AF after surgery in 02/2017.  - Recurred this admit - IV amio switched to po amio on 12/24 due to slow HRs - Converted to NSR s/p PCI 06/08/17.  - Resume Eliquis 5 mg BID.   5. H/o ASD/VSD repair - Stable.  6. AKI - Stable. No change.   7. Transaminitis/shock liver - Abdominal USshowed known gallstones but no acute findings. - LFTs have been  trending down. Stable.   7. NSVT - Goal K > 4.0 and Mg > 2.0.  - K 4.0 today. No change.   8. H/o Non-hodgkins lmphoma - In remission. No change.   HF meds for home Amiodarone 200 mg daily Eliquis 5 mg BID ASA 81 mg daily (stop after 30 days) Plavix 75 mg daily Lasix 40 mg daily Lisinopril 10 mg daily Simvastatin 40 mg daily  HF follow up made for 06/16/16   Length of Stay: [address]10  Charlotte SchoolMichael Andrew Tillery, PA-C  06/10/2017, 10:46 AM  Advanced Heart Failure Team Pager [phone] (M-F; 7a - 4p)  Please contact [facility] for night-coverage after hours (4p -7a ) and weekends on [email or web address]  Patient seen and examined with the above-signed Advanced Practice Provider and/or Housestaff. I personally reviewed laboratory data, imaging studies and relevant notes. I independently examined the patient and formulated the important aspects of the plan. I have edited the note to reflect any of my changes or salient points. I have personally discussed the plan with the patient and/or family.  She looks good today. Eager to go home. She will be on triple AC (ASA, Plavix and Eliquis) for 30 days then drop ASA. Discussed signs of bleeding to look for. Agree with d/c meds as above. I will see her in clinic next Thursday.   Arvilla Meres, MD  11:06 AM

## 2017-06-10 NOTE — Progress Notes (Signed)
CARDIAC REHAB PHASE I   PRE:  Rate/Rhythm: 86 SR    BP: sitting 130/45    SaO2:   MODE:  Ambulation: 300 ft   POST:  Rate/Rhythm: 93 SR    BP: sitting 154/57     SaO2:   Tolerated fairly well without RW. Shuffles feet but fairly steady, no LOB. Rest x1 for SOB/fatigue after 150 ft. HR stable although did have PACs every other beat briefly after walking, rate in 60s. Reviewed ed including Plavix/Eliquis/ASA. Encouraged getting her scale from home quickly so she can weigh. She has a RW at her house if she needs it. [phone]   Charlotte Salas CES, ACSM 06/10/2017 8:36 AM

## 2017-06-10 NOTE — Discharge Summary (Signed)
Discharge Summary  Charlotte Salas DOB: 02-17-1946  PCP: Stoney BangWinikur, Melinda, FNP  Admit date: 05/30/2017 Discharge date: 06/10/2017  Time spent: >7630mins  Recommendations for Outpatient Follow-up:  1. F/u with PMD within a week  for hospital discharge follow up, repeat cbc/bmp at follow up 2. F/u with cardiology/heart failure clinic  3. F/u with vascular surgery Dr Myra GianottiBrabham for PVD  Discharge Diagnoses:  Active Hospital Problems   Diagnosis Date Noted  . AKI (acute kidney injury) (HCC)   . Acute renal failure superimposed on stage 3 chronic kidney disease (HCC) 05/31/2017  . Transaminasemia 05/31/2017  . CHF (congestive heart failure) (HCC) 05/31/2017  . PAF (paroxysmal atrial fibrillation) (HCC)   . Acute on chronic systolic (congestive) heart failure (HCC)   . AAA (abdominal aortic aneurysm) (HCC) 02/09/2017  . Tobacco abuse 01/28/2012  . CAD in native artery 01/07/2009    Resolved Hospital Problems  No resolved problems to display.    Discharge Condition: stable  Diet recommendation: heart healthy/carb modified  Filed Weights   06/08/17 0516 06/09/17 0404 06/10/17 0502  Weight: 59.5 kg (131 lb 3.2 oz) 60 kg (132 lb 4.4 oz) 60.3 kg (132 lb 15 oz)    History of present illness: (per admitting MD Dr Sharl MaLama)  Charlotte Salas  is a 71 y.o. female, with history of coronary artery disease status post content, chronic systolic heart failure with EF 30 to 35%, hypertension, diabetes mellitus, paroxysmal atrial fibrillation, not on anti-globalization as it was felt that she only had postoperative atrial fibrillation, peripheral arterial disease status post aorta by femoral bypass graft who came to hospital with complaints of shortness of breath. Orthopnea. Patient [facility] she was diarist with IV Lasix. Patient says that she [place] 20 pounds weight [place] [facility]. She complains of worsening leg swelling in both lower  extremities.  She denies chest pain no nausea vomiting or diarrhea. Denies fever or chills. No abdominal pain no previous history of stroke or seizures does have a history of cervical cancer status post radiation and chemotherapy in 1972 non-Hodgkin lymphoma status post chemotherapy, in remission  in the ED, lab showed BNP 2426, troponin .06, AST 304, ALT 431, alkaline phosphatase 177    Hospital Course:  Active Problems:   CAD in native artery   Tobacco abuse   AAA (abdominal aortic aneurysm) (HCC)   Acute on chronic systolic (congestive) heart failure (HCC)   PAF (paroxysmal atrial fibrillation) (HCC)   Acute renal failure superimposed on stage 3 chronic kidney disease (HCC)   Transaminasemia   CHF (congestive heart failure) (HCC)   AKI (acute kidney injury) (HCC)   Acute on chronic systolic congestive heart failure -Ejection fraction noted to be about 15-20% based on echocardiogram done during this hospitalization.  chf though due to ischemia +/- h/o adriamycin treatment for NHL. - Patient was placed on intravenous diuretics.  Patient diuresed well.   -ACE inhibitor has been reinitiated.  Not on beta-blocker due to acute decompensation. - Continue strict ins and outs daily weight. She decline entresto. -she is euvolemic at discharge, lower extremity edema has resolved, she is on room air. Denies sob or chest pain. No hypoxia, she is cleared to discharge home by cardiology with close follow up with heart failure clinic on 1/3  Per cardiology: "HF meds for home Amiodarone 200 mg daily Eliquis 5 mg BID ASA 81 mg daily (stop after 30 days) Plavix 75 mg daily Lasix 40 mg daily Lisinopril  10 mg daily Simvastatin 40 mg daily  HF follow up made for 06/16/16 "    Elevated troponin/Coronary artery disease -Thought to be secondary to CHF and acute kidney injury.  Patient did not have any chest pain. she has Previous history of bare-metal stent to LAD in 2009.  And previous  intervention to RCA.   - Back in September a coronary angiogram was attempted but could not be done due to access issues.  She underwent cardiac catheterization on 12/24 which does show triple-vessel disease.  Cardiothoracic surgery was consulted however patient not a candidate for surgical intervention due to h/o asd/csd repair.  She underwent cardiac catheterization 12/26 with successful atherectomy and stenting of LAD.  Balloon angioplasty of circumflex.  Circumflex could not be stented.  -she is discharged on asa daily for 30 days, plavix daily. Continue statin.  -Close follow up with cardiology.  Atrial fibrillation Patient has previous history of same.  Initially was in sinus rhythm and then converted to atrial fibrillation.  Patient was started on intravenous amiodarone and intravenous heparin.  Appears to have converted back to sinus rhythm.  She is now on oral amiodarone and Eliquis.    NSVT: she is discharged on amiodarone.  PAD s/p aortobifemoral bypass in 02/2017. Stable. She is to follow up with vascular surgery.   Acute on chronic kidney disease stage III Baseline creatinine between 0.9-1.2.  Creatinine did go up as high as 1.7.  Creatinine has been improving.  Remains stable.  Continue to monitor urine output.    Transaminitis Most likely due to hepatic congestion from CHF. LFT's have significantly improved.  Almost back to baseline.  Multiple gallstones noted on abdominal ultrasound without cholecystitis.  Liver was noted to be normal in appearance.  Hepatitis panel is negative.    Cholelithiasis Asymptomatic.  Will need outpatient monitoring.  Essential hypertension Blood pressure is reasonably well controlled.  Continue to monitor closely.    History of COPD/tobacco abuse Seems to be stable.  Tobacco cessation was discussed.  Diabetes mellitus type 2 Metformin has been held in the hospital, resume at discharge.   CBGs are reasonably well controlled.  she  received  sliding scale insulin coverage while in the hospital.  HbA1c 7.9. pmd to continue to follow up.  H/o NHL, in remission  DVT Prophylaxis: Eliquis Code Status: patient wants to be DNR per my discussion with her prior to discharge. She states her family is aware of this Family Communication: Discussed with the patient Disposition Plan: cleared to discharge home  By cardiology  She reports she is going to move in with her daughter after discharge.   [address] [place], [place] [address].    Procedures:  Cardiac cath on 12/24 and 12/26  Consultations:  Cardiology  Heart failure team  Thoracic surgery  Discharge Exam: BP (!) 142/72 (BP Location: Left Arm)   Pulse 70   Temp (!) 97.2 F (36.2 C) (Oral)   Resp 19   Ht 5' (1.524 m)   Wt 60.3 kg (132 lb 15 oz)   SpO2 94%   BMI 25.96 kg/m   General: NAD Cardiovascular: RRR Respiratory: CTABL  Discharge Instructions You were cared for by a hospitalist during your hospital stay. If you have any questions about your discharge medications or the care you received while you were in the hospital after you are discharged, you can call the unit and asked to speak with the hospitalist on call if the hospitalist that took care of you  is not available. Once you are discharged, your primary care physician will handle any further medical issues. Please note that NO REFILLS for any discharge medications will be authorized once you are discharged, as it is imperative that you return to your primary care physician (or establish a relationship with a primary care physician if you do not have one) for your aftercare needs so that they can reassess your need for medications and monitor your lab values.  Discharge Instructions    Amb Referral to Cardiac Rehabilitation   Complete by:  As directed    To [facility] in [place]   Diagnosis:   Coronary Stents PTCA     Diet - low sodium heart healthy   Complete by:  As directed     Carb modified diet   Increase activity slowly   Complete by:  As directed      Allergies as of 06/10/2017      Reactions   Codeine Nausea Only      Medication List    STOP taking these medications   carvedilol 12.5 MG tablet Commonly known as:  COREG     TAKE these medications   amiodarone 200 MG tablet Commonly known as:  PACERONE Take 1 tablet (200 mg total) by mouth daily. Start taking on:  06/11/2017   apixaban 5 MG Tabs tablet Commonly known as:  ELIQUIS Take 1 tablet (5 mg total) by mouth 2 (two) times daily.   aspirin EC 81 MG tablet Take 1 tablet (81 mg total) by mouth every evening. Stop after 30days What changed:  additional instructions   clopidogrel 75 MG tablet Commonly known as:  PLAVIX Take 1 tablet (75 mg total) by mouth daily. Start taking on:  06/11/2017   Cranberry 1000 MG Caps Take 1 capsule by mouth daily.   furosemide 40 MG tablet Commonly known as:  LASIX Take 1 tablet (40 mg total) by mouth daily.   lisinopril 10 MG tablet Commonly known as:  PRINIVIL,ZESTRIL Take 1 tablet (10 mg total) by mouth daily.   metFORMIN 500 MG tablet Commonly known as:  GLUCOPHAGE Take 500 mg by mouth daily with breakfast.   nitroGLYCERIN 0.4 MG SL tablet Commonly known as:  NITROSTAT Place 1 tablet (0.4 mg total) under the tongue every 5 (five) minutes as needed for chest pain (for chest pain).   pregabalin 75 MG capsule Commonly known as:  LYRICA Take 75 mg by mouth at bedtime.   simvastatin 20 MG tablet Commonly known as:  ZOCOR Take 1 tablet (20 mg total) by mouth daily.   traMADol 50 MG tablet Commonly known as:  ULTRAM Take 1 tablet (50 mg total) by mouth every 6 (six) hours as needed.      Allergies  Allergen Reactions  . Codeine Nausea Only   Follow-up Information    [facility] Follow up.   Why:  They will contact you for the first visit. Contact information: [phone]       Dolores Patty, MD Follow up on 06/16/2017.     Specialty:  Cardiology Why:  at 1100 am for post hospital follow up. The code for parking is 9000. Psychologist, sport and exercise through [employer] off of [address]. Underground parking on your right. Can also park in lower ED lot and enter through blue awning.  Contact information: [address] [place] [address] [phone]        follow up with primary care doctor Follow up.   Why:  for hospital  discharge           The results of significant diagnostics from this hospitalization (including imaging, microbiology, ancillary and laboratory) are listed below for reference.    Significant Diagnostic Studies: Dg Chest 2 View  Result Date: 05/30/2017 CLINICAL DATA:  Shortness of breath EXAM: CHEST  2 VIEW COMPARISON:  02/14/2017 FINDINGS: Linear atelectasis or scarring at the bases. No acute consolidation or effusion. Stable cardiomegaly with aortic atherosclerosis. No pneumothorax. IMPRESSION: 1. Linear scarring or atelectasis at both lung bases. No focal pulmonary opacity is seen 2. Stable mild cardiomegaly. Electronically Signed   By: Jasmine PangKim  Fujinaga M.D.   On: 05/30/2017 17:42   [place] Abdomen Complete  Result Date: 05/31/2017 CLINICAL DATA:  Acute renal failure.  Elevated liver transaminases. EXAM: ABDOMEN ULTRASOUND COMPLETE COMPARISON:  CT, 01/24/2017 FINDINGS: Gallbladder: Multiple dependent gallstones, largest measuring 1.7 cm. Patient is tender to transducer pressure over the gallbladder. Wall measures 2.6 mm in thickness. No pericholecystic fluid. Common bile duct: Diameter: 3 mm Liver: No focal lesion identified. Within normal limits in parenchymal echogenicity. Portal vein is patent on color Doppler imaging with normal direction of blood flow towards the liver. IVC: No abnormality visualized. Pancreas: Suboptimal visualization.  Cannot accurately assess. Spleen: Size and appearance within normal limits. Right Kidney: Length: 10.2 cm. Echogenicity within normal limits. No mass or  hydronephrosis visualized. Left Kidney: Length: 8.3 cm. Normal overall parenchymal echogenicity, but not well visualized due to overlying bowel gas. No gross hydronephrosis or obvious mass. Abdominal aorta: Not well visualized. The aneurysm noted on the current CT is not appreciated sonographically. Maximum AP diameter is 2 cm. Other findings: Right pleural effusion. IMPRESSION: 1. No acute findings. No sonographic evidence of acute cholecystitis. 2. Multiple gallstones. 3. Normal sonographic appearance of the liver. 4. Pancreas mostly obscured by bowel gas. Left kidneys suboptimally assessed. 5. Known infrarenal abdominal aortic aneurysm not visualized sonographically. 6. Right pleural effusion. Electronically Signed   By: Amie Portlandavid  Ormond M.D.   On: 05/31/2017 15:00   Dg Chest Port 1 View  Result Date: 05/31/2017 CLINICAL DATA:  PICC placement. EXAM: PORTABLE CHEST 1 VIEW COMPARISON:  Frontal and lateral views yesterday. FINDINGS: Right upper extremity PICC with tip in the distal SVC. Cardiomegaly is similar allowing for differences in technique. Atherosclerosis of the thoracic aorta. Development of peribronchial thickening, fluid in the minor fissure and small right pleural effusion from prior exam. Subsegmental atelectasis or scarring at the left lung base. No pneumothorax. IMPRESSION: 1. Right upper extremity PICC tip in the distal SVC. No pneumothorax. 2. Development of pulmonary edema and right pleural effusion since yesterday, suspicious for CHF. Cardiomegaly is grossly stable. Electronically Signed   By: Rubye OaksMelanie  Ehinger M.D.   On: 05/31/2017 21:43    Microbiology: No results found for this or any previous visit (from the past 240 hour(s)).   Labs: Basic Metabolic Panel: Recent Labs  Lab 06/06/17 0307 06/07/17 0524 06/08/17 0553 06/09/17 0433 06/10/17 0428  NA 134* 134* 135 136 137  K 4.0 4.1 4.0 4.2 4.0  CL 96* 98* 102 106 104  CO2 29 27 23 23 26   GLUCOSE 132* 108* 98 95 151*  BUN 36*  32* 26* 23* 28*  CREATININE 1.51* 1.40* 1.01* 1.09* 1.29*  CALCIUM 8.7* 8.8* 8.7* 8.6* 8.7*   Liver Function Tests: Recent Labs  Lab 06/04/17 0518 06/05/17 0332 06/06/17 0307  AST 50* 36 33  ALT 138* 100* 89*  ALKPHOS 127* 110 113  BILITOT 0.9 0.7 0.8  PROT  6.0* 5.8* 6.5  ALBUMIN 2.7* 2.8* 3.1*   No results for input(s): LIPASE, AMYLASE in the last 168 hours. No results for input(s): AMMONIA in the last 168 hours. CBC: Recent Labs  Lab 06/05/17 0332 06/06/17 0307 06/07/17 0524 06/08/17 0553 06/09/17 0433  WBC 9.8 11.1* 9.3 7.4 7.2  HGB 12.0 12.5 12.6 11.1* 10.3*  HCT 38.4 39.8 39.5 35.5* 32.7*  MCV 83.8 83.8 83.0 83.3 82.8  PLT 244 268 242 212 217   Cardiac Enzymes: No results for input(s): CKTOTAL, CKMB, CKMBINDEX, TROPONINI in the last 168 hours. BNP: BNP (last 3 results) Recent Labs    02/14/17 0948 05/30/17 1744  BNP 1,788.7* 2,426.0*    ProBNP (last 3 results) No results for input(s): PROBNP in the last 8760 hours.  CBG: Recent Labs  Lab 06/09/17 1128 06/09/17 1711 06/09/17 2123 06/10/17 0640 06/10/17 1137  GLUCAP 144* 102* 173* 132* 113*       Signed:  Albertine Grates MD, PhD  [facility] 06/10/2017, 12:58 PM

## 2017-06-11 DIAGNOSIS — I5023 Acute on chronic systolic (congestive) heart failure: Secondary | ICD-10-CM

## 2017-06-11 DIAGNOSIS — I2510 Atherosclerotic heart disease of native coronary artery without angina pectoris: Secondary | ICD-10-CM

## 2017-06-11 DIAGNOSIS — I130 Hypertensive heart and chronic kidney disease with heart failure and stage 1 through stage 4 chronic kidney disease, or unspecified chronic kidney disease: Secondary | ICD-10-CM

## 2017-06-12 ENCOUNTER — Telehealth: Payer: Self-pay | Admitting: Cardiology

## 2017-06-12 NOTE — Telephone Encounter (Signed)
Pt's daughter called that her mother had had 5 semi formed stools today and was purplish in color.  She was seen in ER yesterday but no direction.  Pt with new DES to LAD and discharged on plavix asa 81 and eliquis.  D/c'd on the 28th.  I have asked them to hold eliquis and to go back to hospital.  They are two hours from here so [facility].  I am worried with 5 stools today.  Her last Hgb here was 10.3.  On Eliquis for PAF,  She is on amiodarone.  Additionally EF is 10%  Very frail pt that needs to be evaluated.

## 2017-06-15 MED ORDER — FUROSEMIDE 40 MG PO TABS
40.00 | ORAL_TABLET | ORAL | Status: DC
Start: 2017-06-16 — End: 2017-06-15

## 2017-06-15 MED ORDER — SIMVASTATIN 20 MG PO TABS
20.00 | ORAL_TABLET | ORAL | Status: DC
Start: 2017-06-15 — End: 2017-06-15

## 2017-06-15 MED ORDER — ASPIRIN EC 81 MG PO TBEC
81.00 | DELAYED_RELEASE_TABLET | ORAL | Status: DC
Start: 2017-06-16 — End: 2017-06-15

## 2017-06-15 MED ORDER — TRAMADOL HCL 50 MG PO TABS
50.00 | ORAL_TABLET | ORAL | Status: DC
Start: ? — End: 2017-06-15

## 2017-06-15 MED ORDER — DEXTROSE 50 % IV SOLN
12.50 | INTRAVENOUS | Status: DC
Start: ? — End: 2017-06-15

## 2017-06-15 MED ORDER — GLUCAGON HCL RDNA (DIAGNOSTIC) 1 MG IJ SOLR
1.00 | INTRAMUSCULAR | Status: DC
Start: ? — End: 2017-06-15

## 2017-06-15 MED ORDER — ONDANSETRON HCL 4 MG/2ML IJ SOLN
4.00 | INTRAMUSCULAR | Status: DC
Start: ? — End: 2017-06-15

## 2017-06-15 MED ORDER — CLOPIDOGREL BISULFATE 75 MG PO TABS
75.00 | ORAL_TABLET | ORAL | Status: DC
Start: 2017-06-16 — End: 2017-06-15

## 2017-06-15 MED ORDER — LORAZEPAM 0.5 MG PO TABS
.50 | ORAL_TABLET | ORAL | Status: DC
Start: ? — End: 2017-06-15

## 2017-06-15 MED ORDER — LISINOPRIL 5 MG PO TABS
5.00 | ORAL_TABLET | ORAL | Status: DC
Start: 2017-06-16 — End: 2017-06-15

## 2017-06-15 MED ORDER — AMIODARONE HCL 200 MG PO TABS
200.00 | ORAL_TABLET | ORAL | Status: DC
Start: 2017-06-16 — End: 2017-06-15

## 2017-06-15 MED ORDER — PANTOPRAZOLE SODIUM 40 MG PO TBEC
40.00 | DELAYED_RELEASE_TABLET | ORAL | Status: DC
Start: 2017-06-15 — End: 2017-06-15

## 2017-06-16 ENCOUNTER — Encounter (HOSPITAL_COMMUNITY): Payer: Medicare Other | Admitting: Internal Medicine

## 2017-06-29 ENCOUNTER — Ambulatory Visit: Payer: Medicare Other | Admitting: Cardiology

## 2017-06-30 ENCOUNTER — Telehealth (HOSPITAL_COMMUNITY): Payer: Self-pay | Admitting: Internal Medicine

## 2017-06-30 NOTE — Telephone Encounter (Signed)
Left patient VM to call our office.  Need to reschedule hospital f/u appt pt cancelled 06/16/17 per staff message from Meredith StaggersHeather Schub, RN.

## 2017-07-08 ENCOUNTER — Encounter (HOSPITAL_COMMUNITY): Payer: Self-pay | Admitting: Internal Medicine

## 2017-07-11 ENCOUNTER — Ambulatory Visit: Payer: Medicare Other | Admitting: Surgery

## 2017-07-11 ENCOUNTER — Encounter (HOSPITAL_COMMUNITY): Payer: Medicare Other

## 2017-07-12 ENCOUNTER — Telehealth: Payer: Self-pay | Admitting: Surgery

## 2017-07-12 NOTE — Telephone Encounter (Signed)
-----   Message from Nada LibmanVance W Brabham, MD sent at 07/11/2017  6:04 PM EST ----- Regarding: RE: Vascular Surgery Recommendation Contact: [phone] CooperBobby Mendes at [facility] [place] or Bennie Pier[facility]Mark Farber at [facility].  [facility] well [facility] ----- Message ----- From: Rudean Haskelleaves, Tyja Gortney M Sent: 07/08/2017   9:55 AM To: Nada LibmanVance W Brabham, MD, [facility] Admin Pool Subject: Vascular Surgery Recommendation                Pt [place] moved [place] with [place] in the [place] area, do you have a vascular surgery recommendation in that area?  Juliette AlcideMelinda

## 2017-08-02 ENCOUNTER — Ambulatory Visit: Payer: Medicare Other | Admitting: Cardiology

## 2017-08-08 ENCOUNTER — Ambulatory Visit: Payer: Medicare Other | Admitting: Surgery

## 2019-01-02 IMAGING — CT CT CTA ABD/PEL W/CM AND/OR W/O CM
2 of 7 series · 12 of 46 positions shown, 14 images · IV contrast (APPLIED)
Comparison: None.

CLINICAL DATA: Peripheral arterial disease.

EXAM:
CT ANGIOGRAPHY CHEST, ABDOMEN AND PELVIS
TECHNIQUE: Multidetector CT imaging through the chest, abdomen and pelvis was
performed using the standard protocol during bolus administration of
intravenous contrast. Multiplanar reconstructed images and MIPs were
obtained and reviewed to evaluate the vascular anatomy.
CONTRAST:  100 mL of Isovue 370 intravenously.

[Series 4: cap (person_name) 3.0 b31s · axial · 0.72mm/px · z∈[-626,-125]mm · 9 of 207 slices shown, 11 images]
[im 20/207  soft-tissue]
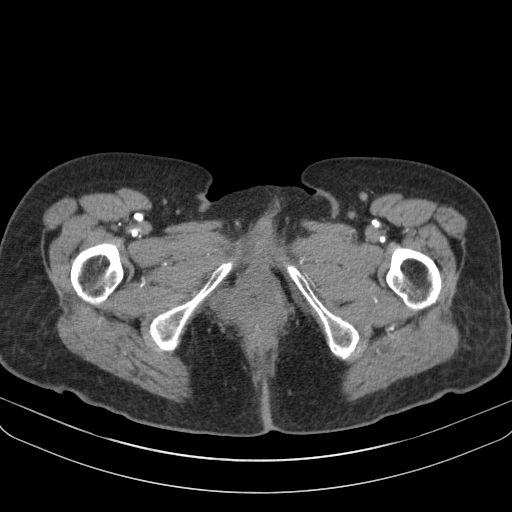
[im 20/207  bone]
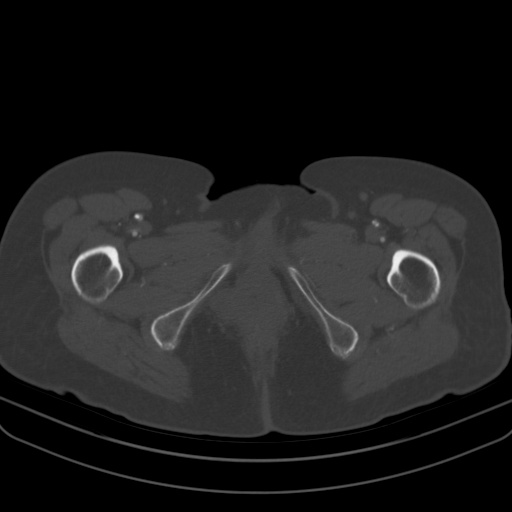
[im 40/207  soft-tissue]
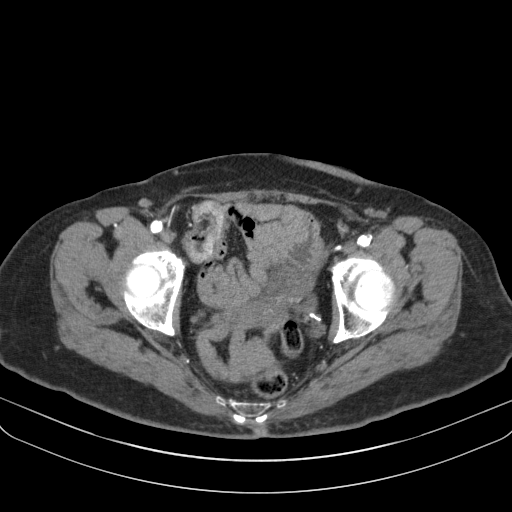
[im 59/207  soft-tissue]
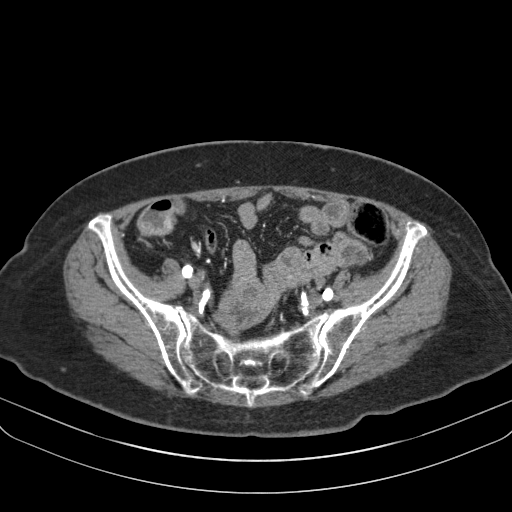
[im 79/207  soft-tissue]
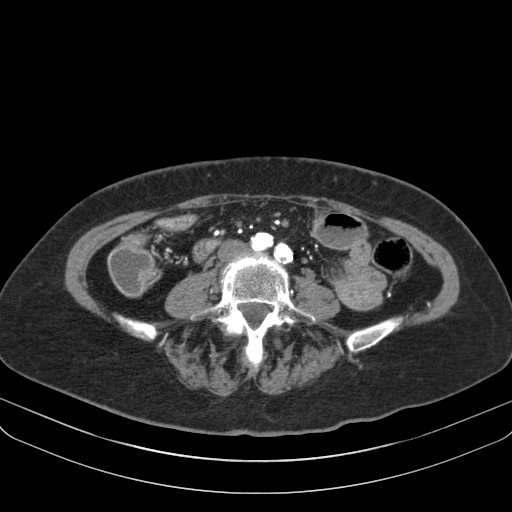
[im 108/207  soft-tissue]
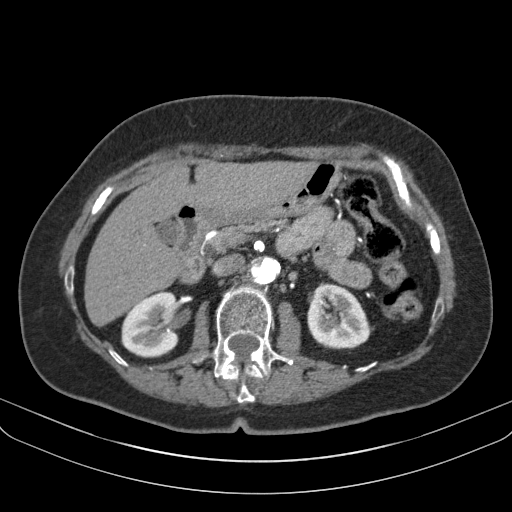
[im 128/207  soft-tissue]
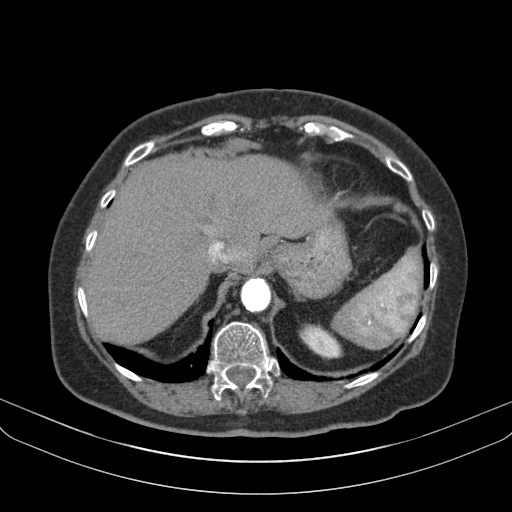
[im 148/207  soft-tissue]
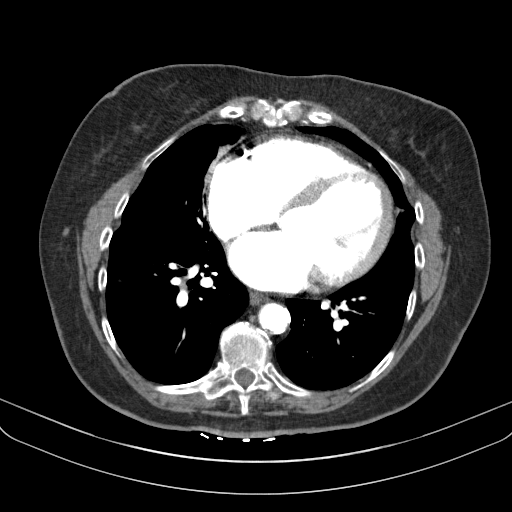
[im 167/207  soft-tissue]
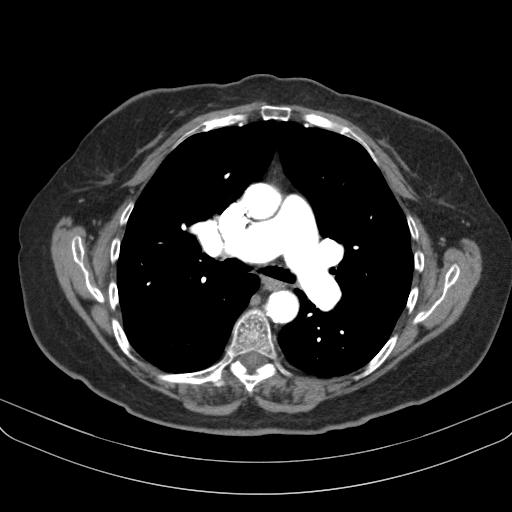
[im 187/207  soft-tissue]
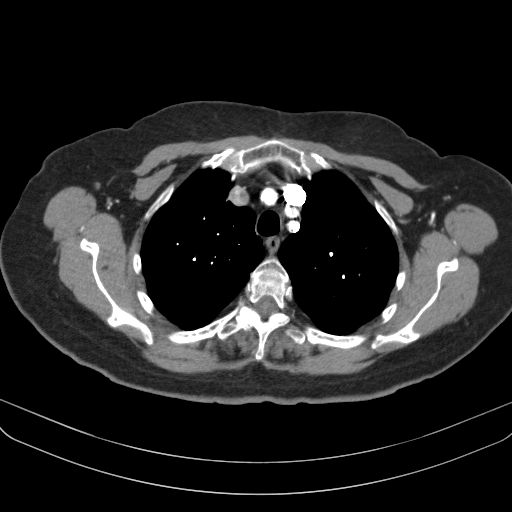
[im 187/207  bone]
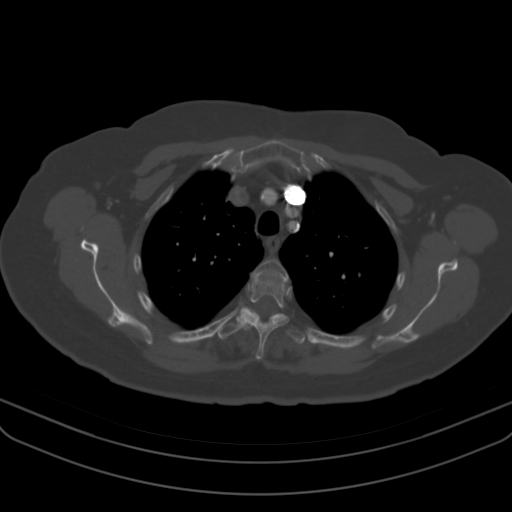

[Series 5: cap (person_name) 3.0 cor cor cor · coronal · 0.70mm/px · 3 of 87 slices shown]
[im 22/87  soft-tissue]
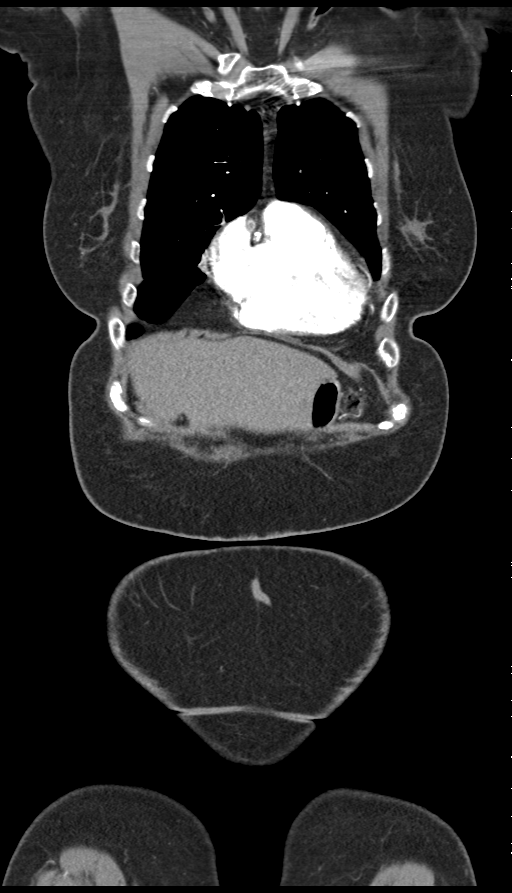
[im 44/87  soft-tissue]
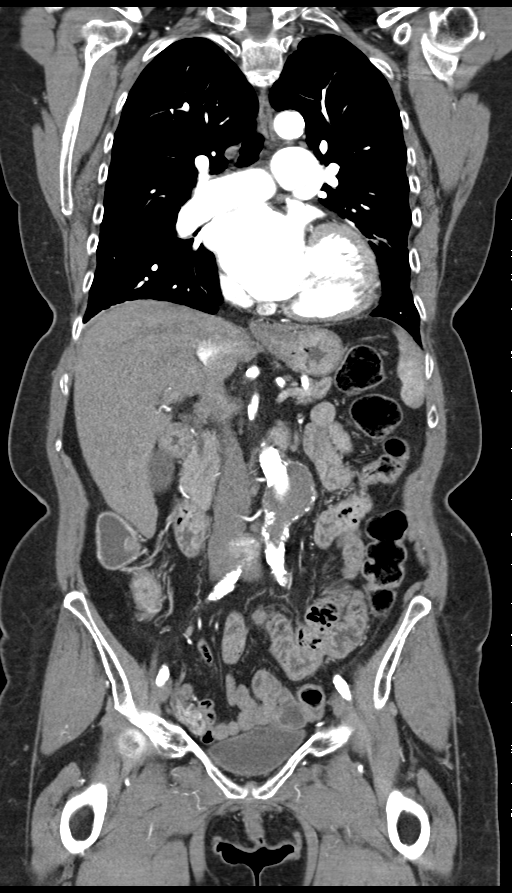
[im 65/87  soft-tissue]
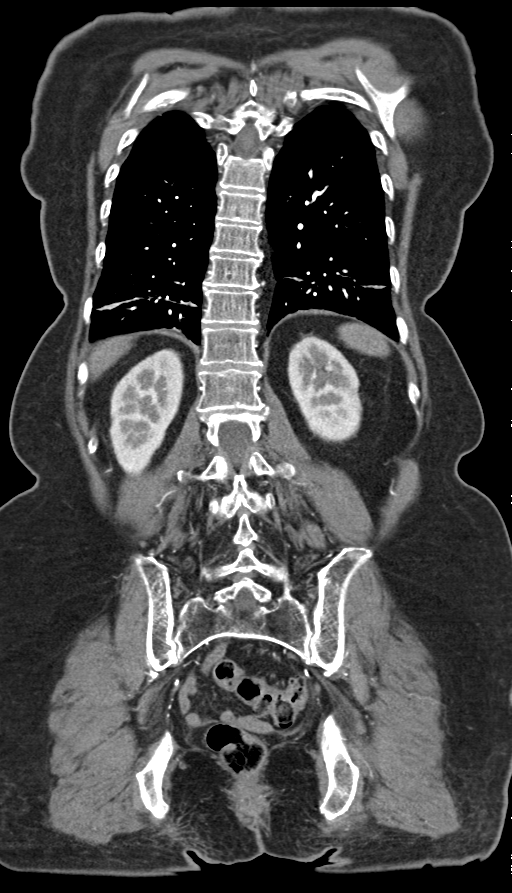

[12 of 46 positions shown; findings below may reference images not displayed]

FINDINGS: CTA CHEST FINDINGS

Cardiovascular: Atherosclerosis of thoracic aorta is noted without
aneurysm or dissection. Pulmonary arteries appear grossly normal.
Great vessels are widely patent without significant stenosis.
Coronary artery calcifications are noted. No pericardial effusion is
noted. Mild cardiomegaly is noted.

Mediastinum/Nodes: No enlarged mediastinal, hilar, or axillary lymph
nodes. Thyroid gland, trachea, and esophagus demonstrate no
significant findings.

Lungs/Pleura: No pneumothorax or pleural effusion is noted. Mild
bibasilar scarring or subsegmental atelectasis is noted. 5 mm
irregular nodule is noted posteriorly in the left lung apex best
seen on image number 9 of series 8. 6 mm nodule is seen in left
upper lobe best seen on image number 12 of series 8.

Musculoskeletal: No chest wall abnormality. No acute or significant
osseous findings.

Review of the MIP images confirms the above findings.

CTA ABDOMEN AND PELVIS FINDINGS

VASCULAR

Aorta: Atherosclerosis of abdominal aorta is noted with 3.9 cm
infrarenal abdominal aortic aneurysm. No dissection is noted.

Celiac: Patent without evidence of aneurysm, dissection, vasculitis
or significant stenosis.

SMA: Calcified plaque is noted at the origin resulting in moderate
focal stenosis.

Renals: Calcified plaque is noted at the origins of both renal
arteries resulting in severe focal stenosis.

IMA: Occluded due to aneurysm.

Inflow: Atherosclerosis of iliac arteries is noted with moderate to
severe focal stenosis seen in the proximal right external iliac
artery. Moderate to severe focal stenosis is also noted in the
proximal and distal left external iliac artery.

Veins: No obvious venous abnormality within the limitations of this
arterial phase study.

Review of the MIP images confirms the above findings.

NON-VASCULAR

Hepatobiliary: No focal liver abnormality is seen. No gallstones,
gallbladder wall thickening, or biliary dilatation.

Pancreas: Unremarkable. No pancreatic ductal dilatation or
surrounding inflammatory changes.

Spleen: Normal in size without focal abnormality.

Adrenals/Urinary Tract: Adrenal glands are unremarkable. Kidneys are
normal, without renal calculi, focal lesion, or hydronephrosis.
Bladder is unremarkable.

Stomach/Bowel: Stomach is within normal limits. Appendix appears
normal. No evidence of bowel wall thickening, distention, or
inflammatory changes.

Lymphatic: No significant adenopathy is noted.

Reproductive: Uterus and bilateral adnexa are unremarkable.

Other: No abdominal wall hernia or abnormality. No abdominopelvic
ascites.

Musculoskeletal: No acute or significant osseous findings.

Review of the MIP images confirms the above findings.
IMPRESSION: Aortic atherosclerosis.

Coronary artery calcifications are noted suggesting coronary artery
disease.

5 mm nodule seen in left lung apex, with another 6 mm nodule seen in
left upper lobe. Initial follow-up with CT at 6-12 months is
recommended to confirm persistence. If persistent, repeat CT is
recommended every 2 years until 5 years of stability has been
established. This recommendation follows the consensus statement:
Guidelines for Management of Incidental Pulmonary Nodules Detected
[DATE].

3.9 cm infrarenal abdominal aortic aneurysm is noted. Recommend
followup by ultrasound in 2 years. This recommendation follows ACR
consensus guidelines: White Paper of the ACR Incidental Findings
Committee II on Vascular Findings. [HOSPITAL] 8445;
[DATE].

Calcified plaque noted at origin of superior mesenteric artery
resulting in moderate focal stenosis. Inferior mesenteric artery
appears to be occluded due to aneurysm.

Severe focal stenosis is noted at the origin of each renal artery
secondary to calcified plaque.

Atherosclerotic calcifications are also noted in the iliac arteries,
with moderate to severe focal stenosis noted in the proximal portion
of each external iliac artery. Moderate to severe focal stenosis is
also noted in the distal portion of the left external iliac artery.

## 2019-05-15 DEATH — deceased
